# Patient Record
Sex: Female | Born: 1975 | Race: White | Hispanic: No | Marital: Married | Smoking: Former smoker
Health system: Southern US, Community
[De-identification: ages and names within clinical notes are randomized; demographics above are authoritative.]

## PROBLEM LIST (undated history)

## (undated) ENCOUNTER — Ambulatory Visit: Admission: EM | Disposition: A | Discharge status: Home / Self Care | Payer: 59

## (undated) ENCOUNTER — Inpatient Hospital Stay: Payer: Self-pay

## (undated) DIAGNOSIS — T4145XA Adverse effect of unspecified anesthetic, initial encounter: Secondary | ICD-10-CM

## (undated) DIAGNOSIS — T7840XA Allergy, unspecified, initial encounter: Secondary | ICD-10-CM

## (undated) DIAGNOSIS — F419 Anxiety disorder, unspecified: Secondary | ICD-10-CM

## (undated) DIAGNOSIS — Z9889 Other specified postprocedural states: Secondary | ICD-10-CM

## (undated) DIAGNOSIS — F329 Major depressive disorder, single episode, unspecified: Secondary | ICD-10-CM

## (undated) DIAGNOSIS — T8859XA Other complications of anesthesia, initial encounter: Secondary | ICD-10-CM

## (undated) DIAGNOSIS — D329 Benign neoplasm of meninges, unspecified: Secondary | ICD-10-CM

## (undated) DIAGNOSIS — F32A Depression, unspecified: Secondary | ICD-10-CM

## (undated) DIAGNOSIS — K219 Gastro-esophageal reflux disease without esophagitis: Secondary | ICD-10-CM

## (undated) DIAGNOSIS — R112 Nausea with vomiting, unspecified: Secondary | ICD-10-CM

## (undated) DIAGNOSIS — I10 Essential (primary) hypertension: Secondary | ICD-10-CM

## (undated) HISTORY — DX: Allergy, unspecified, initial encounter: T78.40XA

## (undated) HISTORY — DX: Anxiety disorder, unspecified: F41.9

## (undated) HISTORY — PX: BRAIN SURGERY: SHX531

## (undated) HISTORY — DX: Major depressive disorder, single episode, unspecified: F32.9

## (undated) HISTORY — PX: OTHER SURGICAL HISTORY: SHX169

## (undated) HISTORY — PX: CORONARY ARTERY BYPASS GRAFT: SHX141

## (undated) HISTORY — DX: Depression, unspecified: F32.A

## (undated) HISTORY — PX: ABDOMINAL HYSTERECTOMY: SHX81

## (undated) SURGERY — Surgical Case
Anesthesia: *Unknown

---

## 1998-04-18 HISTORY — PX: TUBAL LIGATION: SHX77

## 2000-09-13 ENCOUNTER — Ambulatory Visit (HOSPITAL_COMMUNITY): Admission: RE | Admit: 2000-09-13 | Discharge: 2000-09-13 | Payer: Self-pay | Admitting: Family Medicine

## 2001-05-27 ENCOUNTER — Emergency Department (HOSPITAL_COMMUNITY): Admission: EM | Admit: 2001-05-27 | Discharge: 2001-05-27 | Payer: Self-pay

## 2004-11-10 ENCOUNTER — Emergency Department: Payer: Self-pay | Admitting: General Practice

## 2005-09-06 ENCOUNTER — Ambulatory Visit: Payer: Self-pay | Admitting: Obstetrics & Gynecology

## 2014-07-25 ENCOUNTER — Encounter: Payer: Self-pay | Admitting: *Deleted

## 2014-10-13 ENCOUNTER — Encounter: Payer: Self-pay | Admitting: Family Medicine

## 2014-10-13 ENCOUNTER — Ambulatory Visit (INDEPENDENT_AMBULATORY_CARE_PROVIDER_SITE_OTHER): Payer: 59 | Admitting: Family Medicine

## 2014-10-13 VITALS — BP 100/64 | HR 60 | Temp 98.1°F | Resp 16 | Ht 66.0 in | Wt 181.0 lb

## 2014-10-13 DIAGNOSIS — R300 Dysuria: Secondary | ICD-10-CM

## 2014-10-13 DIAGNOSIS — F32A Depression, unspecified: Secondary | ICD-10-CM | POA: Insufficient documentation

## 2014-10-13 DIAGNOSIS — M722 Plantar fascial fibromatosis: Secondary | ICD-10-CM

## 2014-10-13 DIAGNOSIS — F329 Major depressive disorder, single episode, unspecified: Secondary | ICD-10-CM | POA: Insufficient documentation

## 2014-10-13 LAB — POCT URINALYSIS DIPSTICK
Bilirubin, UA: NEGATIVE
Blood, UA: NEGATIVE
Glucose, UA: NEGATIVE
Ketones, UA: NEGATIVE
Leukocytes, UA: NEGATIVE
Nitrite, UA: NEGATIVE
Protein, UA: NEGATIVE
Spec Grav, UA: 1.015
Urobilinogen, UA: 1
pH, UA: 6

## 2014-10-13 NOTE — Progress Notes (Signed)
Subjective:     Patient ID: Carol Mooney, female   DOB: [birth year], 39 y.o.   MRN: [id number]  HPI  Chief Complaint  Patient presents with  . Foot Pain    patient presents in office today with complaints of right foot pain for the past month and half, patient states that pain has been gradually getting worse and is located at heel of foot and on side of foot, no injury or accident related to pain  . Dysuria    patient states that she has had burning with urination for over the past several weeks.   States she drinks a lot of caffeine and has gained weight since stopping smoking. 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 Reports foot pain bothers even if she is not weight bearing. Has tried high dose ibuprofen and shoe inserts with little improvement.   Review of Systems  Constitutional: Negative for fever and chills.       Objective:   Physical Exam  Constitutional: She appears well-developed and well-nourished. No distress.  Cardiovascular:  Right pedal pulses intact  Musculoskeletal: She exhibits no edema.  Tender over her right plantar heel         Assessment:    1. Plantar fasciitis of right foot - Ambulatory referral to Podiatry  2. Dysuria - POCT urinalysis dipstick    Plan:    Decrease caffeine intake. May add ES Tylenol up to 3000 mg./day for pain relief esp.  at night pending podiatry referral.

## 2014-10-13 NOTE — Patient Instructions (Signed)
Add extra strength Tylenol up to 3000 mg./ day for pain. Decrease caffeine intake.

## 2014-11-11 ENCOUNTER — Encounter: Payer: Self-pay | Admitting: Family Medicine

## 2014-11-11 ENCOUNTER — Ambulatory Visit (INDEPENDENT_AMBULATORY_CARE_PROVIDER_SITE_OTHER): Payer: 59 | Admitting: Family Medicine

## 2014-11-11 VITALS — BP 102/70 | HR 71 | Temp 97.9°F | Resp 16 | Ht 65.0 in | Wt 178.2 lb

## 2014-11-11 DIAGNOSIS — R635 Abnormal weight gain: Secondary | ICD-10-CM | POA: Diagnosis not present

## 2014-11-11 DIAGNOSIS — R5382 Chronic fatigue, unspecified: Secondary | ICD-10-CM | POA: Diagnosis not present

## 2014-11-11 NOTE — Progress Notes (Signed)
Subjective:     Patient ID: Carol Mooney, female   DOB: [birth year], 39 y.o.   MRN: [id number]  HPI  Chief Complaint  Patient presents with  . Fatigue    Patient comes in office today to address concerns of fatigue. Patient reports that she has been dealing with this issue on/off for some time now and was unsure if it was related to her depression. Patient states that she sleeps up to 16hrs.   . Weight Gain    Patient would like to address recent weight gain in the past 3 months, patient reports that she has gone up 2 pant sizes in 90days and is unsure why. Patient states that she stays active and eating habits have not changed.   "I quit Cymbalta 3 months ago." States it was making her too sleepy. Denies significant depression. Concerned with increased weight despite working out at the gym for an hour 3 x week. States she has been experiencing swelling of her legs as well. 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 Accompanied by her daughter today.   Review of Systems  Respiratory:       Reports snoring  Genitourinary:       Reports heavy menses; hx of tubal ligation.  Psychiatric/Behavioral:       States she was on Zoloft for several years from the age of 30. Reports it quit working for her despite high dose.       Objective:   Physical Exam  Constitutional: She appears well-developed and well-nourished. No distress.  Cardiovascular: Normal rate and regular rhythm.   Pulmonary/Chest: Breath sounds normal.  Musculoskeletal: She exhibits no edema (of lower extremties).  Psychiatric: She has a normal mood and affect. Her behavior is normal.       Assessment:    1. Chronic fatigue - CBC with Differential/Platelet - Comprehensive metabolic panel - T4, free - TSH - Ferritin - Vit D  25 hydroxy   2. Weight gain     Plan:    Provided with Epworth screen to fill out with her husband. Further f/u pending labs. Consider trial on Wellbutrin.

## 2014-11-11 NOTE — Patient Instructions (Signed)
Complete Epworth screen with your husband. We will call you with your lab results.

## 2014-11-12 ENCOUNTER — Telehealth: Payer: Self-pay

## 2014-11-12 ENCOUNTER — Other Ambulatory Visit: Payer: Self-pay | Admitting: Family Medicine

## 2014-11-12 DIAGNOSIS — E559 Vitamin D deficiency, unspecified: Secondary | ICD-10-CM

## 2014-11-12 LAB — COMPREHENSIVE METABOLIC PANEL
ALT: 11 IU/L (ref 0–32)
AST: 10 IU/L (ref 0–40)
Albumin/Globulin Ratio: 2 (ref 1.1–2.5)
Albumin: 4.3 g/dL (ref 3.5–5.5)
Alkaline Phosphatase: 51 IU/L (ref 39–117)
BUN/Creatinine Ratio: 17 (ref 8–20)
BUN: 11 mg/dL (ref 6–20)
Bilirubin Total: 0.2 mg/dL (ref 0.0–1.2)
CO2: 23 mmol/L (ref 18–29)
Calcium: 9.4 mg/dL (ref 8.7–10.2)
Chloride: 102 mmol/L (ref 97–108)
Creatinine, Ser: 0.63 mg/dL (ref 0.57–1.00)
GFR calc Af Amer: 131 mL/min/{1.73_m2} (ref >59)
GFR calc non Af Amer: 113 mL/min/{1.73_m2} (ref >59)
Globulin, Total: 2.2 g/dL (ref 1.5–4.5)
Glucose: 88 mg/dL (ref 65–99)
Potassium: 4.6 mmol/L (ref 3.5–5.2)
Sodium: 140 mmol/L (ref 134–144)
Total Protein: 6.5 g/dL (ref 6.0–8.5)

## 2014-11-12 LAB — CBC WITH DIFFERENTIAL/PLATELET
Basophils Absolute: 0 10*3/uL (ref 0.0–0.2)
Basos: 1 %
EOS (ABSOLUTE): 0.1 10*3/uL (ref 0.0–0.4)
Eos: 2 %
Hematocrit: 39.3 % (ref 34.0–46.6)
Hemoglobin: 13 g/dL (ref 11.1–15.9)
Immature Grans (Abs): 0 10*3/uL (ref 0.0–0.1)
Immature Granulocytes: 0 %
Lymphocytes Absolute: 1.9 10*3/uL (ref 0.7–3.1)
Lymphs: 30 %
MCH: 30.8 pg (ref 26.6–33.0)
MCHC: 33.1 g/dL (ref 31.5–35.7)
MCV: 93 fL (ref 79–97)
Monocytes Absolute: 0.5 10*3/uL (ref 0.1–0.9)
Monocytes: 7 %
Neutrophils Absolute: 3.7 10*3/uL (ref 1.4–7.0)
Neutrophils: 60 %
Platelets: 303 10*3/uL (ref 150–379)
RBC: 4.22 x10E6/uL (ref 3.77–5.28)
RDW: 14.6 % (ref 12.3–15.4)
WBC: 6.2 10*3/uL (ref 3.4–10.8)

## 2014-11-12 LAB — T4, FREE: Free T4: 1.1 ng/dL (ref 0.82–1.77)

## 2014-11-12 LAB — TSH: TSH: 0.954 u[IU]/mL (ref 0.450–4.500)

## 2014-11-12 LAB — FERRITIN: Ferritin: 14 ng/mL — ABNORMAL LOW (ref 15–150)

## 2014-11-12 LAB — VITAMIN D 25 HYDROXY (VIT D DEFICIENCY, FRACTURES): Vit D, 25-Hydroxy: 12.7 ng/mL — ABNORMAL LOW (ref 30.0–100.0)

## 2014-11-12 MED ORDER — VITAMIN D (ERGOCALCIFEROL) 1.25 MG (50000 UNIT) PO CAPS: 50000.0000 [IU] | ORAL_CAPSULE | ORAL | Status: DC

## 2014-11-12 NOTE — Telephone Encounter (Signed)
[id number] 

## 2014-11-12 NOTE — Telephone Encounter (Signed)
-----   Message from Carmon Ginsberg, [place] sent at 11/12/2014  7:57 AM EDT ----- No anemia though iron stores are mildly low. Also Vitamin D is very low. Thyroid tests and remainder of metabolic profile is normal. Would recommend an iron sulfate 300-325 mg.daily for 6 weeks and prescription dose Vitamin D weekly for 12 weeks (I will send in). Continue going to the gym as you are doing but walk for 30 minutes at least on days you are not going to the gym. Recheck in 3 months.

## 2014-11-12 NOTE — Telephone Encounter (Signed)
Spoke with patient on phone and advised as below. Patient stated that at office visit you mentioned that if her "levels" were within normal range that you would consider changing antidepressant medication? Patient wants to know is that something that can be done or does she need to come back in office to discuss changing medication? Also in message below you had stated that you recommend patient to start iron sulfate 300-325mg , is this something patient can purchase over the counter? I wasn't sure because I didn't see Rx for it in chart. Please advise, Amparo Bristol

## 2014-11-13 ENCOUNTER — Other Ambulatory Visit: Payer: Self-pay | Admitting: Family Medicine

## 2014-11-13 DIAGNOSIS — F329 Major depressive disorder, single episode, unspecified: Secondary | ICD-10-CM

## 2014-11-13 DIAGNOSIS — F32A Depression, unspecified: Secondary | ICD-10-CM

## 2014-11-13 MED ORDER — BUPROPION HCL ER (SR) 150 MG PO TB12: 150.0000 mg | ORAL_TABLET | Freq: Two times a day (BID) | ORAL | Status: DC

## 2014-11-13 NOTE — Telephone Encounter (Signed)
Iron is over the counter. If she feels depression is coming back would try Wellbutrin. Does she wish to proceed?

## 2014-11-13 NOTE — Telephone Encounter (Signed)
LMTCB  aa 

## 2014-11-13 NOTE — Telephone Encounter (Signed)
Pt returned Ana's call. Thanks TNP

## 2014-11-13 NOTE — Telephone Encounter (Signed)
Pt advised. Pt would like to proceed with trying Wellbutrin. She uses [facility] still thank you-aa

## 2014-12-07 ENCOUNTER — Encounter: Payer: Self-pay | Admitting: *Deleted

## 2014-12-07 ENCOUNTER — Other Ambulatory Visit: Payer: Self-pay

## 2014-12-07 ENCOUNTER — Emergency Department
Admission: EM | Admit: 2014-12-07 | Discharge: 2014-12-08 | Disposition: A | Discharge status: Home / Self Care | Payer: 59 | Attending: Emergency Medicine | Admitting: Emergency Medicine

## 2014-12-07 ENCOUNTER — Emergency Department: Payer: 59

## 2014-12-07 DIAGNOSIS — Z87891 Personal history of nicotine dependence: Secondary | ICD-10-CM | POA: Diagnosis not present

## 2014-12-07 DIAGNOSIS — M25422 Effusion, left elbow: Secondary | ICD-10-CM | POA: Diagnosis not present

## 2014-12-07 DIAGNOSIS — Z79899 Other long term (current) drug therapy: Secondary | ICD-10-CM | POA: Diagnosis not present

## 2014-12-07 DIAGNOSIS — M25522 Pain in left elbow: Secondary | ICD-10-CM | POA: Diagnosis present

## 2014-12-07 LAB — CBC
HCT: 35.6 % (ref 35.0–47.0)
Hemoglobin: 11.7 g/dL — ABNORMAL LOW (ref 12.0–16.0)
MCH: 30.8 pg (ref 26.0–34.0)
MCHC: 32.9 g/dL (ref 32.0–36.0)
MCV: 93.8 fL (ref 80.0–100.0)
Platelets: 296 10*3/uL (ref 150–440)
RBC: 3.79 MIL/uL — ABNORMAL LOW (ref 3.80–5.20)
RDW: 14.7 % — ABNORMAL HIGH (ref 11.5–14.5)
WBC: 11.9 10*3/uL — ABNORMAL HIGH (ref 3.6–11.0)

## 2014-12-07 LAB — BASIC METABOLIC PANEL
Anion gap: 6 (ref 5–15)
BUN: 14 mg/dL (ref 6–20)
CO2: 24 mmol/L (ref 22–32)
Calcium: 8.7 mg/dL — ABNORMAL LOW (ref 8.9–10.3)
Chloride: 110 mmol/L (ref 101–111)
Creatinine, Ser: 0.7 mg/dL (ref 0.44–1.00)
GFR calc Af Amer: >60 mL/min (ref >60)
GFR calc non Af Amer: >60 mL/min (ref >60)
Glucose, Bld: 135 mg/dL — ABNORMAL HIGH (ref 65–99)
Potassium: 3.7 mmol/L (ref 3.5–5.1)
Sodium: 140 mmol/L (ref 135–145)

## 2014-12-07 LAB — TROPONIN I: Troponin I: <0.03 ng/mL (ref <0.031)

## 2014-12-07 NOTE — ED Notes (Signed)
Pt states she has been having left elbow swelling, warmth, and redness. She was seen at urgent care yesterday, dx with tennis elbow, given prednisone pack. Pt states she had chest pain last night while at rest, that has come and gone since last night. Feels a heaviness in her arm.

## 2014-12-07 NOTE — ED Notes (Signed)
Report to ati, rn. Pt to ultrasound.

## 2014-12-07 NOTE — ED Provider Notes (Addendum)
[facility] Emergency Department Provider Note  ____________________________________________  Time seen: Approximately 9:26 PM  I have reviewed the triage vital signs and the nursing notes.   HISTORY  Chief Complaint Arm Pain    HPI Carol Mooney is a 39 y.o. 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  This started several days ago after having blood drawn from her right arm for some basic labs.  She saw a urgent care provider yesterday who diagnosed her with tennis elbow and gave her prednisone taper.  The swelling is continued to get worse and the pain has persisted.  She said that last night she started having sharp chest pains that were mild but very unusual for her in the center of her chest.  They are currently gone but she does not know whether they are there because she is anxious and worried about her arm or whether it is something else.  She states that the pain in her left arm is both sharp and a feeling of heaviness.  The onset was gradual but is slowly worsening and is now moderate in severity.  Movement makes it worse and nothing makes it better.  The patient has never had any blood clots in her legs or her lungs, she is not taking any exogenous discharge and.  And she is a former smoker who quit more than one year ago.   Past Medical History  Diagnosis Date  . Depression   . Allergy     Patient Active Problem List   Diagnosis Date Noted  . Clinical depression 10/13/2014  . Problems influencing health status 10/13/2014    Past Surgical History  Procedure Laterality Date  . Tubal ligation  2000  . C sections      Current Outpatient Rx  Name  Route  Sig  Dispense  Refill  . ferrous sulfate 325 (65 FE) MG tablet   Oral   Take 325 mg by mouth daily with breakfast.         . predniSONE (STERAPRED UNI-PAK 21 TAB) 10 MG  (21) TBPK tablet   Oral   Take 1 tablet by mouth taper from 4 doses each day to 1 dose and stop.         . Vitamin D, Ergocalciferol, (DRISDOL) 50000 UNITS CAPS capsule   Oral   Take 1 capsule (50,000 Units total) by mouth every 7 (seven) days.   12 capsule   0   . acetaminophen (TYLENOL) 500 MG tablet   Oral   Take by mouth.         Marland Kitchen buPROPion (WELLBUTRIN SR) 150 MG 12 hr tablet   Oral   Take 1 tablet (150 mg total) by mouth 2 (two) times daily. Start at one pill for the first 3-5 days. Office visit in 2-4 weeks before you run out.   60 tablet   0   . ibuprofen (ADVIL,MOTRIN) 800 MG tablet   Oral   Take by mouth.         . traZODone (DESYREL) 50 MG tablet   Oral   Take by mouth.           Allergies Review of patient's allergies indicates no known allergies.  Family History  Problem Relation Age of Onset  . Coronary artery disease Father   . Depression Father   . Diabetes Father  insulin dependent  . Depression Brother   . Cancer Maternal Grandmother     breast  . Depression Sister     Social History Social History  Substance Use Topics  . Smoking status: Former Smoker    Quit date: 05/19/2013  . Smokeless tobacco: Never Used  . Alcohol Use: 0.0 oz/week    0 Standard drinks or equivalent per week     Comment: very rarely    Review of Systems Constitutional: No fever/chills Eyes: No visual changes. ENT: No sore throat. Cardiovascular: Denies chest pain. Respiratory: Denies shortness of breath. Gastrointestinal: No abdominal pain.  No nausea, no vomiting.  No diarrhea.  No constipation. Genitourinary: Negative for dysuria. Musculoskeletal: pain and swelling around her left elbow as described above. Skin: Negative for rash. Neurological: Negative for headaches, focal weakness or numbness.  10-point ROS otherwise negative.  ____________________________________________   PHYSICAL EXAM:  VITAL SIGNS: ED Triage Vitals  Enc Vitals Group      BP 12/07/14 1752 133/72 mmHg     Pulse Rate 12/07/14 1752 114     Resp 12/07/14 1752 18     Temp 12/07/14 1752 98.5 F (36.9 C)     Temp Source 12/07/14 1752 Oral     SpO2 12/07/14 1752 100 %     Weight 12/07/14 1752 176 lb (79.833 kg)     Height 12/07/14 1752 5\' 6"  (1.676 m)     Head Cir --      Peak Flow --      Pain Score 12/07/14 1758 5     Pain Loc --      Pain Edu? --      Excl. in [place]? --     Constitutional: Alert and oriented. Well appearing and in no acute distress. Eyes: Conjunctivae are normal. PERRL. EOMI. Head: Atraumatic. Nose: No congestion/rhinnorhea. Mouth/Throat: Mucous membranes are moist.  Oropharynx non-erythematous. Neck: No stridor.   Cardiovascular: Normal rate, regular rhythm. Grossly normal heart sounds.  Good peripheral circulation. Respiratory: Normal respiratory effort.  No retractions. Lungs CTAB. Gastrointestinal: Soft and nontender. No distention. No abdominal bruits. No CVA tenderness. Musculoskeletal: swelling, warmth, and tenderness around the medial aspect of her left elbow.  No pain with range of motion to suggest a septic joint.  Normal range of motion. Neurologic:  Normal speech and language. No gross focal neurologic deficits are appreciated.  Skin:  Skin is warm, dry and intact. No rash noted. Psychiatric: Mood and affect are normal. Speech and behavior are normal.  ____________________________________________   LABS (all labs ordered are listed, but only abnormal results are displayed)  Labs Reviewed  BASIC METABOLIC PANEL - Abnormal; Notable for the following:    Glucose, Bld 135 (*)    Calcium 8.7 (*)    All other components within normal limits  CBC - Abnormal; Notable for the following:    WBC 11.9 (*)    RBC 3.79 (*)    Hemoglobin 11.7 (*)    RDW 14.7 (*)    All other components within normal limits  TROPONIN I   ____________________________________________  EKG  ED ECG REPORT I, Maira Christon, the attending  physician, personally viewed and interpreted this ECG.  Date: 12/07/2014 EKG Time: 18:05 Rate: 101 Rhythm:sinus tachycardia QRS Axis: normal Intervals: normal ST/T Wave abnormalities: normal Conduction Disutrbances: none Narrative Interpretation: unremarkable  ____________________________________________  RADIOLOGY I, Soffia Doshier, personally viewed and evaluated these images (plain radiographs) as part of my medical decision making.   Dg Chest 2 View  12/07/2014   CLINICAL DATA:  Chest pain  EXAM: CHEST - 2 VIEW  COMPARISON:  None.  FINDINGS: The heart size and mediastinal contours are within normal limits. Both lungs are clear. The visualized skeletal structures are unremarkable.  IMPRESSION: No active disease.   Electronically Signed   By: Inez Catalina M.D.   On: 12/07/2014 18:47   US Venous Img Upper Uni Left  12/08/2014   CLINICAL DATA:  Left arm pain and swelling for 3 days. Pain near the elbow.  EXAM: LEFT UPPER EXTREMITY VENOUS DOPPLER ULTRASOUND  TECHNIQUE: Gray-scale sonography with graded compression, as well as color Doppler and duplex ultrasound were performed to evaluate the upper extremity deep venous system from the level of the subclavian vein and including the jugular, axillary, basilic, radial, ulnar and upper cephalic vein. Spectral Doppler was utilized to evaluate flow at rest and with distal augmentation maneuvers.  COMPARISON:  None.  FINDINGS: Contralateral Subclavian Vein: Respiratory phasicity is normal and symmetric with the symptomatic side. No evidence of thrombus. Normal compressibility.  Internal Jugular Vein: No evidence of thrombus. Normal compressibility, respiratory phasicity and response to augmentation.  Subclavian Vein: No evidence of thrombus. Normal compressibility, respiratory phasicity and response to augmentation.  Axillary Vein: No evidence of thrombus. Normal compressibility, respiratory phasicity and response to augmentation.  Cephalic Vein: No  evidence of thrombus. Normal compressibility, respiratory phasicity and response to augmentation.  Basilic Vein: No evidence of thrombus. Normal compressibility, respiratory phasicity and response to augmentation.  Brachial Veins: No evidence of thrombus. Normal compressibility, respiratory phasicity and response to augmentation.  Radial Veins: No evidence of thrombus. Normal compressibility, respiratory phasicity and response to augmentation.  Ulnar Veins: No evidence of thrombus. Normal compressibility, respiratory phasicity and response to augmentation.  Venous Reflux:  None visualized.  Other Findings: None visualized. No focal sonographic abnormality in the region of swelling in the medial elbow.  IMPRESSION: No evidence of deep venous thrombosis.   Electronically Signed   By: Jeb Levering M.D.   On: 12/08/2014 00:09    ____________________________________________   PROCEDURES  Procedure(s) performed: None  Critical Care performed: No ____________________________________________   INITIAL IMPRESSION / ASSESSMENT AND PLAN / ED COURSE  Pertinent labs & imaging results that were available during my care of the patient were reviewed by me and considered in my medical decision making (see chart for details).  The patient does not look like she has infection of the elbow and she does not find does not have a septic joint. she has a slight leukocytosis but she also just started on prednisone.  She is afebrile with normal vital signs. We willevaluate with an ultrasound to make sure she does not have a DVT in that upper extremity and reassess  ----------------------------------------- 12:43 AM on 12/08/2014 -----------------------------------------  Ultrasound was unremarkable.  I reassessed the patient and again, though she has some tenderness and a little bit of swelling, there is no evidence of infection.  I gave the patient the reassuring results and explained that she should follow-up  with Dr. Roland Rack as an outpatient.  She understands and agrees with the plan.  ____________________________________________  FINAL CLINICAL IMPRESSION(S) / ED DIAGNOSES  Left elbow pain  NEW MEDICATIONS STARTED DURING THIS VISIT:  New Prescriptions   No medications on file      Hinda Kehr, MD 12/08/14 [phone]

## 2014-12-08 NOTE — Discharge Instructions (Signed)
As we discussed, we did not identify a specific reason for your elbow pain and swelling, but it does not appear to be infected, and you do not have a blood clot of the left upper extremity.  We recommend that he continue taking the medication your previously prescribed, take over-the-counter Tylenol and/or ibuprofen as needed for pain, keep the extremity elevated whenever possible, and call Dr. Nicholaus Bloom office tomorrow to schedule the next available follow-up appointment for additional evaluation.  Your lab work was otherwise reassuring today.  If you develop new or worsening symptoms that concern you, please return to the emergency department.   Musculoskeletal Pain Musculoskeletal pain is muscle and boney aches and pains. These pains can occur in any part of the body. Your caregiver may treat you without knowing the cause of the pain. They may treat you if blood or urine tests, X-rays, and other tests were normal.  CAUSES There is often not a definite cause or reason for these pains. These pains may be caused by a type of germ (virus). The discomfort may also come from overuse. Overuse includes working out too hard when your body is not fit. Boney aches also come from weather changes. Bone is sensitive to atmospheric pressure changes. HOME CARE INSTRUCTIONS   Ask when your test results will be ready. Make sure you get your test results.  Only take over-the-counter or prescription medicines for pain, discomfort, or fever as directed by your caregiver. If you were given medications for your condition, do not drive, operate machinery or power tools, or sign legal documents for 24 hours. Do not drink alcohol. Do not take sleeping pills or other medications that may interfere with treatment.  Continue all activities unless the activities cause more pain. When the pain lessens, slowly resume normal activities. Gradually increase the intensity and duration of the activities or exercise.  During periods of  severe pain, bed rest may be helpful. Lay or sit in any position that is comfortable.  Putting ice on the injured area.  Put ice in a bag.  Place a towel between your skin and the bag.  Leave the ice on for 15 to 20 minutes, 3 to 4 times a day.  Follow up with your caregiver for continued problems and no reason can be found for the pain. If the pain becomes worse or does not go away, it may be necessary to repeat tests or do additional testing. Your caregiver may need to look further for a possible cause. SEEK IMMEDIATE MEDICAL CARE IF:  You have pain that is getting worse and is not relieved by medications.  You develop chest pain that is associated with shortness or breath, sweating, feeling sick to your stomach (nauseous), or throw up (vomit).  Your pain becomes localized to the abdomen.  You develop any new symptoms that seem different or that concern you. MAKE SURE YOU:   Understand these instructions.  Will watch your condition.  Will get help right away if you are not doing well or get worse. Document Released: 04/04/2005 Document Revised: 06/27/2011 Document Reviewed: 12/07/2012 [facility] Patient Information 2015 [place], [place]. This information is not intended to replace advice given to you by your health care provider. Make sure you discuss any questions you have with your health care provider.  Heat Therapy Heat therapy can help ease sore, stiff, injured, and tight muscles and joints. Heat relaxes your muscles, which may help ease your pain.  RISKS AND COMPLICATIONS If you have any of the  following conditions, do not use heat therapy unless your health care provider has approved:  Poor circulation.  Healing wounds or scarred skin in the area being treated.  Diabetes, heart disease, or high blood pressure.  Not being able to feel (numbness) the area being treated.  Unusual swelling of the area being treated.  Active infections.  Blood  clots.  Cancer.  Inability to communicate pain. This may include young children and people who have problems with their brain function (dementia).  Pregnancy. Heat therapy should only be used on old, pre-existing, or long-lasting (chronic) injuries. Do not use heat therapy on new injuries unless directed by your health care provider. HOW TO USE HEAT THERAPY There are several different kinds of heat therapy, including:  Moist heat pack.  Warm water bath.  Hot water bottle.  Electric heating pad.  Heated gel pack.  Heated wrap.  Electric heating pad. Use the heat therapy method suggested by your health care provider. Follow your health care provider's instructions on when and how to use heat therapy. GENERAL HEAT THERAPY RECOMMENDATIONS  Do not sleep while using heat therapy. Only use heat therapy while you are awake.  Your skin may turn pink while using heat therapy. Do not use heat therapy if your skin turns red.  Do not use heat therapy if you have new pain.  High heat or long exposure to heat can cause burns. Be careful when using heat therapy to avoid burning your skin.  Do not use heat therapy on areas of your skin that are already irritated, such as with a rash or sunburn. SEEK MEDICAL CARE IF:  You have blisters, redness, swelling, or numbness.  You have new pain.  Your pain is worse. MAKE SURE YOU:  Understand these instructions.  Will watch your condition.  Will get help right away if you are not doing well or get worse. Document Released: 06/27/2011 Document Revised: 08/19/2013 Document Reviewed: 05/28/2013 [facility] Patient Information 2015 [place], [place]. This information is not intended to replace advice given to you by your health care provider. Make sure you discuss any questions you have with your health care provider.

## 2015-03-22 ENCOUNTER — Encounter: Payer: Self-pay | Admitting: Gynecology

## 2015-03-22 ENCOUNTER — Ambulatory Visit
Admission: EM | Admit: 2015-03-22 | Discharge: 2015-03-22 | Disposition: A | Discharge status: Home / Self Care | Payer: 59 | Attending: Emergency Medicine | Admitting: Emergency Medicine

## 2015-03-22 DIAGNOSIS — J0100 Acute maxillary sinusitis, unspecified: Secondary | ICD-10-CM | POA: Diagnosis not present

## 2015-03-22 MED ORDER — AMOXICILLIN-POT CLAVULANATE 875-125 MG PO TABS: 1.0000 | ORAL_TABLET | Freq: Two times a day (BID) | ORAL | Status: DC

## 2015-03-22 MED ORDER — GUAIFENESIN-CODEINE 100-10 MG/5ML PO SOLN: 5.0000 mL | Freq: Three times a day (TID) | ORAL | Status: DC | PRN

## 2015-03-22 NOTE — ED Provider Notes (Signed)
[facility]  ____________________________________________  Time seen: Approximately 8:32 AM  I have reviewed the triage vital signs and the nursing notes.   HISTORY  Chief Complaint URI   HPI Carol Mooney is a 39 y.o. female presents with a complaint of 7-8 days of runny nose, nasal congestion, sinus pressure and intermittent cough. Patient reports that cough is primarily at night when lying down he can feel drainage in the back of her throat. Patient states that she frequently blows her nose as well as coughing up greenish phlegm. States low-grade temperature of 100.2 at home. 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  Reports continues to eat and drink well however with decreased appetite. States that she can fill congestion both in sinuses and chest. Denies chest pain or shortness of breath or abdominal pain. States current sinus discomfort is 3 out of 10 aching and pressure. Denies aggravating factors. Reports symptoms are unrelieved with over-the-counter Tylenol Cold and flu and NyQuil.   Past Medical History  Diagnosis Date  . Depression   . Allergy     Patient Active Problem List   Diagnosis Date Noted  . Clinical depression 10/13/2014  . Problems influencing health status 10/13/2014    Past Surgical History  Procedure Laterality Date  . Tubal ligation  2000  . C sections     LMP: November. Denies chance of pregnancy.   Current Outpatient Rx  Name  Route  Sig  Dispense  Refill  . ferrous sulfate 325 (65 FE) MG tablet   Oral   Take 325 mg by mouth daily with breakfast.         . ibuprofen (ADVIL,MOTRIN) 800 MG tablet   Oral   Take by mouth.         . traZODone (DESYREL) 50 MG tablet   Oral   Take by mouth.         Marland Kitchen acetaminophen (TYLENOL) 500 MG tablet   Oral   Take by mouth.         Marland Kitchen buPROPion (WELLBUTRIN SR) 150 MG 12 hr tablet   Oral  Take 1 tablet (150 mg total) by mouth 2 (two) times daily. Start at one pill for the first 3-5 days. Office visit in 2-4 weeks before you run out.   60 tablet   0   .           Marland Kitchen             Allergies Review of patient's allergies indicates no known allergies.  Family History  Problem Relation Age of Onset  . Coronary artery disease Father   . Depression Father   . Diabetes Father     insulin dependent  . Depression Brother   . Cancer Maternal Grandmother     breast  . Depression Sister     Social History Social History  Substance Use Topics  . Smoking status: Former Smoker    Quit date: 05/19/2013  . Smokeless tobacco: Never Used  . Alcohol Use: 0.0 oz/week    0 Standard drinks or equivalent per week     Comment: very rarely    Review of Systems Constitutional: Positive for report of fever at home. Eyes: No visual changes. ENT: No sore throat. Positive runny nose, nasal congestion, sinus pressure and cough. Cardiovascular: Denies chest pain. Respiratory: Denies shortness of breath. Gastrointestinal: No abdominal pain.  No nausea, no vomiting.  No diarrhea.  No constipation. Genitourinary: Negative for dysuria. Musculoskeletal: Negative for back pain. Skin: Negative for rash. Neurological: Negative for headaches, focal weakness or numbness.  10-point ROS otherwise negative.  ____________________________________________   PHYSICAL EXAM:  VITAL SIGNS: ED Triage Vitals  Enc Vitals Group     BP 03/22/15 0830 119/64 mmHg     Pulse Rate 03/22/15 0830 80     Resp 03/22/15 0830 16     Temp 03/22/15 0830 97.1 F (36.2 C)     Temp Source 03/22/15 0830 Tympanic     SpO2 03/22/15 0830 100 %     Weight 03/22/15 0830 178 lb (80.74 kg)     Height 03/22/15 0830 5\' 4"  (1.626 m)     Head Cir --      Peak Flow --      Pain Score 03/22/15 0829 3     Pain Loc --      Pain Edu? --      Excl. in [place]? --     Constitutional: Alert and oriented. Well appearing and in no  acute distress. Eyes: Conjunctivae are normal. PERRL. EOMI. Head: Atraumatic. Moderate tenderness to palpation bilateral maxillary sinuses, mild tenderness to palpation bilateral frontal sinuses. No swelling. No erythema.  Ears: no erythema, normal TMs bilaterally.   Nose:Nasal congestion with bilateral nasal turbinate erythema.  Mouth/Throat: Mucous membranes are moist.  Oropharynx non-erythematous.No tonsillar swelling or exudate.  Neck: No stridor.  No cervical spine tenderness to palpation. Hematological/Lymphatic/Immunilogical: No cervical lymphadenopathy. Cardiovascular: Normal rate, regular rhythm. Grossly normal heart sounds.  Good peripheral circulation. Respiratory: Normal respiratory effort.  No retractions. Lungs CTAB.No wheezes, rales or rhonchi. Dry intermittent cough in room.  Gastrointestinal: Soft and nontender.  Musculoskeletal: No lower or upper extremity tenderness nor edema.   Neurologic:  Normal speech and language. No gross focal neurologic deficits are appreciated. No gait instability. Skin:  Skin is warm, dry and intact. No rash noted. Psychiatric: Mood and affect are normal. Speech and behavior are normal.  ____________________________________________   LABS (all labs ordered are listed, but only abnormal results are displayed)  Labs Reviewed - No data to display   INITIAL IMPRESSION / ASSESSMENT AND PLAN / ED COURSE  Pertinent labs & imaging results that were available during my care of the patient were reviewed by me and considered in my medical decision making (see chart for details).  Very well-appearing patient. No acute distress. Presents for complaints of over a week every nose, nasal congestion and purulent nasal drainage with intermittent cough. Lung clear throughout. Abdomen soft and nontender. Moist mucous membranes. Will treat maxillary sinusitis with oral Augmentin, and when necessary guaifenesin codeine. Encouraged supportive treatments including  rest, fluids, when necessary over-the-counter Tylenol or ibuprofen.   Discussed follow up with Primary care physician this week. Discussed follow up and return parameters including no resolution or any worsening concerns. Patient verbalized understanding and agreed to plan.   ____________________________________________   FINAL CLINICAL IMPRESSION(S) / ED DIAGNOSES  Final diagnoses:  Acute maxillary sinusitis, recurrence not specified       Marylene Land, NP 03/22/15 [id number]  Marylene Land, NP 03/22/15 [id number]

## 2015-03-22 NOTE — Discharge Instructions (Signed)
Take medication as prescribed. Rest. Drink plenty of fluids.   Follow up with your primary care physician this week as needed. Return to Urgent as needed for new or worsening concerns.   Sinusitis, Adult Sinusitis is redness, soreness, and inflammation of the paranasal sinuses. Paranasal sinuses are air pockets within the bones of your face. They are located beneath your eyes, in the middle of your forehead, and above your eyes. In healthy paranasal sinuses, mucus is able to drain out, and air is able to circulate through them by way of your nose. However, when your paranasal sinuses are inflamed, mucus and air can become trapped. This can allow bacteria and other germs to grow and cause infection. Sinusitis can develop quickly and last only a short time (acute) or continue over a long period (chronic). Sinusitis that lasts for more than 12 weeks is considered chronic. CAUSES Causes of sinusitis include:  Allergies.  Structural abnormalities, such as displacement of the cartilage that separates your nostrils (deviated septum), which can decrease the air flow through your nose and sinuses and affect sinus drainage.  Functional abnormalities, such as when the small hairs (cilia) that line your sinuses and help remove mucus do not work properly or are not present. SIGNS AND SYMPTOMS Symptoms of acute and chronic sinusitis are the same. The primary symptoms are pain and pressure around the affected sinuses. Other symptoms include:  Upper toothache.  Earache.  Headache.  Bad breath.  Decreased sense of smell and taste.  A cough, which worsens when you are lying flat.  Fatigue.  Fever.  Thick drainage from your nose, which often is green and may contain pus (purulent).  Swelling and warmth over the affected sinuses. DIAGNOSIS Your health care provider will perform a physical exam. During your exam, your health care provider may perform any of the following to help determine if you  have acute sinusitis or chronic sinusitis:  Look in your nose for signs of abnormal growths in your nostrils (nasal polyps).  Tap over the affected sinus to check for signs of infection.  View the inside of your sinuses using an imaging device that has a light attached (endoscope). If your health care provider suspects that you have chronic sinusitis, one or more of the following tests may be recommended:  Allergy tests.  Nasal culture. A sample of mucus is taken from your nose, sent to a lab, and screened for bacteria.  Nasal cytology. A sample of mucus is taken from your nose and examined by your health care provider to determine if your sinusitis is related to an allergy. TREATMENT Most cases of acute sinusitis are related to a viral infection and will resolve on their own within 10 days. Sometimes, medicines are prescribed to help relieve symptoms of both acute and chronic sinusitis. These may include pain medicines, decongestants, nasal steroid sprays, or saline sprays. However, for sinusitis related to a bacterial infection, your health care provider will prescribe antibiotic medicines. These are medicines that will help kill the bacteria causing the infection. Rarely, sinusitis is caused by a fungal infection. In these cases, your health care provider will prescribe antifungal medicine. For some cases of chronic sinusitis, surgery is needed. Generally, these are cases in which sinusitis recurs more than 3 times per year, despite other treatments. HOME CARE INSTRUCTIONS  Drink plenty of water. Water helps thin the mucus so your sinuses can drain more easily.  Use a humidifier.  Inhale steam 3-4 times a day (for example, sit in  the bathroom with the shower running).  Apply a warm, moist washcloth to your face 3-4 times a day, or as directed by your health care provider.  Use saline nasal sprays to help moisten and clean your sinuses.  Take medicines only as directed by your health  care provider.  If you were prescribed either an antibiotic or antifungal medicine, finish it all even if you start to feel better. SEEK IMMEDIATE MEDICAL CARE IF:  You have increasing pain or severe headaches.  You have nausea, vomiting, or drowsiness.  You have swelling around your face.  You have vision problems.  You have a stiff neck.  You have difficulty breathing.   This information is not intended to replace advice given to you by your health care provider. Make sure you discuss any questions you have with your health care provider.   Document Released: 04/04/2005 Document Revised: 04/25/2014 Document Reviewed: 04/19/2011 Elsevier Interactive Patient Education [employer].

## 2015-03-22 NOTE — ED Notes (Signed)
Patient c/o coughing. Greenish-yellow mucous and low grade fever x yesterday.

## 2015-04-13 ENCOUNTER — Ambulatory Visit
Admission: EM | Admit: 2015-04-13 | Discharge: 2015-04-13 | Disposition: A | Discharge status: Home / Self Care | Payer: 59 | Attending: Family Medicine | Admitting: Family Medicine

## 2015-04-13 DIAGNOSIS — J0110 Acute frontal sinusitis, unspecified: Secondary | ICD-10-CM | POA: Diagnosis not present

## 2015-04-13 DIAGNOSIS — J0100 Acute maxillary sinusitis, unspecified: Secondary | ICD-10-CM | POA: Diagnosis not present

## 2015-04-13 MED ORDER — GUAIFENESIN-CODEINE 100-10 MG/5ML PO SOLN: 10.0000 mL | Freq: Every evening | ORAL | Status: DC | PRN

## 2015-04-13 MED ORDER — LEVOFLOXACIN 750 MG PO TABS: 750.0000 mg | ORAL_TABLET | Freq: Every day | ORAL | Status: AC

## 2015-04-13 MED ORDER — BENZONATATE 100 MG PO CAPS: 100.0000 mg | ORAL_CAPSULE | Freq: Three times a day (TID) | ORAL | Status: DC | PRN

## 2015-04-13 MED ORDER — PREDNISONE 20 MG PO TABS: 40.0000 mg | ORAL_TABLET | Freq: Every day | ORAL | Status: AC

## 2015-04-13 NOTE — ED Notes (Signed)
States has had cough/sinus symptoms x 4 weeks. Seen here 3 weeks ago and given Rx for Augmentin. Started to improve with that, but 1 week ago, symptoms worse. +slight productive cough. Green mucous from nose. Denies fever

## 2015-04-13 NOTE — Discharge Instructions (Signed)
Take medication as prescribed. Rest. Drink plenty of fluids.  Follow up with your primary care physician closely. Return to Urgent care for new or worsening concerns.   Sinusitis, Adult Sinusitis is redness, soreness, and inflammation of the paranasal sinuses. Paranasal sinuses are air pockets within the bones of your face. They are located beneath your eyes, in the middle of your forehead, and above your eyes. In healthy paranasal sinuses, mucus is able to drain out, and air is able to circulate through them by way of your nose. However, when your paranasal sinuses are inflamed, mucus and air can become trapped. This can allow bacteria and other germs to grow and cause infection. Sinusitis can develop quickly and last only a short time (acute) or continue over a long period (chronic). Sinusitis that lasts for more than 12 weeks is considered chronic. CAUSES Causes of sinusitis include:  Allergies.  Structural abnormalities, such as displacement of the cartilage that separates your nostrils (deviated septum), which can decrease the air flow through your nose and sinuses and affect sinus drainage.  Functional abnormalities, such as when the small hairs (cilia) that line your sinuses and help remove mucus do not work properly or are not present. SIGNS AND SYMPTOMS Symptoms of acute and chronic sinusitis are the same. The primary symptoms are pain and pressure around the affected sinuses. Other symptoms include:  Upper toothache.  Earache.  Headache.  Bad breath.  Decreased sense of smell and taste.  A cough, which worsens when you are lying flat.  Fatigue.  Fever.  Thick drainage from your nose, which often is green and may contain pus (purulent).  Swelling and warmth over the affected sinuses. DIAGNOSIS Your health care provider will perform a physical exam. During your exam, your health care provider may perform any of the following to help determine if you have acute sinusitis  or chronic sinusitis:  Look in your nose for signs of abnormal growths in your nostrils (nasal polyps).  Tap over the affected sinus to check for signs of infection.  View the inside of your sinuses using an imaging device that has a light attached (endoscope). If your health care provider suspects that you have chronic sinusitis, one or more of the following tests may be recommended:  Allergy tests.  Nasal culture. A sample of mucus is taken from your nose, sent to a lab, and screened for bacteria.  Nasal cytology. A sample of mucus is taken from your nose and examined by your health care provider to determine if your sinusitis is related to an allergy. TREATMENT Most cases of acute sinusitis are related to a viral infection and will resolve on their own within 10 days. Sometimes, medicines are prescribed to help relieve symptoms of both acute and chronic sinusitis. These may include pain medicines, decongestants, nasal steroid sprays, or saline sprays. However, for sinusitis related to a bacterial infection, your health care provider will prescribe antibiotic medicines. These are medicines that will help kill the bacteria causing the infection. Rarely, sinusitis is caused by a fungal infection. In these cases, your health care provider will prescribe antifungal medicine. For some cases of chronic sinusitis, surgery is needed. Generally, these are cases in which sinusitis recurs more than 3 times per year, despite other treatments. HOME CARE INSTRUCTIONS  Drink plenty of water. Water helps thin the mucus so your sinuses can drain more easily.  Use a humidifier.  Inhale steam 3-4 times a day (for example, sit in the bathroom with the shower  running).  Apply a warm, moist washcloth to your face 3-4 times a day, or as directed by your health care provider.  Use saline nasal sprays to help moisten and clean your sinuses.  Take medicines only as directed by your health care provider.  If  you were prescribed either an antibiotic or antifungal medicine, finish it all even if you start to feel better. SEEK IMMEDIATE MEDICAL CARE IF:  You have increasing pain or severe headaches.  You have nausea, vomiting, or drowsiness.  You have swelling around your face.  You have vision problems.  You have a stiff neck.  You have difficulty breathing.   This information is not intended to replace advice given to you by your health care provider. Make sure you discuss any questions you have with your health care provider.   Document Released: 04/04/2005 Document Revised: 04/25/2014 Document Reviewed: 04/19/2011 Elsevier Interactive Patient Education [employer].

## 2015-04-13 NOTE — ED Provider Notes (Signed)
[facility]  ____________________________________________  Time seen: Approximately 7:01 PM  I have reviewed the triage vital signs and the nursing notes.   HISTORY  Chief Complaint URI   HPI DENZIL MAGANA is a 39 y.o. female presents for complaints of runny nose, nasal congestion, sinus pressure and sinus drainage for the last 3-4 weeks. Patient reports that she was seen in urgent care 3 weeks ago and prescribed Augmentin, and reports that she felt better for approximately 3-4 days but no resolution. Patient reports that she continues with nasal drainage that is green and thick. Also reports bilateral ear discomfort and intermittent popping sensations. Denies sore throat. Reports intermittent cough. States cough is primarily at night when she's lying down and can feel the drainage in the back or throat.  Denies chest pain, shortness of breath, abdominal pain, dizziness, weakness, nausea, vomiting or diarrhea. Reports continues to eat and drink well. Denies fevers.  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     Past Medical History  Diagnosis Date  . Depression   . Allergy     Patient Active Problem List   Diagnosis Date Noted  . Clinical depression 10/13/2014  . Problems influencing health status 10/13/2014    Past Surgical History  Procedure Laterality Date  . Tubal ligation  2000  . C sections     Reports last menstrual. 3 weeks ago. Denies chance of pregnancy.  Current Outpatient Rx  Name  Route  Sig  Dispense  Refill  .           .           . traZODone (DESYREL) 50 MG tablet   Oral   Take by mouth.         .           . buPROPion (WELLBUTRIN SR) 150 MG 12 hr tablet   Oral   Take 1 tablet (150 mg total) by mouth 2 (two) times daily. Start at one pill for the first 3-5 days. Office visit in 2-4 weeks before you run out.   60 tablet   0   . ferrous sulfate 325 (65 FE) MG tablet   Oral   Take 325 mg  by mouth daily with breakfast.         .           .           . Vitamin D, Ergocalciferol, (DRISDOL) 50000 UNITS CAPS capsule   Oral   Take 1 capsule (50,000 Units total) by mouth every 7 (seven) days.   12 capsule   0     Allergies Review of patient's allergies indicates no known allergies.  Family History  Problem Relation Age of Onset  . Coronary artery disease Father   . Depression Father   . Diabetes Father     insulin dependent  . Depression Brother   . Cancer Maternal Grandmother     breast  . Depression Sister     Social History Social History  Substance Use Topics  . Smoking status: Former Smoker    Quit date: 05/19/2013  . Smokeless tobacco: Never Used  . Alcohol Use: 0.0 oz/week    0 Standard drinks or equivalent per week     Comment: very rarely    Review of Systems Constitutional: No fever/chills Eyes: No visual changes. ENT: No sore throat. Positive runny nose, nasal condition, sinus pressure and sinus  drainage. Positive intermittent cough. Cardiovascular: Denies chest pain. Respiratory: Denies shortness of breath. Gastrointestinal: No abdominal pain.  No nausea, no vomiting.  No diarrhea.  No constipation. Genitourinary: Negative for dysuria. Musculoskeletal: Negative for back pain. Skin: Negative for rash. Neurological: Negative for headaches, focal weakness or numbness.  10-point ROS otherwise negative.  ____________________________________________   PHYSICAL EXAM:  VITAL SIGNS: ED Triage Vitals  Enc Vitals Group     BP 04/13/15 1812 121/64 mmHg     Pulse Rate 04/13/15 1812 96     Resp 04/13/15 1812 17     Temp 04/13/15 1812 98.6 F (37 C)     Temp Source 04/13/15 1812 Tympanic     SpO2 04/13/15 1812 100 %     Weight 04/13/15 1812 178 lb (80.74 kg)     Height 04/13/15 1812 5\' 5"  (1.651 m)     Head Cir --      Peak Flow --      Pain Score 04/13/15 1816 7     Pain Loc --      Pain Edu? --      Excl. in [place]? --      Constitutional: Alert and oriented. Well appearing and in no acute distress. Eyes: Conjunctivae are normal. PERRL. EOMI. Head: Atraumatic. Moderate tenderness to palpation bilateral maxillary and frontal sinuses. No swelling. No erythema.  Ears: no erythema, normal TMs bilaterally.   Nose: Nasal congestion with bilateral nasal turbinate erythema and edema. Greenish nasal drainage.   Mouth/Throat: Mucous membranes are moist.  Oropharynx non-erythematous. No tonsillar swelling or exudate. Neck: No stridor.  No cervical spine tenderness to palpation. Hematological/Lymphatic/Immunilogical: No cervical lymphadenopathy. Cardiovascular: Normal rate, regular rhythm. Grossly normal heart sounds.  Good peripheral circulation. Respiratory: Normal respiratory effort.  No retractions. Lungs CTAB. No wheezes, rales or rhonchi. Good air movement. Dry intermittent cough in room. Gastrointestinal: Soft and nontender.  Musculoskeletal: No lower or upper extremity tenderness nor edema. No cervical, thoracic or lumbar tenderness to palpation.  Neurologic:  Normal speech and language. No gross focal neurologic deficits are appreciated. No gait instability. Skin:  Skin is warm, dry and intact. No rash noted. Psychiatric: Mood and affect are normal. Speech and behavior are normal.  ____________________________________________   LABS (all labs ordered are listed, but only abnormal results are displayed)  Labs Reviewed - No data to display ____________________________________________   INITIAL IMPRESSION / ASSESSMENT AND PLAN / ED COURSE  Pertinent labs & imaging results that were available during my care of the patient were reviewed by me and considered in my medical decision making (see chart for details).  Very well-appearing patient. No acute distress. Presents for the complaints of 3-4 weeks of runny nose, nasal congestion, sinus pressure and sinus drainage. Reports patient was treated 2-3 weeks ago  with oral Augmentin with slight improvement but then states it came right back and has not resolved. States not resolved with over-the-counter Tylenol and cough and cold congestion medicines. Denies fevers. Reports continues to eat and drink well. Lungs clear throughout. Abdomen soft and nontender. Moist mucous membranes. Moderate tenderness to palpation bilateral frontal and maxillary sinuses. Will treat frontal and maxillary sinusitis with oral Levaquin, prednisone 5 days, when necessary Tessalon Perles during the day as well as guaifenesin with codeine daily at bedtime as needed for cough. Encouraged rest, fluids, PCP follow up. Work note given for today and tomorrow.  Discussed follow up with Primary care physician this week. Discussed follow up and return parameters including no resolution or  any worsening concerns. Patient verbalized understanding and agreed to plan.   ____________________________________________   FINAL CLINICAL IMPRESSION(S) / ED DIAGNOSES  Final diagnoses:  Acute maxillary sinusitis, recurrence not specified  Acute frontal sinusitis, recurrence not specified       Marylene Land, NP 04/13/15 [id number]

## 2015-08-13 ENCOUNTER — Emergency Department: Payer: 59

## 2015-08-13 ENCOUNTER — Encounter: Payer: Self-pay | Admitting: Emergency Medicine

## 2015-08-13 ENCOUNTER — Emergency Department
Admission: EM | Admit: 2015-08-13 | Discharge: 2015-08-13 | Disposition: A | Discharge status: Home / Self Care | Payer: 59 | Attending: Emergency Medicine | Admitting: Emergency Medicine

## 2015-08-13 DIAGNOSIS — Z87891 Personal history of nicotine dependence: Secondary | ICD-10-CM | POA: Diagnosis not present

## 2015-08-13 DIAGNOSIS — Z79899 Other long term (current) drug therapy: Secondary | ICD-10-CM | POA: Insufficient documentation

## 2015-08-13 DIAGNOSIS — R109 Unspecified abdominal pain: Secondary | ICD-10-CM | POA: Diagnosis not present

## 2015-08-13 DIAGNOSIS — R1011 Right upper quadrant pain: Secondary | ICD-10-CM | POA: Insufficient documentation

## 2015-08-13 DIAGNOSIS — N3090 Cystitis, unspecified without hematuria: Secondary | ICD-10-CM | POA: Insufficient documentation

## 2015-08-13 LAB — CBC WITH DIFFERENTIAL/PLATELET
Basophils Absolute: 0.1 10*3/uL (ref 0–0.1)
Basophils Relative: 1 %
Eosinophils Absolute: 0.2 10*3/uL (ref 0–0.7)
Eosinophils Relative: 3 %
HCT: 37.5 % (ref 35.0–47.0)
Hemoglobin: 12.6 g/dL (ref 12.0–16.0)
Lymphocytes Relative: 35 %
Lymphs Abs: 2.2 10*3/uL (ref 1.0–3.6)
MCH: 31.1 pg (ref 26.0–34.0)
MCHC: 33.6 g/dL (ref 32.0–36.0)
MCV: 92.6 fL (ref 80.0–100.0)
Monocytes Absolute: 0.6 10*3/uL (ref 0.2–0.9)
Monocytes Relative: 10 %
Neutro Abs: 3.2 10*3/uL (ref 1.4–6.5)
Neutrophils Relative %: 51 %
Platelets: 323 10*3/uL (ref 150–440)
RBC: 4.06 MIL/uL (ref 3.80–5.20)
RDW: 13.6 % (ref 11.5–14.5)
WBC: 6.1 10*3/uL (ref 3.6–11.0)

## 2015-08-13 LAB — COMPREHENSIVE METABOLIC PANEL
ALT: 16 U/L (ref 14–54)
AST: 22 U/L (ref 15–41)
Albumin: 4.5 g/dL (ref 3.5–5.0)
Alkaline Phosphatase: 57 U/L (ref 38–126)
Anion gap: 8 (ref 5–15)
BUN: 11 mg/dL (ref 6–20)
CO2: 24 mmol/L (ref 22–32)
Calcium: 9.3 mg/dL (ref 8.9–10.3)
Chloride: 107 mmol/L (ref 101–111)
Creatinine, Ser: 0.72 mg/dL (ref 0.44–1.00)
GFR calc Af Amer: >60 mL/min (ref >60)
GFR calc non Af Amer: >60 mL/min (ref >60)
Glucose, Bld: 106 mg/dL — ABNORMAL HIGH (ref 65–99)
Potassium: 3.7 mmol/L (ref 3.5–5.1)
Sodium: 139 mmol/L (ref 135–145)
Total Bilirubin: 0.1 mg/dL — ABNORMAL LOW (ref 0.3–1.2)
Total Protein: 7.1 g/dL (ref 6.5–8.1)

## 2015-08-13 LAB — URINALYSIS COMPLETE WITH MICROSCOPIC (ARMC ONLY)
Bilirubin Urine: NEGATIVE
Glucose, UA: NEGATIVE mg/dL
Hgb urine dipstick: NEGATIVE
Ketones, ur: NEGATIVE mg/dL
Nitrite: NEGATIVE
Protein, ur: NEGATIVE mg/dL
Specific Gravity, Urine: 1.023 (ref 1.005–1.030)
pH: 5 (ref 5.0–8.0)

## 2015-08-13 LAB — LIPASE, BLOOD: Lipase: 22 U/L (ref 11–51)

## 2015-08-13 LAB — POCT PREGNANCY, URINE: Preg Test, Ur: NEGATIVE

## 2015-08-13 LAB — PREGNANCY, URINE: Preg Test, Ur: NEGATIVE

## 2015-08-13 MED ORDER — KETOROLAC TROMETHAMINE 30 MG/ML IJ SOLN
30.0000 mg | Freq: Once | INTRAMUSCULAR | Status: AC
  Administered 2015-08-13: 30 mg via INTRAVENOUS
  Filled 2015-08-13: qty 1

## 2015-08-13 MED ORDER — NITROFURANTOIN MACROCRYSTAL 100 MG PO CAPS: 100.0000 mg | ORAL_CAPSULE | Freq: Two times a day (BID) | ORAL | Status: DC

## 2015-08-13 MED ORDER — ONDANSETRON 4 MG PO TBDP: 4.0000 mg | ORAL_TABLET | Freq: Three times a day (TID) | ORAL | Status: DC | PRN

## 2015-08-13 NOTE — ED Provider Notes (Signed)
[facility] - [facility] Provider Note  ____________________________________________  Time seen: 7:30 AM  I have reviewed the triage vital signs and the nursing notes.   HISTORY  Chief Complaint Abdominal Pain    HPI TEAGHAN KOSTRZEWSKI is a 40 y.o. female who complains of right upper quadrant pain for 2-3 days, intermittent and aching. Initially was lasting for about 50 minutes of the time but over the last 24 hours seems to be lasting longer and longer. Not exacerbated by eating. No aggravating factors other than lying on the right side. No alleviating factors. Has some nausea but no vomiting, no change in bowel movements. No fever. Moderate intensity.     Past Medical History  Diagnosis Date  . Depression   . Allergy      Patient Active Problem List   Diagnosis Date Noted  . Clinical depression 10/13/2014  . Problems influencing health status 10/13/2014     Past Surgical History  Procedure Laterality Date  . Tubal ligation  2000  . C sections       Current Outpatient Rx  Name  Route  Sig  Dispense  Refill  . acetaminophen (TYLENOL) 500 MG tablet   Oral   Take by mouth.         . benzonatate (TESSALON PERLES) 100 MG capsule   Oral   Take 1 capsule (100 mg total) by mouth 3 (three) times daily as needed for cough.   15 capsule   0   . buPROPion (WELLBUTRIN SR) 150 MG 12 hr tablet   Oral   Take 1 tablet (150 mg total) by mouth 2 (two) times daily. Start at one pill for the first 3-5 days. Office visit in 2-4 weeks before you run out.   60 tablet   0   . ferrous sulfate 325 (65 FE) MG tablet   Oral   Take 325 mg by mouth daily with breakfast.         . guaiFENesin-codeine 100-10 MG/5ML syrup   Oral   Take 10 mLs by mouth at bedtime as needed for cough.   60 mL   0   . ibuprofen (ADVIL,MOTRIN) 800 MG tablet   Oral   Take by mouth.         . nitrofurantoin (MACRODANTIN) 100 MG capsule   Oral   Take 1 capsule  (100 mg total) by mouth 2 (two) times daily.   6 capsule   0   . ondansetron (ZOFRAN ODT) 4 MG disintegrating tablet   Oral   Take 1 tablet (4 mg total) by mouth every 8 (eight) hours as needed for nausea or vomiting.   20 tablet   0   . traZODone (DESYREL) 50 MG tablet   Oral   Take by mouth.         . Vitamin D, Ergocalciferol, (DRISDOL) 50000 UNITS CAPS capsule   Oral   Take 1 capsule (50,000 Units total) by mouth every 7 (seven) days.   12 capsule   0      Allergies Review of patient's allergies indicates no known allergies.   Family History  Problem Relation Age of Onset  . Coronary artery disease Father   . Depression Father   . Diabetes Father     insulin dependent  . Depression Brother   . Cancer Maternal Grandmother     breast  . Depression Sister     Social History Social History  Substance Use Topics  . Smoking  status: Former Smoker    Quit date: 05/19/2013  . Smokeless tobacco: Never Used  . Alcohol Use: 0.0 oz/week    0 Standard drinks or equivalent per week     Comment: very rarely    Review of Systems  Constitutional:   No fever or chills.  Eyes:   No vision changes.  ENT:   No sore throat. No rhinorrhea. Cardiovascular:   No chest pain. Respiratory:   No dyspnea or cough. Gastrointestinal:   Positive as above for abdominal pain without vomiting and diarrhea.  Genitourinary:   Negative for dysuria or difficulty urinating. Musculoskeletal:   Negative for focal pain or swelling Neurological:   Negative for headaches 10-point ROS otherwise negative.  ____________________________________________   PHYSICAL EXAM:  VITAL SIGNS: ED Triage Vitals  Enc Vitals Group     BP 08/13/15 0722 138/85 mmHg     Pulse Rate 08/13/15 0722 99     Resp 08/13/15 0722 20     Temp --      Temp src --      SpO2 08/13/15 0722 100 %     Weight 08/13/15 0722 178 lb (80.74 kg)     Height 08/13/15 0722 5\' 4"  (1.626 m)     Head Cir --      Peak Flow --       Pain Score 08/13/15 0723 7     Pain Loc --      Pain Edu? --      Excl. in [place]? --     Vital signs reviewed, nursing assessments reviewed.   Constitutional:   Alert and oriented. Well appearing and in no distress. Eyes:   No scleral icterus. No conjunctival pallor. PERRL. EOMI.  No nystagmus. ENT   Head:   Normocephalic and atraumatic.   Nose:   No congestion/rhinnorhea. No septal hematoma   Mouth/Throat:   MMM, no pharyngeal erythema. No peritonsillar mass.    Neck:   No stridor. No SubQ emphysema. No meningismus. Hematological/Lymphatic/Immunilogical:   No cervical lymphadenopathy. Cardiovascular:   RRR. Symmetric bilateral radial and DP pulses.  No murmurs.  Respiratory:   Normal respiratory effort without tachypnea nor retractions. Breath sounds are clear and equal bilaterally. No wheezes/rales/rhonchi. Gastrointestinal:   Soft with right upper quadrant tenderness. There is also suprapubic tenderness.. Non distended. There is no CVA tenderness.  No rebound, rigidity, or guarding. Genitourinary:   deferred Musculoskeletal:   Nontender with normal range of motion in all extremities. No joint effusions.  No lower extremity tenderness.  No edema. Neurologic:   Normal speech and language.  CN 2-10 normal. Motor grossly intact. No gross focal neurologic deficits are appreciated.  Skin:    Skin is warm, dry and intact. No rash noted.  No petechiae, purpura, or bullae.  ____________________________________________    LABS (pertinent positives/negatives) (all labs ordered are listed, but only abnormal results are displayed) Labs Reviewed  COMPREHENSIVE METABOLIC PANEL - Abnormal; Notable for the following:    Glucose, Bld 106 (*)    Total Bilirubin 0.1 (*)    All other components within normal limits  URINALYSIS COMPLETEWITH MICROSCOPIC ([facility] ONLY) - Abnormal; Notable for the following:    Color, Urine YELLOW (*)    APPearance HAZY (*)    Leukocytes, UA 1+ (*)     Bacteria, UA RARE (*)    Squamous Epithelial / LPF 0-5 (*)    All other components within normal limits  URINE CULTURE  LIPASE, BLOOD  CBC WITH DIFFERENTIAL/PLATELET  PREGNANCY, URINE  POCT PREGNANCY, URINE   ____________________________________________   EKG    ____________________________________________    RADIOLOGY  Ultrasound right upper quadrant unremarkable  ____________________________________________   PROCEDURES   ____________________________________________   INITIAL IMPRESSION / ASSESSMENT AND PLAN / ED COURSE  Pertinent labs & imaging results that were available during my care of the patient were reviewed by me and considered in my medical decision making (see chart for details).  Patient well appearing no acute distress. Vital signs unremarkable. We'll check labs urinalysis and ultrasound.  ----------------------------------------- 8:50 AM on 08/13/2015 -----------------------------------------  Workup essentially unremarkable, only significant for a small amount of white blood cells and leukocyte esterase on urinalysis. I'll send a urine culture and give the patient a prescription for a Macrobid course. Zofran for nausea. Follow up with primary care.Considering the patient's symptoms, medical history, and physical examination today, I have low suspicion for cholecystitis or biliary pathology, pancreatitis, perforation or bowel obstruction, hernia, intra-abdominal abscess, AAA or dissection, volvulus or intussusception, mesenteric ischemia, or appendicitis.       ____________________________________________   FINAL CLINICAL IMPRESSION(S) / ED DIAGNOSES  Final diagnoses:  RUQ abdominal pain  Cystitis       Portions of this note were generated with dragon dictation software. Dictation errors may occur despite best attempts at proofreading.   Carrie Mew, MD 08/13/15 [phone]

## 2015-08-13 NOTE — Discharge Instructions (Signed)
Abdominal Pain, Adult °Many things can cause abdominal pain. Usually, abdominal pain is not caused by a disease and will improve without treatment. It can often be observed and treated at home. Your health care provider will do a physical exam and possibly order blood tests and X-rays to help determine the seriousness of your pain. However, in many cases, more time must pass before a clear cause of the pain can be found. Before that point, your health care provider may not know if you need more testing or further treatment. °HOME CARE INSTRUCTIONS °Monitor your abdominal pain for any changes. The following actions may help to alleviate any discomfort you are experiencing: °· Only take over-the-counter or prescription medicines as directed by your health care provider. °· Do not take laxatives unless directed to do so by your health care provider. °· Try a clear liquid diet (broth, tea, or water) as directed by your health care provider. Slowly move to a bland diet as tolerated. °SEEK MEDICAL CARE IF: °· You have unexplained abdominal pain. °· You have abdominal pain associated with nausea or diarrhea. °· You have pain when you urinate or have a bowel movement. °· You experience abdominal pain that wakes you in the night. °· You have abdominal pain that is worsened or improved by eating food. °· You have abdominal pain that is worsened with eating fatty foods. °· You have a fever. °SEEK IMMEDIATE MEDICAL CARE IF: °· Your pain does not go away within 2 hours. °· You keep throwing up (vomiting). °· Your pain is felt only in portions of the abdomen, such as the right side or the left lower portion of the abdomen. °· You pass bloody or black tarry stools. °MAKE SURE YOU: °· Understand these instructions. °· Will watch your condition. °· Will get help right away if you are not doing well or get worse. °  °This information is not intended to replace advice given to you by your health care provider. Make sure you discuss  any questions you have with your health care provider. °  °Document Released: 01/12/2005 Document Revised: 12/24/2014 Document Reviewed: 12/12/2012 °Elsevier Interactive Patient Education ©2016 [employer] °Urinary Tract Infection °Urinary tract infections (UTIs) can develop anywhere along your urinary tract. Your urinary tract is your body's drainage system for removing wastes and extra water. Your urinary tract includes two kidneys, two ureters, a bladder, and a urethra. Your kidneys are a pair of bean-shaped organs. Each kidney is about the size of your fist. They are located below your ribs, one on each side of your spine. °CAUSES °Infections are caused by microbes, which are microscopic organisms, including fungi, viruses, and bacteria. These organisms are so small that they can only be seen through a microscope. Bacteria are the microbes that most commonly cause UTIs. °SYMPTOMS  °Symptoms of UTIs may vary by age and gender of the patient and by the location of the infection. Symptoms in young women typically include a frequent and intense urge to urinate and a painful, burning feeling in the bladder or urethra during urination. Older women and men are more likely to be tired, shaky, and weak and have muscle aches and abdominal pain. A fever may mean the infection is in your kidneys. Other symptoms of a kidney infection include pain in your back or sides below the ribs, nausea, and vomiting. °DIAGNOSIS °To diagnose a UTI, your caregiver will ask you about your symptoms. Your caregiver will also ask you to provide a urine sample. The   urine sample will be tested for bacteria and white blood cells. White blood cells are made by your body to help fight infection. °TREATMENT  °Typically, UTIs can be treated with medication. Because most UTIs are caused by a bacterial infection, they usually can be treated with the use of antibiotics. The choice of antibiotic and length of treatment depend on your symptoms and the  type of bacteria causing your infection. °HOME CARE INSTRUCTIONS °· If you were prescribed antibiotics, take them exactly as your caregiver instructs you. Finish the medication even if you feel better after you have only taken some of the medication. °· Drink enough water and fluids to keep your urine clear or pale yellow. °· Avoid caffeine, tea, and carbonated beverages. They tend to irritate your bladder. °· Empty your bladder often. Avoid holding urine for long periods of time. °· Empty your bladder before and after sexual intercourse. °· After a bowel movement, women should cleanse from front to back. Use each tissue only once. °SEEK MEDICAL CARE IF:  °· You have back pain. °· You develop a fever. °· Your symptoms do not begin to resolve within 3 days. °SEEK IMMEDIATE MEDICAL CARE IF:  °· You have severe back pain or lower abdominal pain. °· You develop chills. °· You have nausea or vomiting. °· You have continued burning or discomfort with urination. °MAKE SURE YOU:  °· Understand these instructions. °· Will watch your condition. °· Will get help right away if you are not doing well or get worse. °  °This information is not intended to replace advice given to you by your health care provider. Make sure you discuss any questions you have with your health care provider. °  °Document Released: 01/12/2005 Document Revised: 12/24/2014 Document Reviewed: 05/13/2011 °Elsevier Interactive Patient Education ©2016 Elsevier Inc. ° °

## 2015-08-13 NOTE — ED Notes (Signed)
Pt presents with RUQ pain for two days with nausea.

## 2015-08-15 LAB — URINE CULTURE

## 2015-09-01 ENCOUNTER — Other Ambulatory Visit: Payer: Self-pay | Admitting: Obstetrics and Gynecology

## 2015-09-01 DIAGNOSIS — N921 Excessive and frequent menstruation with irregular cycle: Secondary | ICD-10-CM | POA: Insufficient documentation

## 2015-09-01 DIAGNOSIS — N941 Unspecified dyspareunia: Secondary | ICD-10-CM | POA: Diagnosis not present

## 2015-09-01 DIAGNOSIS — Z124 Encounter for screening for malignant neoplasm of cervix: Secondary | ICD-10-CM | POA: Diagnosis not present

## 2015-09-01 DIAGNOSIS — Z01419 Encounter for gynecological examination (general) (routine) without abnormal findings: Secondary | ICD-10-CM | POA: Diagnosis not present

## 2015-09-01 DIAGNOSIS — R87612 Low grade squamous intraepithelial lesion on cytologic smear of cervix (LGSIL): Secondary | ICD-10-CM | POA: Diagnosis not present

## 2015-09-01 DIAGNOSIS — G8929 Other chronic pain: Secondary | ICD-10-CM | POA: Diagnosis not present

## 2015-09-01 DIAGNOSIS — Z1231 Encounter for screening mammogram for malignant neoplasm of breast: Secondary | ICD-10-CM

## 2015-09-01 DIAGNOSIS — Z1211 Encounter for screening for malignant neoplasm of colon: Secondary | ICD-10-CM | POA: Diagnosis not present

## 2015-09-01 DIAGNOSIS — R102 Pelvic and perineal pain: Secondary | ICD-10-CM | POA: Diagnosis not present

## 2015-09-01 DIAGNOSIS — N809 Endometriosis, unspecified: Secondary | ICD-10-CM | POA: Insufficient documentation

## 2015-09-07 ENCOUNTER — Other Ambulatory Visit: Payer: Self-pay | Admitting: Family Medicine

## 2015-09-07 ENCOUNTER — Telehealth: Payer: Self-pay

## 2015-09-07 DIAGNOSIS — F329 Major depressive disorder, single episode, unspecified: Secondary | ICD-10-CM

## 2015-09-07 DIAGNOSIS — F32A Depression, unspecified: Secondary | ICD-10-CM

## 2015-09-07 MED ORDER — BUPROPION HCL ER (SR) 150 MG PO TB12: 150.0000 mg | ORAL_TABLET | Freq: Two times a day (BID) | ORAL | Status: DC

## 2015-09-07 NOTE — Telephone Encounter (Signed)
Patient wanted to see if you will refill her Wellbutrin, patient states that last time she was in office you stated that you would refill drug and never did. Patient states that she feels that she is hitting rock bottom and needs medication filled. Please advise if office visit is needed? KW

## 2015-09-07 NOTE — Telephone Encounter (Signed)
Medication refilled. Appear it was printed out previously and patient may not have been received rx.

## 2015-09-07 NOTE — Telephone Encounter (Signed)
Called script to [facility] per patients request. Carol Mooney

## 2015-09-17 ENCOUNTER — Ambulatory Visit
Admission: RE | Admit: 2015-09-17 | Discharge: 2015-09-17 | Disposition: A | Discharge status: Home / Self Care | Payer: 59 | Source: Ambulatory Visit | Attending: Obstetrics and Gynecology | Admitting: Obstetrics and Gynecology

## 2015-09-17 ENCOUNTER — Other Ambulatory Visit: Payer: Self-pay | Admitting: Obstetrics and Gynecology

## 2015-09-17 DIAGNOSIS — Z1231 Encounter for screening mammogram for malignant neoplasm of breast: Secondary | ICD-10-CM

## 2015-09-22 DIAGNOSIS — N941 Unspecified dyspareunia: Secondary | ICD-10-CM | POA: Diagnosis not present

## 2015-09-22 DIAGNOSIS — R87612 Low grade squamous intraepithelial lesion on cytologic smear of cervix (LGSIL): Secondary | ICD-10-CM | POA: Diagnosis not present

## 2015-09-22 DIAGNOSIS — R102 Pelvic and perineal pain: Secondary | ICD-10-CM | POA: Diagnosis not present

## 2015-09-22 DIAGNOSIS — N921 Excessive and frequent menstruation with irregular cycle: Secondary | ICD-10-CM | POA: Diagnosis not present

## 2015-10-07 ENCOUNTER — Other Ambulatory Visit: Payer: 59

## 2015-10-13 ENCOUNTER — Other Ambulatory Visit: Payer: Self-pay | Admitting: Family Medicine

## 2015-10-13 DIAGNOSIS — F32A Depression, unspecified: Secondary | ICD-10-CM

## 2015-10-13 DIAGNOSIS — B029 Zoster without complications: Secondary | ICD-10-CM | POA: Diagnosis not present

## 2015-10-13 DIAGNOSIS — B0230 Zoster ocular disease, unspecified: Secondary | ICD-10-CM | POA: Diagnosis not present

## 2015-10-13 DIAGNOSIS — F329 Major depressive disorder, single episode, unspecified: Secondary | ICD-10-CM

## 2015-10-13 MED ORDER — BUPROPION HCL ER (SR) 150 MG PO TB12: 150.0000 mg | ORAL_TABLET | Freq: Two times a day (BID) | ORAL | Status: DC

## 2015-10-13 MED ORDER — TRAZODONE HCL 50 MG PO TABS: 50.0000 mg | ORAL_TABLET | Freq: Every evening | ORAL | Status: DC | PRN

## 2015-10-13 NOTE — Telephone Encounter (Signed)
Pt has appt on 10/14/15 and was dx with shingles today and would like to have medication refilled for one month and r/s her appt.  She would like the medication called into [facility] employee pharmacy.

## 2015-10-13 NOTE — Telephone Encounter (Signed)
I called patient and she states she went to [facility] urgent care today after getting off work and was diagnosed with Shingles. Patient plans to reschedule her appointment and wants to know if we can refill her medication for a month until she is able to come in.

## 2015-10-14 ENCOUNTER — Ambulatory Visit: Payer: 59 | Admitting: Family Medicine

## 2015-10-27 DIAGNOSIS — B0230 Zoster ocular disease, unspecified: Secondary | ICD-10-CM | POA: Diagnosis not present

## 2015-11-03 ENCOUNTER — Encounter
Admission: RE | Admit: 2015-11-03 | Discharge: 2015-11-03 | Disposition: A | Discharge status: Home / Self Care | Payer: 59 | Source: Ambulatory Visit | Attending: Obstetrics and Gynecology | Admitting: Obstetrics and Gynecology

## 2015-11-03 DIAGNOSIS — Z01812 Encounter for preprocedural laboratory examination: Secondary | ICD-10-CM | POA: Diagnosis not present

## 2015-11-03 HISTORY — DX: Other complications of anesthesia, initial encounter: T88.59XA

## 2015-11-03 HISTORY — DX: Adverse effect of unspecified anesthetic, initial encounter: T41.45XA

## 2015-11-03 LAB — BASIC METABOLIC PANEL
Anion gap: 5 (ref 5–15)
BUN: 7 mg/dL (ref 6–20)
CO2: 27 mmol/L (ref 22–32)
Calcium: 9.2 mg/dL (ref 8.9–10.3)
Chloride: 106 mmol/L (ref 101–111)
Creatinine, Ser: 0.79 mg/dL (ref 0.44–1.00)
GFR calc Af Amer: >60 mL/min (ref >60)
GFR calc non Af Amer: >60 mL/min (ref >60)
Glucose, Bld: 96 mg/dL (ref 65–99)
Potassium: 4 mmol/L (ref 3.5–5.1)
Sodium: 138 mmol/L (ref 135–145)

## 2015-11-03 LAB — TYPE AND SCREEN
ABO/RH(D): O NEG
Antibody Screen: NEGATIVE

## 2015-11-03 LAB — CBC
HCT: 39.1 % (ref 35.0–47.0)
Hemoglobin: 13.4 g/dL (ref 12.0–16.0)
MCH: 31.9 pg (ref 26.0–34.0)
MCHC: 34.3 g/dL (ref 32.0–36.0)
MCV: 92.9 fL (ref 80.0–100.0)
Platelets: 296 10*3/uL (ref 150–440)
RBC: 4.2 MIL/uL (ref 3.80–5.20)
RDW: 15.6 % — ABNORMAL HIGH (ref 11.5–14.5)
WBC: 6 10*3/uL (ref 3.6–11.0)

## 2015-11-03 NOTE — H&P (Signed)
[name] is a 40 y.o. female here for TAH / BSO and ureteral stenting . Pt her for a follow up CPP and known endometriosis . Pt pain has worsened and she has dyspareunia . Right pelvic pain > left . Menometorhagia with ferritin of 5 and embx = nl . Pap with LGSIL   Past Medical History:  has a past medical history of Anemia, unspecified; Chickenpox; Depression, unspecified; and Seasonal allergies. , endometriosis Past Surgical History:  has a past surgical history that includes Cesarean section and Tubal ligation., Laparoscopy  Family History: family history includes Arthritis in her father; Breast cancer in her maternal aunt, maternal grandmother, paternal aunt, and paternal grandmother; Diabetes type II in her father; Heart disease in her father; No Known Problems in her mother; Ovarian cancer in her paternal grandmother. Social History:  reports that she has quit smoking. She has never used smokeless tobacco. She reports that she drinks alcohol. She reports that she does not use illicit drugs. OB/GYN History:  OB History    Gravida Para Term Preterm AB TAB SAB Ectopic Multiple Living   6 2 2  4  4   2       Allergies: has No Known Allergies. Medications:  Current Outpatient Prescriptions:  .  acetaminophen (TYLENOL) 500 MG tablet, Take 1,000 mg by mouth every 6 (six) hours as needed for Pain., Disp: , Rfl:  .  ferrous sulfate 325 (65 FE) MG EC tablet, Take 1 tablet (325 mg total) by mouth 2 (two) times daily with meals., Disp: 60 tablet, Rfl: 6 .  polyethylene glycol (MIRALAX) powder, Take 17 g by mouth once daily. Mix in 4-8ounces of fluid prior to taking., Disp: 255 g, Rfl: 11 .  traZODone (DESYREL) 50 MG tablet, Take 50 mg by mouth nightly as needed.  , Disp: , Rfl:   Review of Systems: General:                                          No fatigue or weight loss Eyes:                                                         No vision changes Ears:                                                           No hearing difficulty Respiratory:                No cough or shortness of breath Pulmonary:                                      No asthma or shortness of breath Cardiovascular:                     No chest pain, palpitations, dyspnea on exertion Gastrointestinal:  No abdominal bloating, chronic diarrhea, constipations, masses, pain or hematochezia Genitourinary:                                 No hematuria, dysuria, abnormal vaginal discharge,++ pelvic pain, + endometriosis, +Menometrorrhagia Lymphatic:                                       No swollen lymph nodes Musculoskeletal:                   No muscle weakness Neurologic:                                      No extremity weakness, syncope, seizure disorder Psychiatric:                                      No history of depression, delusions or suicidal/homicidal ideation    Exam:      Vitals:   09/22/15 1057  BP: 110/82  Pulse: 80    Body mass index is 31.53 kg/(m^2).  WDWN white/  female in NAD   Lungs: CTA  CV : RRR without murmur   Abdomen: soft , no mass, normal active bowel sounds,  non-tender, no rebound tenderness Pelvic: tanner stage 5 ,  External genitalia: vulva /labia no lesions Urethra: no prolapse Vagina: normal physiologic d/c Cervix: no lesions, no cervical motion tenderness   Uterus: normal size shape and contour, non-tender Adnexa: no mass,  non-tender   Rectovaginal: no mass  SIS test today : small 2 cm anterior fibroid . Simple cyst 1.3 cm on left . No evidence of endometrial pathology with saline injection   Impression:   The primary encounter diagnosis was Chronic pelvic pain in female. Diagnoses of Dyspareunia in female, Menorrhagia with irregular cycle, and LGSIL on Pap smear of cervix were also pertinent to this visit.    Plan:   I have spoken with the patient regarding treatment options including expectant management, hormonal  options, or surgical intervention. After a full discussion the pt elects to proceed with TAH/ BSO and ureteral stenting given the history of endometriosis Pt is aware of the potential risk of organ injury given her h/o endometriosis. Surgical menopause discussed with the pt

## 2015-11-03 NOTE — Patient Instructions (Signed)
  Your procedure is scheduled on: 11/09/15 Mon Report to Same Day Surgery 2nd floor medical mall To find out your arrival time please call [phone] between 1PM - 3PM on 11/06/15 Fri  Remember: Instructions that are not followed completely may result in serious medical risk, up to and including death, or upon the discretion of your surgeon and anesthesiologist your surgery may need to be rescheduled.    _x___ 1. Do not eat food or drink liquids after midnight. No gum chewing or hard candies.     __x__ 2. No Alcohol for 24 hours before or after surgery.   ____3. No Smoking for 24 prior to surgery.   ____  4. Bring all medications with you on the day of surgery if instructed.    __x__ 5. Notify your doctor if there is any change in your medical condition     (cold, fever, infections).     Do not wear jewelry, make-up, hairpins, clips or nail polish.  Do not wear lotions, powders, or perfumes. You may wear deodorant.  Do not shave 48 hours prior to surgery. Men may shave face and neck.  Do not bring valuables to the hospital.    [facility] is not responsible for any belongings or valuables.               Contacts, dentures or bridgework may not be worn into surgery.  Leave your suitcase in the car. After surgery it may be brought to your room.  For patients admitted to the hospital, discharge time is determined by your treatment team.   Patients discharged the day of surgery will not be allowed to drive home.    Please read over the following fact sheets that you were given:   [facility] Preparing for Surgery and or MRSA Information   _x___ Take these medicines the morning of surgery with A SIP OF WATER:    1. buPROPion (WELLBUTRIN SR) 150 MG 12 hr tablet  2.  3.  4.  5.  6.  ____ Fleet Enema (as directed)   _x___ Use CHG Soap or sage wipes as directed on instruction sheet   ____ Use inhalers on the day of surgery and bring to hospital day of surgery  ____ Stop  metformin 2 days prior to surgery    ____ Take 1/2 of usual insulin dose the night before surgery and none on the morning of           surgery.   ____ Stop aspirin or coumadin, or plavix  _x__ Stop Anti-inflammatories such as Advil, Aleve, Ibuprofen, Motrin, Naproxen,          Naprosyn, Goodies powders or aspirin products. Ok to take Tylenol.   ____ Stop supplements until after surgery.    ____ Bring C-Pap to the hospital.

## 2015-11-05 ENCOUNTER — Encounter: Payer: Self-pay | Admitting: Family Medicine

## 2015-11-05 ENCOUNTER — Ambulatory Visit (INDEPENDENT_AMBULATORY_CARE_PROVIDER_SITE_OTHER): Payer: 59 | Admitting: Family Medicine

## 2015-11-05 VITALS — BP 92/60 | HR 100 | Temp 99.0°F | Resp 16 | Wt 172.0 lb

## 2015-11-05 DIAGNOSIS — B009 Herpesviral infection, unspecified: Secondary | ICD-10-CM | POA: Diagnosis not present

## 2015-11-05 DIAGNOSIS — F329 Major depressive disorder, single episode, unspecified: Secondary | ICD-10-CM | POA: Diagnosis not present

## 2015-11-05 DIAGNOSIS — F32A Depression, unspecified: Secondary | ICD-10-CM

## 2015-11-05 MED ORDER — BUPROPION HCL ER (SR) 150 MG PO TB12: 150.0000 mg | ORAL_TABLET | Freq: Two times a day (BID) | ORAL | Status: DC

## 2015-11-05 MED ORDER — VALACYCLOVIR HCL 1 G PO TABS: ORAL_TABLET | ORAL | Status: DC

## 2015-11-05 MED ORDER — TRAZODONE HCL 50 MG PO TABS: 50.0000 mg | ORAL_TABLET | Freq: Every evening | ORAL | Status: DC | PRN

## 2015-11-05 NOTE — Patient Instructions (Signed)
Good luck with your surgery!

## 2015-11-05 NOTE — Progress Notes (Signed)
Subjective:     Patient ID: Carol Mooney, female   DOB: 11-Dec-1975, 40 y.o.   MRN: [id number]  HPI  Chief Complaint  Patient presents with  . Depression    Pt comes in for medication refills. Currently taking Wellbutrin and Trazodone.  . Mouth Lesions    Pt also c/o cold sores, and is requesting medication for this problem.  States she is pending total hysterectomy for vaginal bleeding and endometriosis. Reports depression under control with medication. Wishes to refill trazodone to help with sleep as having to change shifts frequently. Reports increased frequency of cold sores and wishes further treatment. Current cold sore is a week old.   Review of Systems     Objective:   Physical Exam  Constitutional: She appears well-developed and well-nourished. No distress.  Skin:  Resolving cold sore on lower lip.  Psychiatric: She has a normal mood and affect. Her behavior is normal.       Assessment:    1. Clinical depression: with insomnia - traZODone (DESYREL) 50 MG tablet; Take 1 tablet (50 mg total) by mouth at bedtime as needed for sleep (as needed for sleep).  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 - valACYclovir (VALTREX) 1000 MG tablet; Two pills every 12 hours for one day at onset of cold sore  Dispense: 4 tablet; Refill: 5  3. Depression - buPROPion (WELLBUTRIN SR) 150 MG 12 hr tablet; Take 1 tablet (150 mg total) by mouth 2 (two) times daily.  Dispense: 60 tablet; Refill: 5    Plan:    f/u in 6 months or as needed

## 2015-11-09 MED ORDER — SOD CITRATE-CITRIC ACID 500-334 MG/5ML PO SOLN
30.0000 mL | ORAL | Status: AC
  Filled 2015-11-09: qty 30

## 2015-11-09 MED ORDER — FAMOTIDINE 20 MG PO TABS
ORAL_TABLET | ORAL | Status: AC
  Filled 2015-11-09: qty 1

## 2015-11-09 MED ORDER — FAMOTIDINE 20 MG PO TABS
20.0000 mg | ORAL_TABLET | Freq: Once | ORAL | Status: AC
Start: 2015-11-09 — End: 2015-12-07
  Administered 2015-12-07: 20 mg via ORAL

## 2015-11-09 MED ORDER — LACTATED RINGERS IV SOLN: INTRAVENOUS | Status: DC

## 2015-11-09 MED ORDER — LACTATED RINGERS IV SOLN
INTRAVENOUS | Status: DC
  Administered 2015-12-07: 10:00:00 via INTRAVENOUS
  Administered 2015-12-07: 1000 mL via INTRAVENOUS

## 2015-11-09 MED ORDER — CEFOXITIN SODIUM-DEXTROSE 2-2.2 GM-% IV SOLR (PREMIX)
INTRAVENOUS | Status: AC
  Filled 2015-11-09: qty 50

## 2015-11-09 MED ORDER — CEFOXITIN SODIUM-DEXTROSE 2-2.2 GM-% IV SOLR (PREMIX): 2.0000 g | INTRAVENOUS | Status: AC

## 2015-12-07 ENCOUNTER — Inpatient Hospital Stay
Admission: RE | Admit: 2015-12-07 | Discharge: 2015-12-10 | DRG: 743 | Disposition: A | Discharge status: Home / Self Care | Payer: 59 | Source: Ambulatory Visit | Attending: Obstetrics and Gynecology | Admitting: Obstetrics and Gynecology

## 2015-12-07 ENCOUNTER — Encounter
Admission: RE | Disposition: A | Discharge status: Home / Self Care | Payer: Self-pay | Source: Ambulatory Visit | Attending: Obstetrics and Gynecology

## 2015-12-07 ENCOUNTER — Inpatient Hospital Stay: Payer: 59 | Admitting: Anesthesiology

## 2015-12-07 ENCOUNTER — Encounter: Payer: Self-pay | Admitting: *Deleted

## 2015-12-07 DIAGNOSIS — R102 Pelvic and perineal pain: Secondary | ICD-10-CM | POA: Diagnosis present

## 2015-12-07 DIAGNOSIS — Z8249 Family history of ischemic heart disease and other diseases of the circulatory system: Secondary | ICD-10-CM | POA: Diagnosis not present

## 2015-12-07 DIAGNOSIS — N801 Endometriosis of ovary: Principal | ICD-10-CM | POA: Diagnosis present

## 2015-12-07 DIAGNOSIS — Z803 Family history of malignant neoplasm of breast: Secondary | ICD-10-CM

## 2015-12-07 DIAGNOSIS — D509 Iron deficiency anemia, unspecified: Secondary | ICD-10-CM | POA: Diagnosis present

## 2015-12-07 DIAGNOSIS — N879 Dysplasia of cervix uteri, unspecified: Secondary | ICD-10-CM | POA: Diagnosis present

## 2015-12-07 DIAGNOSIS — N831 Corpus luteum cyst of ovary, unspecified side: Secondary | ICD-10-CM | POA: Diagnosis not present

## 2015-12-07 DIAGNOSIS — G8929 Other chronic pain: Secondary | ICD-10-CM | POA: Diagnosis not present

## 2015-12-07 DIAGNOSIS — N941 Unspecified dyspareunia: Secondary | ICD-10-CM | POA: Diagnosis present

## 2015-12-07 DIAGNOSIS — R87612 Low grade squamous intraepithelial lesion on cytologic smear of cervix (LGSIL): Secondary | ICD-10-CM | POA: Diagnosis present

## 2015-12-07 DIAGNOSIS — J302 Other seasonal allergic rhinitis: Secondary | ICD-10-CM | POA: Diagnosis present

## 2015-12-07 DIAGNOSIS — N921 Excessive and frequent menstruation with irregular cycle: Secondary | ICD-10-CM | POA: Diagnosis not present

## 2015-12-07 DIAGNOSIS — Z87891 Personal history of nicotine dependence: Secondary | ICD-10-CM | POA: Diagnosis not present

## 2015-12-07 DIAGNOSIS — N800 Endometriosis of uterus: Secondary | ICD-10-CM | POA: Diagnosis not present

## 2015-12-07 DIAGNOSIS — Z9889 Other specified postprocedural states: Secondary | ICD-10-CM

## 2015-12-07 DIAGNOSIS — Q504 Embryonic cyst of fallopian tube: Secondary | ICD-10-CM | POA: Diagnosis not present

## 2015-12-07 DIAGNOSIS — Z79899 Other long term (current) drug therapy: Secondary | ICD-10-CM | POA: Diagnosis not present

## 2015-12-07 DIAGNOSIS — Z833 Family history of diabetes mellitus: Secondary | ICD-10-CM

## 2015-12-07 DIAGNOSIS — N920 Excessive and frequent menstruation with regular cycle: Secondary | ICD-10-CM | POA: Diagnosis present

## 2015-12-07 DIAGNOSIS — N72 Inflammatory disease of cervix uteri: Secondary | ICD-10-CM | POA: Diagnosis not present

## 2015-12-07 DIAGNOSIS — N805 Endometriosis of intestine: Secondary | ICD-10-CM | POA: Diagnosis not present

## 2015-12-07 DIAGNOSIS — Z8261 Family history of arthritis: Secondary | ICD-10-CM

## 2015-12-07 DIAGNOSIS — N809 Endometriosis, unspecified: Secondary | ICD-10-CM | POA: Diagnosis not present

## 2015-12-07 DIAGNOSIS — N8301 Follicular cyst of right ovary: Secondary | ICD-10-CM | POA: Diagnosis not present

## 2015-12-07 DIAGNOSIS — N8302 Follicular cyst of left ovary: Secondary | ICD-10-CM | POA: Diagnosis not present

## 2015-12-07 HISTORY — PX: OOPHORECTOMY: SHX6387

## 2015-12-07 HISTORY — DX: Other specified postprocedural states: Z98.890

## 2015-12-07 HISTORY — PX: HYSTERECTOMY ABDOMINAL WITH SALPINGECTOMY: SHX6725

## 2015-12-07 HISTORY — DX: Nausea with vomiting, unspecified: R11.2

## 2015-12-07 HISTORY — PX: UTERINE STENT PLACEMENT: SHX6655

## 2015-12-07 LAB — TYPE AND SCREEN
ABO/RH(D): O NEG
Antibody Screen: NEGATIVE

## 2015-12-07 LAB — POCT PREGNANCY, URINE: Preg Test, Ur: NEGATIVE

## 2015-12-07 SURGERY — HYSTERECTOMY, TOTAL, ABDOMINAL, WITH SALPINGECTOMY
Anesthesia: General | Laterality: Bilateral | Wound class: Clean Contaminated

## 2015-12-07 MED ORDER — PROPOFOL 10 MG/ML IV BOLUS
INTRAVENOUS | Status: DC | PRN
  Administered 2015-12-07: 150 mg via INTRAVENOUS

## 2015-12-07 MED ORDER — FENTANYL CITRATE (PF) 100 MCG/2ML IJ SOLN
INTRAMUSCULAR | Status: AC
  Administered 2015-12-07: 50 ug via INTRAVENOUS
  Filled 2015-12-07: qty 2

## 2015-12-07 MED ORDER — FENTANYL CITRATE (PF) 100 MCG/2ML IJ SOLN
25.0000 ug | INTRAMUSCULAR | Status: DC | PRN
  Administered 2015-12-07 (×2): 50 ug via INTRAVENOUS

## 2015-12-07 MED ORDER — HYDROMORPHONE HCL 1 MG/ML IJ SOLN
INTRAMUSCULAR | Status: AC
  Administered 2015-12-07: 0.5 mg via INTRAVENOUS
  Filled 2015-12-07: qty 1

## 2015-12-07 MED ORDER — DEXAMETHASONE SODIUM PHOSPHATE 10 MG/ML IJ SOLN
INTRAMUSCULAR | Status: DC | PRN
  Administered 2015-12-07: 10 mg via INTRAVENOUS

## 2015-12-07 MED ORDER — DIPHENHYDRAMINE HCL 50 MG/ML IJ SOLN
INTRAMUSCULAR | Status: DC | PRN
  Administered 2015-12-07: 10 mg via INTRAVENOUS

## 2015-12-07 MED ORDER — ACETAMINOPHEN 10 MG/ML IV SOLN
INTRAVENOUS | Status: AC
  Filled 2015-12-07: qty 100

## 2015-12-07 MED ORDER — MORPHINE SULFATE 2 MG/ML IV SOLN
INTRAVENOUS | Status: DC
  Administered 2015-12-07: 16.1 mg via INTRAVENOUS
  Administered 2015-12-08: 01:00:00 via INTRAVENOUS
  Administered 2015-12-08: 12.08 mg via INTRAVENOUS
  Administered 2015-12-08: 7.76 mg via INTRAVENOUS
  Administered 2015-12-08: 18.7 mg via INTRAVENOUS
  Filled 2015-12-07: qty 25

## 2015-12-07 MED ORDER — ACETAMINOPHEN 10 MG/ML IV SOLN
INTRAVENOUS | Status: DC | PRN
  Administered 2015-12-07: 1000 mg via INTRAVENOUS

## 2015-12-07 MED ORDER — ONDANSETRON HCL 4 MG/2ML IJ SOLN
INTRAMUSCULAR | Status: DC | PRN
  Administered 2015-12-07: 4 mg via INTRAVENOUS

## 2015-12-07 MED ORDER — SODIUM CHLORIDE 0.9 % IJ SOLN
INTRAMUSCULAR | Status: AC
  Filled 2015-12-07: qty 10

## 2015-12-07 MED ORDER — PROMETHAZINE HCL 25 MG/ML IJ SOLN
25.0000 mg | Freq: Four times a day (QID) | INTRAMUSCULAR | Status: DC | PRN
  Administered 2015-12-07 (×2): 25 mg via INTRAVENOUS
  Filled 2015-12-07: qty 1

## 2015-12-07 MED ORDER — HYDROMORPHONE HCL 1 MG/ML IJ SOLN: 0.2500 mg | INTRAMUSCULAR | Status: DC | PRN

## 2015-12-07 MED ORDER — ONDANSETRON HCL 4 MG/2ML IJ SOLN: 4.0000 mg | Freq: Four times a day (QID) | INTRAMUSCULAR | Status: DC | PRN

## 2015-12-07 MED ORDER — FENTANYL CITRATE (PF) 100 MCG/2ML IJ SOLN
INTRAMUSCULAR | Status: DC | PRN
  Administered 2015-12-07 (×6): 50 ug via INTRAVENOUS

## 2015-12-07 MED ORDER — CEFOXITIN SODIUM-DEXTROSE 2-2.2 GM-% IV SOLR (PREMIX)
INTRAVENOUS | Status: AC
  Administered 2015-12-07: 2000 mg
  Filled 2015-12-07: qty 50

## 2015-12-07 MED ORDER — ONDANSETRON HCL 4 MG PO TABS: 4.0000 mg | ORAL_TABLET | Freq: Four times a day (QID) | ORAL | Status: DC | PRN

## 2015-12-07 MED ORDER — OXYCODONE HCL 5 MG PO TABS: 5.0000 mg | ORAL_TABLET | Freq: Once | ORAL | Status: DC | PRN

## 2015-12-07 MED ORDER — KETOROLAC TROMETHAMINE 30 MG/ML IJ SOLN
30.0000 mg | Freq: Three times a day (TID) | INTRAMUSCULAR | Status: AC
  Administered 2015-12-07 – 2015-12-08 (×4): 30 mg via INTRAVENOUS
  Filled 2015-12-07 (×4): qty 1

## 2015-12-07 MED ORDER — NALOXONE HCL 0.4 MG/ML IJ SOLN
0.4000 mg | INTRAMUSCULAR | Status: DC | PRN
  Filled 2015-12-07: qty 1

## 2015-12-07 MED ORDER — HYDROMORPHONE HCL 1 MG/ML IJ SOLN
0.2500 mg | INTRAMUSCULAR | Status: DC | PRN
  Administered 2015-12-07 (×6): 0.5 mg via INTRAVENOUS

## 2015-12-07 MED ORDER — CEFOXITIN SODIUM 2 G IV SOLR: INTRAVENOUS | Status: DC | PRN

## 2015-12-07 MED ORDER — DIPHENHYDRAMINE HCL 50 MG/ML IJ SOLN
12.5000 mg | Freq: Four times a day (QID) | INTRAMUSCULAR | Status: DC | PRN
  Administered 2015-12-07 – 2015-12-08 (×3): 12.5 mg via INTRAVENOUS
  Filled 2015-12-07 (×3): qty 1

## 2015-12-07 MED ORDER — DIPHENHYDRAMINE HCL 12.5 MG/5ML PO ELIX
12.5000 mg | ORAL_SOLUTION | Freq: Four times a day (QID) | ORAL | Status: DC | PRN
  Filled 2015-12-07: qty 5

## 2015-12-07 MED ORDER — MIDAZOLAM HCL 2 MG/2ML IJ SOLN
INTRAMUSCULAR | Status: DC | PRN
  Administered 2015-12-07: 2 mg via INTRAVENOUS

## 2015-12-07 MED ORDER — SUGAMMADEX SODIUM 200 MG/2ML IV SOLN
INTRAVENOUS | Status: DC | PRN
  Administered 2015-12-07: 148.8 mg via INTRAVENOUS

## 2015-12-07 MED ORDER — PROMETHAZINE HCL 25 MG/ML IJ SOLN
INTRAMUSCULAR | Status: AC
  Administered 2015-12-07: 25 mg via INTRAVENOUS
  Filled 2015-12-07: qty 1

## 2015-12-07 MED ORDER — MORPHINE SULFATE 2 MG/ML IV SOLN
INTRAVENOUS | Status: DC
  Administered 2015-12-07: 11:00:00 via INTRAVENOUS
  Filled 2015-12-07: qty 25

## 2015-12-07 MED ORDER — ROCURONIUM BROMIDE 100 MG/10ML IV SOLN
INTRAVENOUS | Status: DC | PRN
  Administered 2015-12-07: 5 mg via INTRAVENOUS
  Administered 2015-12-07: 40 mg via INTRAVENOUS
  Administered 2015-12-07: 10 mg via INTRAVENOUS
  Administered 2015-12-07: 5 mg via INTRAVENOUS

## 2015-12-07 MED ORDER — LACTATED RINGERS IV SOLN
INTRAVENOUS | Status: DC
  Administered 2015-12-07 – 2015-12-08 (×3): via INTRAVENOUS

## 2015-12-07 MED ORDER — LIDOCAINE HCL (CARDIAC) 20 MG/ML IV SOLN
INTRAVENOUS | Status: DC | PRN
  Administered 2015-12-07: 80 mg via INTRAVENOUS

## 2015-12-07 MED ORDER — FAMOTIDINE 20 MG PO TABS
ORAL_TABLET | ORAL | Status: AC
  Administered 2015-12-07: 20 mg via ORAL
  Filled 2015-12-07: qty 1

## 2015-12-07 MED ORDER — OXYCODONE HCL 5 MG/5ML PO SOLN: 5.0000 mg | Freq: Once | ORAL | Status: DC | PRN

## 2015-12-07 MED ORDER — SODIUM CHLORIDE 0.9% FLUSH: 9.0000 mL | INTRAVENOUS | Status: DC | PRN

## 2015-12-07 MED ORDER — KETOROLAC TROMETHAMINE 30 MG/ML IJ SOLN
INTRAMUSCULAR | Status: DC | PRN
  Administered 2015-12-07: 30 mg via INTRAVENOUS

## 2015-12-07 SURGICAL SUPPLY — 43 items
CANISTER SUCT 1200ML W/VALVE (MISCELLANEOUS) ×2 IMPLANT
CATH TRAY 16F METER LATEX (MISCELLANEOUS) ×2 IMPLANT
CATH URETL WHISTLE TIP 3FR (CATHETERS) ×4 IMPLANT
CHLORAPREP W/TINT 26ML (MISCELLANEOUS) ×2 IMPLANT
DRAPE LAP W/FLUID (DRAPES) ×2 IMPLANT
DRAPE UNDER BUTTOCK W/FLU (DRAPES) ×2 IMPLANT
DRSG TELFA 3X8 NADH (GAUZE/BANDAGES/DRESSINGS) ×2 IMPLANT
ELECT BLADE 6.5 EXT (BLADE) ×2 IMPLANT
ELECT CAUTERY BLADE 6.4 (BLADE) ×2 IMPLANT
ELECT REM PT RETURN 9FT ADLT (ELECTROSURGICAL) ×2
ELECTRODE REM PT RTRN 9FT ADLT (ELECTROSURGICAL) ×1 IMPLANT
GAUZE SPONGE 4X4 12PLY STRL (GAUZE/BANDAGES/DRESSINGS) ×2 IMPLANT
GLOVE BIO SURGEON STRL SZ7 (GLOVE) ×6 IMPLANT
GLOVE BIO SURGEON STRL SZ8 (GLOVE) ×12 IMPLANT
GLOVE BIOGEL PI IND STRL 6.5 (GLOVE) ×3 IMPLANT
GLOVE BIOGEL PI INDICATOR 6.5 (GLOVE) ×3
GOWN STRL REUS W/ TWL LRG LVL3 (GOWN DISPOSABLE) ×2 IMPLANT
GOWN STRL REUS W/ TWL XL LVL3 (GOWN DISPOSABLE) ×1 IMPLANT
GOWN STRL REUS W/TWL LRG LVL3 (GOWN DISPOSABLE) ×2
GOWN STRL REUS W/TWL XL LVL3 (GOWN DISPOSABLE) ×1
KIT RM TURNOVER CYSTO AR (KITS) ×2 IMPLANT
LABEL OR SOLS (LABEL) IMPLANT
PACK BASIN MAJOR ARMC (MISCELLANEOUS) ×2 IMPLANT
RETAINER VISCERA MED (MISCELLANEOUS) ×2 IMPLANT
SENSORWIRE 0.038 NOT ANGLED (WIRE) ×2
SOL PREP PVP 2OZ (MISCELLANEOUS) ×2
SOLUTION PREP PVP 2OZ (MISCELLANEOUS) ×1 IMPLANT
SPONGE XRAY 4X4 16PLY STRL (MISCELLANEOUS) IMPLANT
STAPLER INSORB 30 2030 C-SECTI (MISCELLANEOUS) ×2 IMPLANT
STAPLER SKIN PROX 35W (STAPLE) ×2 IMPLANT
SURGILUBE 2OZ TUBE FLIPTOP (MISCELLANEOUS) ×2 IMPLANT
SUT CHROMIC 2 0 CT 1 (SUTURE) ×2 IMPLANT
SUT PDS AB 1 TP1 96 (SUTURE) ×4 IMPLANT
SUT VIC AB 0 CT1 27 (SUTURE) ×3
SUT VIC AB 0 CT1 27XCR 8 STRN (SUTURE) ×3 IMPLANT
SUT VIC AB 0 CT1 36 (SUTURE) ×4 IMPLANT
SUT VIC AB 2-0 SH 27 (SUTURE) ×4
SUT VIC AB 2-0 SH 27XBRD (SUTURE) ×4 IMPLANT
SUT VICRYL PLUS ABS 0 54 (SUTURE) ×2 IMPLANT
SYR BULB IRRIG 60ML STRL (SYRINGE) ×2 IMPLANT
TRAY PREP VAG/GEN (MISCELLANEOUS) IMPLANT
WATER STERILE IRR 1000ML POUR (IV SOLUTION) ×2 IMPLANT
WIRE SENSOR 0.038 NOT ANGLED (WIRE) ×1 IMPLANT

## 2015-12-07 NOTE — Transfer of Care (Signed)
Immediate Anesthesia Transfer of Care Note  Patient: Carol Mooney  Procedure(s) Performed: Procedure(s): HYSTERECTOMY ABDOMINAL WITH SALPINGECTOMY/EXCISION OF ENDOMETRIOS (Bilateral) OOPHORECTOMY (Bilateral) UTERINE STENT PLACEMENT/REMOVAL (Bilateral)  Patient Location: PACU  Anesthesia Type:General  Level of Consciousness: awake, alert  and oriented  Airway & Oxygen Therapy: Patient Spontanous Breathing and Patient connected to face mask oxygen  Post-op Assessment: Report given to RN and Post -op Vital signs reviewed and stable  Post vital signs: Reviewed and stable  Last Vitals:  Vitals:   12/07/15 0618  BP: (!) 111/57  Pulse: 80  Resp: 17  Temp: 36.8 C    Last Pain:  Vitals:   12/07/15 0618  TempSrc: Oral         Complications: No apparent anesthesia complications

## 2015-12-07 NOTE — Progress Notes (Signed)
Patient ID: Carol Mooney, female   DOB: 1976-01-06, 40 y.o.   MRN: [id number] DOS s/p TAH BSO for CPP . + endometriosis. Pain now controlled with PCA . VSS good urine output .  Cont care

## 2015-12-07 NOTE — Brief Op Note (Signed)
12/07/2015  9:45 AM  PATIENT:  Carol Mooney  40 y.o. female  PRE-OPERATIVE DIAGNOSIS:  Chronic Pelvic Pain  Menometorrhagia  Endometriosis, LGSIL  POST-OPERATIVE DIAGNOSIS:  Chronic Pelvic Pain  Menometorrhagia  Endometriosis, LGSIL  PROCEDURE:  TAH/BSO Ureteral stent  placement and removal  Excision of endometriosis SURGEON:  Surgeon(s) and Role:    * Boykin Nearing, MD - Primary    * Benjaman Kindler, MD - Assisting  PHYSICIAN ASSISTANT:   ASSISTANTS: none   ANESTHESIA:   general  EBL:  Total I/O In: 1000 [I.V.:1000] Out: 33 [Blood:50]  BLOOD ADMINISTERED:none  DRAINS: Urinary Catheter (Foley)   LOCAL MEDICATIONS USED:  NONE  SPECIMEN:  Source of Specimen:  cervix , uterus , fallopian tubes  and endometriotic implant  DISPOSITION OF SPECIMEN:  PATHOLOGY  COUNTS:  YES  TOURNIQUET:  * No tourniquets in log *  DICTATION: .Other Dictation: Dictation Number verbal  PLAN OF CARE: Admit to inpatient   PATIENT DISPOSITION:  PACU - hemodynamically stable.   Delay start of Pharmacological VTE agent (>24hrs) due to surgical blood loss or risk of bleeding: not applicable

## 2015-12-07 NOTE — Op Note (Signed)
NAME:  Carol Mooney, Carol Mooney NO.:  [id number]  MEDICAL RECORD NO.:  [id number]  LOCATION:  [id number]                         FACILITY:  [facility]  PHYSICIAN:  Laverta Baltimore, MDDATE OF BIRTH:  May 20, 1975  DATE OF PROCEDURE: DATE OF DISCHARGE:                              OPERATIVE REPORT   PREOPERATIVE DIAGNOSIS: 1. Chronic pelvic pain. 2. History of endometriosis. 3. Dyspareunia. 4. Menorrhagia. 5. Cervical dysplasia.  POSTOPERATIVE DIAGNOSIS: 1. Chronic pelvic pain. 2. History of endometriosis. 3. Dyspareunia. 4. Menorrhagia. 5. Cervical dysplasia.  PROCEDURE PERFORMED: 1. Total abdominal hysterectomy. 2. Bilateral salpingo-oophorectomy. 3. Ureteral stenting placement and removal. 4. Excision of endometriotic implant.  SURGEON:  Laverta Baltimore, MD  ANESTHESIA:  General endotracheal anesthesia.  SURGEON:  Laverta Baltimore, MD  FIRST ASSISTANT:  Leafy Ro.  INDICATIONS:  A 40 year old, gravida 6, para 2 patient with long history of chronic pelvic pain, right-sided greater than left.  The patient has had a previous laparoscopy documenting endometriosis.  The patient also with heavy menstrual flow and iron deficient anemia and also has LGSIL on recent Pap smear.  DESCRIPTION OF PROCEDURE:  After adequate general endotracheal anesthesia, the patient was prepped and draped in normal sterile fashion.  Time-out was performed.  The patient did receive 2 g IV cefoxitin prior to commencement of the case.  Dr. Leafy Ro, assisting surgeon started the procedure with cystoscopy and ureteral stenting bilaterally without difficulty.  Please reference her note.  The patient was then positioned correctly and a Pfannenstiel incision was made 2 fingerbreadths above the symphysis pubis.  Sharp dissection was used to identify the fascia.  Fascia was opened in the midline and opened in a transverse fashion.  Recti muscles were dissected free.  Inferior aspect of the  fascia was grasped with Kocher clamps and pyramidalis muscles dissected free.  Entry into the peritoneal cavity was accomplished sharply.  There were several areas consistent with endometriosis noted on the left ovary.  The O'Connor-O'Sullivan retractor was placed in the abdomen.  The bowel was packed cephalad.  Two large Kelly clamps were placed on the uterine cornua and used for retraction.  The round ligaments on both sides were clamped, transected, suture ligated with 0 Vicryl suture.  Anterior leaf of the broad ligament was incised along the bladder reflection to the midline from both sides and the bladder was then gently dissected off the lower uterine segment sharply and with a sponge stick.  The infundibulopelvic ligaments on both sides were doubly clamped, transected, suture ligated with 0 Vicryl suture.  Good hemostasis was noted.  Uterine arteries were skeletonized bilaterally, clamped, and transected and suture ligated with 0 Vicryl suture. Uterosacral ligaments were bilaterally clamped on both sides, transected, and suture ligated.  Cervix and uterus were then removed intact.  Vaginal cuff angles were closed with interrupted figure-of- eight 0 Vicryl sutures.  Good hemostasis was noted.  The course of the ureters were previously defined by the ureteral stents and were well away from the excisional site.  There was noted to be a 3 x 3 mm powder burn area on the anterior sigmoid serosa, this was excised sharply, sent to Pathology for identification.  A figure-of-eight of 2-0 Vicryl  suture was used to close the serosa.  Abdomen was then copiously irrigated. Good hemostasis was noted and all laparotomy sponges were removed.  The fascia was then closed with a 0 Vicryl suture in a running nonlocking fashion, good approximation of edges.  Subcutaneous tissues were irrigated and bovied for hemostasis and given the depth of the subcutaneous tissue, approximately 3.5 cm, a running 2-0  chromic suture was used to close dead space and the skin was reapproximated with Insorb absorbable staples and sterile dressing was applied.  The ureteral stents were then removed to the end of the case and the Foley catheter was left in.  There were no complications.  ESTIMATED BLOOD LOSS:  50 mL.  INTRAOPERATIVE FLUIDS:  1000 mL.  The patient tolerated the procedure well, and was taken to recovery room in good condition.          ______________________________ Laverta Baltimore, MD     TS/MEDQ  D:  12/07/2015  T:  12/07/2015  Job:  [id number]

## 2015-12-07 NOTE — H&P (Signed)
CarolKingis a 40 y.o.femalehere for TAH / BSO and ureteral stenting . Pt her for a follow up CPP and known endometriosis . Pt pain has worsened and she has dyspareunia . Right pelvic pain >left . Menometorhagia with ferritin of 5 and embx = nl . Pap with LGSIL   Past Medical History:has a past medical history of Anemia, unspecified; Chickenpox; Depression, unspecified; and Seasonal allergies., endometriosis Past Surgical History:has a past surgical history that includes Cesarean section and Tubal ligation., Laparoscopy  Family History:family history includes Arthritis in her father; Breast cancer in her maternal aunt, maternal grandmother, paternal aunt, and paternal grandmother; Diabetes type II in her father; Heart disease in her father; No Known Problems in her mother; Ovarian cancer in her paternal grandmother. Social History:reports that she has quit smoking. She has never used smokeless tobacco. She reports that she drinks alcohol. She reports that she does not use illicit drugs. OB/GYN History:             OB History   Gravida Para Term Preterm AB TAB SAB Ectopic Multiple Living   6 2 2  4  4   2       Allergies:has No Known Allergies. Medications:  Current Outpatient Prescriptions:  .acetaminophen (TYLENOL) 500 MG tablet, Take 1,000 mg by mouth every 6 (six) hours as needed for Pain., Disp: , Rfl:  .ferrous sulfate 325 (65 FE) MG EC tablet, Take 1 tablet (325 mg total) by mouth 2 (two) times daily with meals., Disp: 60 tablet, Rfl: 6 .polyethylene glycol (MIRALAX) powder, Take 17 g by mouth once daily. Mix in 4-8ounces of fluid prior to taking., Disp: 255 g, Rfl: 11 .traZODone (DESYREL) 50 MG tablet, Take 50 mg by mouth nightly as needed. , Disp: , Rfl:   Review of Systems: General: No fatigue or weightloss Eyes:No vision  changes Ears:No hearing difficulty Respiratory:No cough or shortness of breath Pulmonary: No asthma or shortness of breath Cardiovascular:No chest pain, palpitations, dyspnea on exertion Gastrointestinal:No abdominal bloating, chronic diarrhea, constipations, masses, pain or hematochezia Genitourinary:No hematuria, dysuria, abnormal vaginal discharge,++pelvic pain, + endometriosis, +Menometrorrhagia Lymphatic:No swollen lymph nodes Musculoskeletal:No muscle weakness Neurologic:No extremity weakness, syncope, seizure disorder Psychiatric:No history of depression, delusions or suicidal/homicidal ideation   Exam:      Vitals:   12/04/15  BP: 110/82  Pulse: 80    Body mass index is 31.53 kg/(m^2).  WDWN white/ female in NAD  Lungs: CTA  CV: RRR without murmur  Abdomen: soft , no mass, normal active bowel sounds, non-tender, no rebound tenderness Pelvic: tanner stage 5 ,  External genitalia: vulva /labia no lesions Urethra: no prolapse Vagina: normal physiologic d/c Cervix: no lesions, no cervical motion tenderness  Uterus: normal size shape and contour, non-tender Adnexa:no mass, non-tender  Rectovaginal: no mass  SIS test today : small 2 cm anterior fibroid . Simple cyst 1.3 cm on left . No evidence of endometrial pathology with saline injection   Impression:   The primary encounter diagnosis was Chronic pelvic pain in female. Diagnoses of Dyspareunia in female, Menorrhagia with irregular cycle, and LGSIL on Pap smear of cervix were also pertinent to this visit.    Plan:   I have spoken with the patient regarding treatment options  including expectant management, hormonal options, or surgical intervention. After a full discussion the pt elects to proceed with TAH/ BSO and ureteral stenting given the history of endometriosis Pt is aware of the potential risk of organ injury given her h/o endometriosis. Surgical menopause discussed  with the pt        Admission (Current) on 12/07/2015

## 2015-12-07 NOTE — Anesthesia Postprocedure Evaluation (Signed)
Anesthesia Post Note  Patient: Carol Mooney  Procedure(s) Performed: Procedure(s) (LRB): HYSTERECTOMY ABDOMINAL WITH SALPINGECTOMY/EXCISION OF ENDOMETRIOS (Bilateral) OOPHORECTOMY (Bilateral) UTERINE STENT PLACEMENT/REMOVAL (Bilateral)  Patient location during evaluation: PACU Anesthesia Type: General Level of consciousness: awake and alert Pain management: pain level controlled Vital Signs Assessment: post-procedure vital signs reviewed and stable Respiratory status: spontaneous breathing, nonlabored ventilation, respiratory function stable and patient connected to nasal cannula oxygen Cardiovascular status: blood pressure returned to baseline and stable Postop Assessment: no signs of nausea or vomiting Anesthetic complications: no    Last Vitals:  Vitals:   12/07/15 1120 12/07/15 1124  BP:  129/72  Pulse:  67  Resp: 18   Temp:  36.9 C    Last Pain:  Vitals:   12/07/15 1124  TempSrc: Oral  PainSc:                  Precious Haws Bode Pieper

## 2015-12-07 NOTE — Anesthesia Procedure Notes (Signed)
Procedure Name: Intubation Performed by: Darnise Montag Pre-anesthesia Checklist: Patient identified, Patient being monitored, Timeout performed, Emergency Drugs available and Suction available Patient Re-evaluated:Patient Re-evaluated prior to inductionOxygen Delivery Method: Circle system utilized Preoxygenation: Pre-oxygenation with 100% oxygen Intubation Type: IV induction Ventilation: Mask ventilation without difficulty Laryngoscope Size: Mac and 3 Grade View: Grade I Tube type: Oral Tube size: 7.0 mm Number of attempts: 1 Airway Equipment and Method: Stylet Placement Confirmation: ETT inserted through vocal cords under direct vision,  positive ETCO2 and breath sounds checked- equal and bilateral Secured at: 21 cm Tube secured with: Tape Dental Injury: Teeth and Oropharynx as per pre-operative assessment        

## 2015-12-07 NOTE — Progress Notes (Signed)
Pt ready for TAH/ BSO  And ureteral stenting . NPO . Labs nl . Neg HCG  Ready to proceed

## 2015-12-07 NOTE — Anesthesia Preprocedure Evaluation (Signed)
Anesthesia Evaluation  Patient identified by MRN, date of birth, ID band Patient awake    Reviewed: Allergy & Precautions, H&P , NPO status , Patient's Chart, lab work & pertinent test results  History of Anesthesia Complications (+) PONV and history of anesthetic complications  Airway Mallampati: II  TM Distance: >3 FB Neck ROM: full    Dental no notable dental hx. (+) Poor Dentition, Chipped   Pulmonary neg shortness of breath, former smoker,    Pulmonary exam normal breath sounds clear to auscultation       Cardiovascular Exercise Tolerance: Good (-) angina(-) Past MI and (-) DOE negative cardio ROS Normal cardiovascular exam Rhythm:regular Rate:Normal     Neuro/Psych PSYCHIATRIC DISORDERS Depression negative neurological ROS     GI/Hepatic negative GI ROS, Neg liver ROS,   Endo/Other  negative endocrine ROS  Renal/GU negative Renal ROS  negative genitourinary   Musculoskeletal   Abdominal   Peds  Hematology negative hematology ROS (+)   Anesthesia Other Findings Past Medical History: No date: Allergy No date: Complication of anesthesia     Comment: nausea and vomiting No date: Depression No date: PONV (postoperative nausea and vomiting)  Past Surgical History: No date: c sections 2000: TUBAL LIGATION  BMI    Body Mass Index:  28.15 kg/m      Reproductive/Obstetrics negative OB ROS                             Anesthesia Physical Anesthesia Plan  ASA: II  Anesthesia Plan: General ETT   Post-op Pain Management:    Induction:   Airway Management Planned:   Additional Equipment:   Intra-op Plan:   Post-operative Plan:   Informed Consent: I have reviewed the patients History and Physical, chart, labs and discussed the procedure including the risks, benefits and alternatives for the proposed anesthesia with the patient or authorized representative who has indicated  his/her understanding and acceptance.   Dental Advisory Given  Plan Discussed with: Anesthesiologist, CRNA and Surgeon  Anesthesia Plan Comments:         Anesthesia Quick Evaluation

## 2015-12-08 ENCOUNTER — Encounter: Payer: Self-pay | Admitting: Obstetrics and Gynecology

## 2015-12-08 LAB — CBC
HCT: 35.4 % (ref 35.0–47.0)
Hemoglobin: 12.5 g/dL (ref 12.0–16.0)
MCH: 33.2 pg (ref 26.0–34.0)
MCHC: 35.2 g/dL (ref 32.0–36.0)
MCV: 94.2 fL (ref 80.0–100.0)
Platelets: 258 10*3/uL (ref 150–440)
RBC: 3.76 MIL/uL — ABNORMAL LOW (ref 3.80–5.20)
RDW: 14.4 % (ref 11.5–14.5)
WBC: 9.6 10*3/uL (ref 3.6–11.0)

## 2015-12-08 LAB — BASIC METABOLIC PANEL
Anion gap: 6 (ref 5–15)
BUN: 6 mg/dL (ref 6–20)
CO2: 28 mmol/L (ref 22–32)
Calcium: 8.7 mg/dL — ABNORMAL LOW (ref 8.9–10.3)
Chloride: 106 mmol/L (ref 101–111)
Creatinine, Ser: 0.54 mg/dL (ref 0.44–1.00)
GFR calc Af Amer: >60 mL/min (ref >60)
GFR calc non Af Amer: >60 mL/min (ref >60)
Glucose, Bld: 84 mg/dL (ref 65–99)
Potassium: 3.2 mmol/L — ABNORMAL LOW (ref 3.5–5.1)
Sodium: 140 mmol/L (ref 135–145)

## 2015-12-08 MED ORDER — DIPHENHYDRAMINE HCL 50 MG/ML IJ SOLN
12.5000 mg | Freq: Four times a day (QID) | INTRAMUSCULAR | Status: DC | PRN
  Administered 2015-12-08: 12.5 mg via INTRAVENOUS
  Filled 2015-12-08: qty 1

## 2015-12-08 MED ORDER — OXYCODONE-ACETAMINOPHEN 5-325 MG PO TABS
1.0000 | ORAL_TABLET | ORAL | Status: DC | PRN
  Administered 2015-12-08 – 2015-12-10 (×11): 2 via ORAL
  Filled 2015-12-08 (×11): qty 2

## 2015-12-08 MED ORDER — OXYCODONE-ACETAMINOPHEN 5-325 MG PO TABS
1.0000 | ORAL_TABLET | ORAL | Status: DC | PRN
  Administered 2015-12-08: 2 via ORAL
  Filled 2015-12-08: qty 2

## 2015-12-08 MED ORDER — BUPROPION HCL ER (SR) 150 MG PO TB12
150.0000 mg | ORAL_TABLET | Freq: Two times a day (BID) | ORAL | Status: DC
  Administered 2015-12-08 – 2015-12-10 (×5): 150 mg via ORAL
  Filled 2015-12-08 (×6): qty 1

## 2015-12-08 MED ORDER — TRAZODONE HCL 50 MG PO TABS
50.0000 mg | ORAL_TABLET | Freq: Every day | ORAL | Status: DC
  Administered 2015-12-08 – 2015-12-09 (×2): 50 mg via ORAL
  Filled 2015-12-08 (×3): qty 1

## 2015-12-08 MED ORDER — IBUPROFEN 800 MG PO TABS
800.0000 mg | ORAL_TABLET | Freq: Three times a day (TID) | ORAL | Status: DC | PRN
  Administered 2015-12-09 – 2015-12-10 (×4): 800 mg via ORAL
  Filled 2015-12-08 (×4): qty 1

## 2015-12-08 MED ORDER — NALBUPHINE HCL 10 MG/ML IJ SOLN
5.0000 mg | INTRAMUSCULAR | Status: DC | PRN
  Administered 2015-12-08 – 2015-12-09 (×6): 5 mg via INTRAVENOUS
  Filled 2015-12-08 (×6): qty 1

## 2015-12-08 MED ORDER — SENNA 8.6 MG PO TABS
1.0000 | ORAL_TABLET | Freq: Every day | ORAL | Status: DC
  Administered 2015-12-08 – 2015-12-10 (×3): 8.6 mg via ORAL
  Filled 2015-12-08 (×3): qty 1

## 2015-12-08 MED ORDER — NALBUPHINE HCL 10 MG/ML IJ SOLN
5.0000 mg | Freq: Once | INTRAMUSCULAR | Status: AC
  Administered 2015-12-08: 5 mg via INTRAVENOUS
  Filled 2015-12-08: qty 1

## 2015-12-08 MED ORDER — DIPHENHYDRAMINE HCL 25 MG PO CAPS
50.0000 mg | ORAL_CAPSULE | Freq: Four times a day (QID) | ORAL | Status: DC | PRN
  Administered 2015-12-09 – 2015-12-10 (×3): 50 mg via ORAL
  Filled 2015-12-08 (×3): qty 2

## 2015-12-08 MED ORDER — FERROUS SULFATE 325 (65 FE) MG PO TABS
325.0000 mg | ORAL_TABLET | Freq: Every day | ORAL | Status: DC
  Administered 2015-12-08 – 2015-12-10 (×3): 325 mg via ORAL
  Filled 2015-12-08 (×3): qty 1

## 2015-12-08 MED ORDER — DOCUSATE SODIUM 100 MG PO CAPS
100.0000 mg | ORAL_CAPSULE | Freq: Two times a day (BID) | ORAL | Status: DC
  Administered 2015-12-08 – 2015-12-10 (×4): 100 mg via ORAL
  Filled 2015-12-08 (×4): qty 1

## 2015-12-08 MED ORDER — POLYETHYLENE GLYCOL 3350 17 G PO PACK
17.0000 g | PACK | Freq: Every day | ORAL | Status: DC
  Administered 2015-12-08 – 2015-12-10 (×3): 17 g via ORAL
  Filled 2015-12-08 (×3): qty 1

## 2015-12-08 MED ORDER — DIPHENHYDRAMINE HCL 12.5 MG/5ML PO ELIX
12.5000 mg | ORAL_SOLUTION | Freq: Four times a day (QID) | ORAL | Status: DC | PRN
  Filled 2015-12-08: qty 5

## 2015-12-08 MED ORDER — DIPHENHYDRAMINE HCL 50 MG/ML IJ SOLN
25.0000 mg | Freq: Once | INTRAMUSCULAR | Status: AC
Start: 2015-12-08 — End: 2015-12-08
  Administered 2015-12-08: 25 mg via INTRAVENOUS
  Filled 2015-12-08: qty 1

## 2015-12-08 NOTE — Progress Notes (Signed)
1 Day Post-Op Procedure(s) (LRB): HYSTERECTOMY ABDOMINAL WITH SALPINGECTOMY/EXCISION OF ENDOMETRIOS (Bilateral) OOPHORECTOMY (Bilateral) UTERINE STENT PLACEMENT/REMOVAL (Bilateral)  Subjective: Patient reports itching , pain 6+/10 Objective: I have reviewed patient's vital signs, intake and output and medications.  Resp: clear to auscultation bilaterally and normal percussion bilaterally Cardio: regular rate and rhythm, S1, S2 normal, no murmur, click, rub or gallop GI: soft, non-tender; bowel sounds normal; no masses,  no organomegaly Incision covered and dry Assessment: s/p Procedure(s): HYSTERECTOMY ABDOMINAL WITH SALPINGECTOMY/EXCISION OF ENDOMETRIOS (Bilateral) OOPHORECTOMY (Bilateral) UTERINE STENT PLACEMENT/REMOVAL (Bilateral): stable and itching probably related to PCA   Plan: Advance diet Encourage ambulation Advance to PO medication Discontinue IV fluids  LOS: 1 day    SCHERMERHORN,THOMAS 12/08/2015, 9:32 AM

## 2015-12-09 LAB — SURGICAL PATHOLOGY

## 2015-12-09 NOTE — Progress Notes (Signed)
2 Days Post-Op Procedure(s) (LRB): HYSTERECTOMY ABDOMINAL WITH SALPINGECTOMY/EXCISION OF ENDOMETRIOS (Bilateral) OOPHORECTOMY (Bilateral) UTERINE STENT PLACEMENT/REMOVAL (Bilateral)  Subjective: Patient reports itching which has improved with Nubain . Now on percoet and motrin. + Flatus   Objective: I have reviewed patient's vital signs.  Resp: clear to auscultation bilaterally and normal percussion bilaterally Cardio: regular rate and rhythm, S1, S2 normal, no murmur, click, rub or gallop GI: soft, non-tender; bowel sounds normal; no masses,  no organomegaly Incision C/D/I Assessment: s/p Procedure(s): HYSTERECTOMY ABDOMINAL WITH SALPINGECTOMY/EXCISION OF ENDOMETRIOS (Bilateral) OOPHORECTOMY (Bilateral) UTERINE STENT PLACEMENT/REMOVAL (Bilateral): stable  Plan: Advance diet  LOS: 2 days  Anticipate d/c in am   Carol Mooney 12/09/2015, 10:11 AM

## 2015-12-10 MED ORDER — ONDANSETRON HCL 8 MG PO TABS: 8.0000 mg | ORAL_TABLET | Freq: Three times a day (TID) | ORAL | 0 refills | Status: DC | PRN

## 2015-12-10 MED ORDER — DOCUSATE SODIUM 100 MG PO CAPS: 100.0000 mg | ORAL_CAPSULE | Freq: Two times a day (BID) | ORAL | 0 refills | Status: DC

## 2015-12-10 MED ORDER — IBUPROFEN 800 MG PO TABS: 800.0000 mg | ORAL_TABLET | Freq: Three times a day (TID) | ORAL | 0 refills | Status: DC | PRN

## 2015-12-10 MED ORDER — OXYCODONE-ACETAMINOPHEN 5-325 MG PO TABS: 1.0000 | ORAL_TABLET | ORAL | 0 refills | Status: DC | PRN

## 2015-12-10 NOTE — Discharge Summary (Signed)
Physician Discharge Summary  Patient ID: Carol Mooney MRN: [id number] DOB/AGE: [birth year] 41 y.o.  Admit date: 12/07/2015 Discharge date: 12/10/2015  Admission Diagnoses:  Discharge Diagnoses:  Active Problems:   Post-operative state   Discharged Condition: good  Hospital Course: pt underwent an uncomplicated TAH / BSO for pelvic pain and endometriosis. Ureteral stenting just for the procedure   Consults: None  Significant Diagnostic Studies: labs: post po HCT 35% , Bun / cr 6/0.5  Treatments: surgery: as above   Discharge Exam: Blood pressure 110/63, pulse 86, temperature 98 F (36.7 C), temperature source Oral, resp. rate 20, height 5\' 4"  (1.626 m), weight 164 lb (74.4 kg), SpO2 100 %. General appearance: alert and cooperative Resp: clear to auscultation bilaterally Cardio: regular rate and rhythm, S1, S2 normal, no murmur, click, rub or gallop GI: soft, non-tender; bowel sounds normal; no masses,  no organomegaly  Incision C/D/I  Disposition: 01-Home or Self Care  Discharge Instructions    Call MD for:  difficulty breathing, headache or visual disturbances    Complete by:  As directed   Call MD for:  extreme fatigue    Complete by:  As directed   Call MD for:  hives    Complete by:  As directed   Call MD for:  persistant dizziness or light-headedness    Complete by:  As directed   Call MD for:  persistant nausea and vomiting    Complete by:  As directed   Call MD for:  redness, tenderness, or signs of infection (pain, swelling, redness, odor or green/yellow discharge around incision site)    Complete by:  As directed   Call MD for:  severe uncontrolled pain    Complete by:  As directed   Call MD for:  temperature >100.4    Complete by:  As directed   Diet - low sodium heart healthy    Complete by:  As directed   Increase activity slowly    Complete by:  As directed       Medication List    STOP taking these medications   ferrous sulfate 325 (65 FE) MG tablet    PYRIDIUM PO     TAKE these medications   acetaminophen 325 MG tablet Commonly known as:  TYLENOL Take 650 mg by mouth every 6 (six) hours as needed.   ascorbic acid 500 MG tablet Commonly known as:  VITAMIN C Take 500 mg by mouth daily.   buPROPion 150 MG 12 hr tablet Commonly known as:  WELLBUTRIN SR Take 1 tablet (150 mg total) by mouth 2 (two) times daily.   Calcium + D3 600-200 MG-UNIT Tabs Take 1 tablet by mouth daily.   docusate sodium 100 MG capsule Commonly known as:  COLACE Take 1 capsule (100 mg total) by mouth 2 (two) times daily.   ibuprofen 800 MG tablet Commonly known as:  ADVIL,MOTRIN Take 1 tablet (800 mg total) by mouth 3 (three) times daily as needed for moderate pain.   ondansetron 8 MG tablet Commonly known as:  ZOFRAN Take 1 tablet (8 mg total) by mouth every 8 (eight) hours as needed for nausea or vomiting.   oxyCODONE-acetaminophen 5-325 MG tablet Commonly known as:  PERCOCET/ROXICET Take 1-2 tablets by mouth every 4 (four) hours as needed for moderate pain.   polyethylene glycol packet Commonly known as:  MIRALAX / GLYCOLAX Take 17 g by mouth daily.   senna 8.6 MG Tabs tablet Commonly known as:  SENOKOT Take 1 tablet  by mouth daily as needed for mild constipation.   traZODone 50 MG tablet Commonly known as:  DESYREL Take 1 tablet (50 mg total) by mouth at bedtime as needed for sleep (as needed for sleep).   valACYclovir 1000 MG tablet Commonly known as:  VALTREX Two pills every 12 hours for one day at onset of cold sore      Follow-up Information    Hamish Banks, MD Follow up in 10 day(s).   Specialty:  Obstetrics and Gynecology Why:  post op check , discuss treatment for endometriosis Contact information: [address] [place] [address] [phone]           Signed: Laverta Baltimore 12/10/2015, 7:27 AM

## 2015-12-10 NOTE — Progress Notes (Signed)
Patient discharged home with spouse. Discharge instructions, prescriptions and follow up appointment given to and reviewed with patient and spouse. Patient verbalized understanding. Escorted out via wheelchair by [employer].

## 2015-12-17 DIAGNOSIS — N803 Endometriosis of pelvic peritoneum: Secondary | ICD-10-CM | POA: Diagnosis not present

## 2015-12-17 DIAGNOSIS — N898 Other specified noninflammatory disorders of vagina: Secondary | ICD-10-CM | POA: Diagnosis not present

## 2015-12-17 DIAGNOSIS — A5901 Trichomonal vulvovaginitis: Secondary | ICD-10-CM | POA: Diagnosis not present

## 2016-01-02 ENCOUNTER — Emergency Department: Payer: 59

## 2016-01-02 ENCOUNTER — Emergency Department
Admission: EM | Admit: 2016-01-02 | Discharge: 2016-01-03 | Disposition: A | Discharge status: Home / Self Care | Payer: 59 | Attending: Emergency Medicine | Admitting: Emergency Medicine

## 2016-01-02 ENCOUNTER — Encounter: Payer: Self-pay | Admitting: Emergency Medicine

## 2016-01-02 DIAGNOSIS — K5901 Slow transit constipation: Secondary | ICD-10-CM | POA: Diagnosis not present

## 2016-01-02 DIAGNOSIS — Z87891 Personal history of nicotine dependence: Secondary | ICD-10-CM | POA: Insufficient documentation

## 2016-01-02 DIAGNOSIS — R1032 Left lower quadrant pain: Secondary | ICD-10-CM | POA: Diagnosis not present

## 2016-01-02 LAB — URINALYSIS COMPLETE WITH MICROSCOPIC (ARMC ONLY)
Bilirubin Urine: NEGATIVE
Glucose, UA: NEGATIVE mg/dL
Hgb urine dipstick: NEGATIVE
Ketones, ur: NEGATIVE mg/dL
Leukocytes, UA: NEGATIVE
Nitrite: NEGATIVE
Protein, ur: NEGATIVE mg/dL
Specific Gravity, Urine: 1.02 (ref 1.005–1.030)
pH: 5 (ref 5.0–8.0)

## 2016-01-02 LAB — COMPREHENSIVE METABOLIC PANEL
ALT: 17 U/L (ref 14–54)
AST: 16 U/L (ref 15–41)
Albumin: 4.2 g/dL (ref 3.5–5.0)
Alkaline Phosphatase: 39 U/L (ref 38–126)
Anion gap: 6 (ref 5–15)
BUN: 10 mg/dL (ref 6–20)
CO2: 26 mmol/L (ref 22–32)
Calcium: 8.8 mg/dL — ABNORMAL LOW (ref 8.9–10.3)
Chloride: 107 mmol/L (ref 101–111)
Creatinine, Ser: 0.81 mg/dL (ref 0.44–1.00)
GFR calc Af Amer: >60 mL/min (ref >60)
GFR calc non Af Amer: >60 mL/min (ref >60)
Glucose, Bld: 98 mg/dL (ref 65–99)
Potassium: 3.9 mmol/L (ref 3.5–5.1)
Sodium: 139 mmol/L (ref 135–145)
Total Bilirubin: 0.6 mg/dL (ref 0.3–1.2)
Total Protein: 6.9 g/dL (ref 6.5–8.1)

## 2016-01-02 LAB — CBC
HCT: 37.2 % (ref 35.0–47.0)
Hemoglobin: 12.8 g/dL (ref 12.0–16.0)
MCH: 32.4 pg (ref 26.0–34.0)
MCHC: 34.3 g/dL (ref 32.0–36.0)
MCV: 94.4 fL (ref 80.0–100.0)
Platelets: 349 10*3/uL (ref 150–440)
RBC: 3.94 MIL/uL (ref 3.80–5.20)
RDW: 13.3 % (ref 11.5–14.5)
WBC: 6.7 10*3/uL (ref 3.6–11.0)

## 2016-01-02 LAB — LIPASE, BLOOD: Lipase: 26 U/L (ref 11–51)

## 2016-01-02 MED ORDER — KETOROLAC TROMETHAMINE 30 MG/ML IJ SOLN
30.0000 mg | Freq: Once | INTRAMUSCULAR | Status: AC
  Administered 2016-01-03: 30 mg via INTRAVENOUS
  Filled 2016-01-02: qty 1

## 2016-01-02 MED ORDER — FENTANYL CITRATE (PF) 100 MCG/2ML IJ SOLN
50.0000 ug | INTRAMUSCULAR | Status: DC | PRN
  Administered 2016-01-02: 50 ug via NASAL
  Filled 2016-01-02: qty 2

## 2016-01-02 NOTE — ED Triage Notes (Signed)
Pt ambulatory to triage with no difficulty. Pt reports she had an adb hysterectomy on 12/07/15 . Pt reports pain to her left lower abd behind her incision line since the surgery. Pt reports over the last few days her abd pain has been getting worse. Pt reports also having constipation. Reports last BM was 4 days ago. States she has been having to use all types of laxatives and stool softeners to have a BM. Pt denies fever or signs of infection at the incision site.

## 2016-01-03 ENCOUNTER — Encounter: Payer: Self-pay | Admitting: Radiology

## 2016-01-03 ENCOUNTER — Emergency Department: Payer: 59

## 2016-01-03 DIAGNOSIS — R1032 Left lower quadrant pain: Secondary | ICD-10-CM | POA: Diagnosis not present

## 2016-01-03 DIAGNOSIS — Z87891 Personal history of nicotine dependence: Secondary | ICD-10-CM | POA: Diagnosis not present

## 2016-01-03 DIAGNOSIS — K5901 Slow transit constipation: Secondary | ICD-10-CM | POA: Diagnosis not present

## 2016-01-03 MED ORDER — DICYCLOMINE HCL 20 MG PO TABS: 20.0000 mg | ORAL_TABLET | Freq: Three times a day (TID) | ORAL | 0 refills | Status: DC | PRN

## 2016-01-03 MED ORDER — IOPAMIDOL (ISOVUE-300) INJECTION 61%
100.0000 mL | Freq: Once | INTRAVENOUS | Status: AC | PRN
  Administered 2016-01-03: 100 mL via INTRAVENOUS

## 2016-01-03 MED ORDER — IOPAMIDOL (ISOVUE-300) INJECTION 61%
15.0000 mL | INTRAVENOUS | Status: AC
Start: 2016-01-03 — End: 2016-01-03
  Administered 2016-01-03: 15 mL via ORAL

## 2016-01-03 MED ORDER — IOPAMIDOL (ISOVUE-300) INJECTION 61%: 30.0000 mL | Freq: Once | INTRAVENOUS | Status: DC | PRN

## 2016-01-03 NOTE — ED Notes (Signed)
Pt had large BM.  

## 2016-01-03 NOTE — ED Provider Notes (Signed)
Time Seen: Approximately1110  I have reviewed the triage notes  Chief Complaint: Abdominal Pain; Constipation; and Post-op Problem   History of Present Illness: Carol Mooney is a 40 y.o. female who states that she's having some left lower quadrant abdominal pain and is status post abdominal hysterectomy on 12/07/15. She denies any fever. She states she's had it over the last several days and has had some difficulty with her bowels. She states she has a long history of issues with her bowel movements and her last reported bowel movement was 4 days ago. Over-the-counter stool softeners and laxatives without success. She denies any nausea or vomiting.   Past Medical History:  Diagnosis Date  . Allergy   . Complication of anesthesia    nausea and vomiting  . Depression   . PONV (postoperative nausea and vomiting)     Patient Active Problem List   Diagnosis Date Noted  . Post-operative state 12/07/2015  . Endometriosis 09/01/2015  . Clinical depression 10/13/2014    Past Surgical History:  Procedure Laterality Date  . c sections    . HYSTERECTOMY ABDOMINAL WITH SALPINGECTOMY Bilateral 12/07/2015   Procedure: HYSTERECTOMY ABDOMINAL WITH SALPINGECTOMY/EXCISION OF ENDOMETRIOS;  Surgeon: Boykin Nearing, MD;  Location: [facility];  Service: Gynecology;  Laterality: Bilateral;  . OOPHORECTOMY Bilateral 12/07/2015   Procedure: OOPHORECTOMY;  Surgeon: Boykin Nearing, MD;  Location: [facility];  Service: Gynecology;  Laterality: Bilateral;  . TUBAL LIGATION  2000  . UTERINE STENT PLACEMENT Bilateral 12/07/2015   Procedure: UTERINE STENT PLACEMENT/REMOVAL;  Surgeon: Boykin Nearing, MD;  Location: [facility];  Service: Gynecology;  Laterality: Bilateral;    Past Surgical History:  Procedure Laterality Date  . c sections    . HYSTERECTOMY ABDOMINAL WITH SALPINGECTOMY Bilateral 12/07/2015   Procedure: HYSTERECTOMY ABDOMINAL WITH SALPINGECTOMY/EXCISION OF ENDOMETRIOS;  Surgeon:  Boykin Nearing, MD;  Location: [facility];  Service: Gynecology;  Laterality: Bilateral;  . OOPHORECTOMY Bilateral 12/07/2015   Procedure: OOPHORECTOMY;  Surgeon: Boykin Nearing, MD;  Location: [facility];  Service: Gynecology;  Laterality: Bilateral;  . TUBAL LIGATION  2000  . UTERINE STENT PLACEMENT Bilateral 12/07/2015   Procedure: UTERINE STENT PLACEMENT/REMOVAL;  Surgeon: Boykin Nearing, MD;  Location: [facility];  Service: Gynecology;  Laterality: Bilateral;    Current Outpatient Rx  . Order #: [id number] Class: Historical Med  . Order #: [id number] Class: Historical Med  . Order #: [id number] Class: Normal  . Order #: [id number] Class: Historical Med  . Order #: [id number] Class: Print  . Order #: [id number] Class: Normal  . Order #: [id number] Class: Normal  . Order #: [id number] Class: Normal  . Order #: [id number] Class: Print  . Order #: [id number] Class: Historical Med  . Order #: [id number] Class: Historical Med  . Order #: [id number] Class: Normal  . Order #: [id number] Class: Normal    Allergies:  Morphine and related  Family History: Family History  Problem Relation Age of Onset  . Coronary artery disease Father   . Depression Father   . Diabetes Father     insulin dependent  . Depression Brother   . Cancer Maternal Grandmother     breast  . Breast cancer Maternal Grandmother 45  . Depression Sister   . Breast cancer Paternal Grandmother 2    Social History: Social History  Substance Use Topics  . Smoking status: Former Smoker    Quit date: 05/19/2013  . Smokeless tobacco: Never Used  . Alcohol use 1.2 oz/week    2 Shots of liquor per week  Comment: very rarely     Review of Systems:   10 point review of systems was performed and was otherwise negative:  Constitutional: No fever Eyes: No visual disturbances ENT: No sore throat, ear pain Cardiac: No chest pain Respiratory: No shortness of breath, wheezing, or stridor Abdomen: Mainly over the left  lower quadrant of the abdomen without flank pain. No right-sided abdominal pain Endocrine: No weight loss, No night sweats Extremities: No peripheral edema, cyanosis Skin: No rashes, easy bruising Neurologic: No focal weakness, trouble with speech or swollowing Urologic: No dysuria, Hematuria, or urinary frequency   Physical Exam:  ED Triage Vitals [01/02/16 1959]  Enc Vitals Group     BP 113/72     Pulse Rate 85     Resp 16     Temp 98.7 F (37.1 C)     Temp Source Oral     SpO2 99 %     Weight 160 lb (72.6 kg)     Height 5\' 6"  (1.676 m)     Head Circumference      Peak Flow      Pain Score 10     Pain Loc      Pain Edu?      Excl. in [place]?     General: Awake , Alert , and Oriented times 3; GCS 15 Head: Normal cephalic , atraumatic Eyes: Pupils equal , round, reactive to light Nose/Throat: No nasal drainage, patent upper airway without erythema or exudate.  Neck: Supple, Full range of motion, No anterior adenopathy or palpable thyroid masses Lungs: Clear to ascultation without wheezes , rhonchi, or rales Heart: Regular rate, regular rhythm without murmurs , gallops , or rubs Abdomen: Patient has mild tenderness in the left lower quadrant without rebound guarding or rigidity. Well healing well aligned surgical wound with bowel sounds positive and symmetric in all 4 quadrants.    Extremities: 2 plus symmetric pulses. No edema, clubbing or cyanosis Neurologic: normal ambulation, Motor symmetric without deficits, sensory intact Skin: warm, dry, no rashes   Labs:   All laboratory work was reviewed including any pertinent negatives or positives listed below:  Labs Reviewed  COMPREHENSIVE METABOLIC PANEL - Abnormal; Notable for the following:       Result Value   Calcium 8.8 (*)    All other components within normal limits  URINALYSIS COMPLETEWITH MICROSCOPIC ([facility] ONLY) - Abnormal; Notable for the following:    Color, Urine YELLOW (*)    APPearance CLEAR (*)     Bacteria, UA RARE (*)    Squamous Epithelial / LPF 0-5 (*)    All other components within normal limits  LIPASE, BLOOD  CBC    Radiology:  "Ct Abdomen Pelvis W Contrast  Result Date: 01/03/2016 CLINICAL DATA:  Left lower quadrant abdominal pain. Hysterectomy 1 month prior 12/07/2015. Pain about the incision line since surgery, progressive over the last few days. EXAM: CT ABDOMEN AND PELVIS WITH CONTRAST TECHNIQUE: Multidetector CT imaging of the abdomen and pelvis was performed using the standard protocol following bolus administration of intravenous contrast. CONTRAST:  157mL ISOVUE-300 IOPAMIDOL (ISOVUE-300) INJECTION 61% COMPARISON:  Radiographs yesterday. FINDINGS: Lower chest: Lung bases are clear. Hepatobiliary: Tiny scattered subcentimeter hepatic hypodensities, too small to characterize. Gallbladder minimally distended, no calcified stone. No biliary dilatation. Pancreas: No ductal dilatation or inflammation. Spleen: Normal in size without focal abnormality. Adrenals/Urinary Tract: Normal adrenal glands. Punctate nonobstructing stone in the mid left kidney. No hydronephrosis or perinephric edema. Homogeneous enhancement. Urinary bladder is  physiologically distended and unremarkable. Stomach/Bowel: Stomach is within normal limits. Appendix appears normal. No evidence of bowel wall thickening, distention, or inflammatory changes. Moderate stool burden. Vascular/Lymphatic: No significant vascular findings are present. No enlarged abdominal or pelvic lymph nodes. Reproductive: Status post hysterectomy. No adnexal masses. No pelvic fluid collection. No abnormality of the vaginal cuff. Other: Expected postsurgical change in the anterior abdominal wall. No subcutaneous fluid collection. No soft tissue air. No free air, free fluid, or intra-abdominal fluid collection. Musculoskeletal: There are no acute or suspicious osseous abnormalities. IMPRESSION: Post hysterectomy without complication. Expected  postsurgical change in the anterior abdominal wall. No acute intra-abdominal/pelvic abnormality. Electronically Signed   By: Jeb Levering M.D.   On: 01/03/2016 02:45   Dg Abd Acute W/chest  Result Date: 01/02/2016 CLINICAL DATA:  Abdominal pain. Left lower quadrant pain. Abdominal hysterectomy 1 month prior 12/07/2015. EXAM: DG ABDOMEN ACUTE W/ 1V CHEST COMPARISON:  None. FINDINGS: The cardiomediastinal contours are normal. The lungs are clear. There is no free intra-abdominal air. No dilated bowel loops to suggest obstruction. Moderate volume of stool throughout the colon. No radiopaque calculi. No acute osseous abnormalities are seen. IMPRESSION: Normal bowel gas pattern with moderate stool burden.  No free air. Clear lungs. Electronically Signed   By: Jeb Levering M.D.   On: 01/02/2016 23:43  "  I personally reviewed the radiologic studies    ED Course:  Patient does not appear to have any significant postoperative findings from her previous surgery. There's no bowel obstruction, abscess, ileus or etc. Felt the patient is most likely you're having postoperative discomfort or was simply constipated. Patient received an enema here in emergency department some symptomatic improvement. She was advised that she probably can come off the Ultram for postoperative pain which may be causing some of the bowel issues that she is having and to try over-the-counter pain medication such as ibuprofen and Tylenol. Clinical Course     Assessment: * Postoperative pain Constipation   Final Clinical Impression:  Final diagnoses:  Slow transit constipation     Plan: Outpatient " Discharge Medication List as of 01/03/2016  3:47 AM    START taking these medications   Details  dicyclomine (BENTYL) 20 MG tablet Take 1 tablet (20 mg total) by mouth 3 (three) times daily as needed for spasms., Starting Sun 01/03/2016, Print      " Patient was advised to return immediately if condition worsens.  Patient was advised to follow up with their primary care physician or other specialized physicians involved in their outpatient care. The patient and/or family member/power of attorney had laboratory results reviewed at the bedside. All questions and concerns were addressed and appropriate discharge instructions were distributed by the nursing staff.             Daymon Larsen, MD 01/03/16 [phone]

## 2016-01-03 NOTE — Discharge Instructions (Signed)
Please return immediately if condition worsens. Please contact her primary physician or the physician you were given for referral. If you have any specialist physicians involved in her treatment and plan please also contact them. Thank you for using [facility].  Return especially for fever, urinary complaints, persistent vomiting, or any other new concerns. Continue with over-the-counter pain medication

## 2016-01-05 DIAGNOSIS — R208 Other disturbances of skin sensation: Secondary | ICD-10-CM | POA: Diagnosis not present

## 2016-01-05 DIAGNOSIS — K5901 Slow transit constipation: Secondary | ICD-10-CM | POA: Diagnosis not present

## 2016-01-13 DIAGNOSIS — N803 Endometriosis of pelvic peritoneum: Secondary | ICD-10-CM | POA: Diagnosis not present

## 2016-01-13 DIAGNOSIS — R208 Other disturbances of skin sensation: Secondary | ICD-10-CM | POA: Diagnosis not present

## 2016-01-13 DIAGNOSIS — R61 Generalized hyperhidrosis: Secondary | ICD-10-CM | POA: Diagnosis not present

## 2016-02-01 DIAGNOSIS — L0201 Cutaneous abscess of face: Secondary | ICD-10-CM | POA: Diagnosis not present

## 2016-02-02 ENCOUNTER — Emergency Department
Admission: EM | Admit: 2016-02-02 | Discharge: 2016-02-02 | Disposition: A | Discharge status: Home / Self Care | Payer: 59 | Source: Home / Self Care | Attending: Emergency Medicine | Admitting: Emergency Medicine

## 2016-02-02 ENCOUNTER — Emergency Department: Payer: 59

## 2016-02-02 ENCOUNTER — Encounter: Payer: Self-pay | Admitting: Emergency Medicine

## 2016-02-02 ENCOUNTER — Inpatient Hospital Stay
Admission: EM | Admit: 2016-02-02 | Discharge: 2016-02-04 | DRG: 602 | Disposition: A | Discharge status: Home / Self Care | Payer: 59 | Attending: Internal Medicine | Admitting: Internal Medicine

## 2016-02-02 DIAGNOSIS — L0201 Cutaneous abscess of face: Secondary | ICD-10-CM

## 2016-02-02 DIAGNOSIS — G4700 Insomnia, unspecified: Secondary | ICD-10-CM | POA: Diagnosis not present

## 2016-02-02 DIAGNOSIS — F329 Major depressive disorder, single episode, unspecified: Secondary | ICD-10-CM | POA: Diagnosis present

## 2016-02-02 DIAGNOSIS — Z885 Allergy status to narcotic agent status: Secondary | ICD-10-CM | POA: Diagnosis not present

## 2016-02-02 DIAGNOSIS — Z87891 Personal history of nicotine dependence: Secondary | ICD-10-CM | POA: Insufficient documentation

## 2016-02-02 DIAGNOSIS — Z79899 Other long term (current) drug therapy: Secondary | ICD-10-CM

## 2016-02-02 DIAGNOSIS — L03211 Cellulitis of face: Secondary | ICD-10-CM | POA: Diagnosis not present

## 2016-02-02 DIAGNOSIS — E876 Hypokalemia: Secondary | ICD-10-CM | POA: Diagnosis not present

## 2016-02-02 DIAGNOSIS — L03213 Periorbital cellulitis: Secondary | ICD-10-CM | POA: Diagnosis not present

## 2016-02-02 DIAGNOSIS — R220 Localized swelling, mass and lump, head: Secondary | ICD-10-CM | POA: Diagnosis not present

## 2016-02-02 DIAGNOSIS — F419 Anxiety disorder, unspecified: Secondary | ICD-10-CM | POA: Diagnosis present

## 2016-02-02 DIAGNOSIS — E43 Unspecified severe protein-calorie malnutrition: Secondary | ICD-10-CM | POA: Diagnosis not present

## 2016-02-02 DIAGNOSIS — Z818 Family history of other mental and behavioral disorders: Secondary | ICD-10-CM | POA: Diagnosis not present

## 2016-02-02 DIAGNOSIS — L0291 Cutaneous abscess, unspecified: Secondary | ICD-10-CM

## 2016-02-02 LAB — CBC WITH DIFFERENTIAL/PLATELET
Basophils Absolute: 0.1 10*3/uL (ref 0–0.1)
Basophils Absolute: 0 10*3/uL (ref 0–0.1)
Basophils Relative: 1 %
Basophils Relative: 1 %
Eosinophils Absolute: 0.1 10*3/uL (ref 0–0.7)
Eosinophils Absolute: 0.1 10*3/uL (ref 0–0.7)
Eosinophils Relative: 1 %
Eosinophils Relative: 1 %
HCT: 35.6 % (ref 35.0–47.0)
HCT: 37.5 % (ref 35.0–47.0)
Hemoglobin: 12.4 g/dL (ref 12.0–16.0)
Hemoglobin: 12.6 g/dL (ref 12.0–16.0)
Lymphocytes Relative: 14 %
Lymphocytes Relative: 14 %
Lymphs Abs: 1.5 10*3/uL (ref 1.0–3.6)
Lymphs Abs: 1.5 10*3/uL (ref 1.0–3.6)
MCH: 33.4 pg (ref 26.0–34.0)
MCH: 32 pg (ref 26.0–34.0)
MCHC: 34.9 g/dL (ref 32.0–36.0)
MCHC: 33.6 g/dL (ref 32.0–36.0)
MCV: 95.6 fL (ref 80.0–100.0)
MCV: 95.4 fL (ref 80.0–100.0)
Monocytes Absolute: 0.8 10*3/uL (ref 0.2–0.9)
Monocytes Absolute: 0.6 10*3/uL (ref 0.2–0.9)
Monocytes Relative: 7 %
Monocytes Relative: 6 %
Neutro Abs: 8.3 10*3/uL — ABNORMAL HIGH (ref 1.4–6.5)
Neutro Abs: 8 10*3/uL — ABNORMAL HIGH (ref 1.4–6.5)
Neutrophils Relative %: 77 %
Neutrophils Relative %: 78 %
Platelets: 292 10*3/uL (ref 150–440)
Platelets: 319 10*3/uL (ref 150–440)
RBC: 3.72 MIL/uL — ABNORMAL LOW (ref 3.80–5.20)
RBC: 3.93 MIL/uL (ref 3.80–5.20)
RDW: 13 % (ref 11.5–14.5)
RDW: 13.1 % (ref 11.5–14.5)
WBC: 10.7 10*3/uL (ref 3.6–11.0)
WBC: 10.2 10*3/uL (ref 3.6–11.0)

## 2016-02-02 LAB — BASIC METABOLIC PANEL
Anion gap: 6 (ref 5–15)
BUN: 6 mg/dL (ref 6–20)
CO2: 29 mmol/L (ref 22–32)
Calcium: 10 mg/dL (ref 8.9–10.3)
Chloride: 104 mmol/L (ref 101–111)
Creatinine, Ser: 0.71 mg/dL (ref 0.44–1.00)
GFR calc Af Amer: >60 mL/min (ref >60)
GFR calc non Af Amer: >60 mL/min (ref >60)
Glucose, Bld: 93 mg/dL (ref 65–99)
Potassium: 3.4 mmol/L — ABNORMAL LOW (ref 3.5–5.1)
Sodium: 139 mmol/L (ref 135–145)

## 2016-02-02 MED ORDER — IBUPROFEN 600 MG PO TABS
600.0000 mg | ORAL_TABLET | Freq: Once | ORAL | Status: AC
  Administered 2016-02-02: 600 mg via ORAL
  Filled 2016-02-02: qty 1

## 2016-02-02 MED ORDER — IOPAMIDOL (ISOVUE-300) INJECTION 61%
75.0000 mL | Freq: Once | INTRAVENOUS | Status: AC | PRN
  Administered 2016-02-02: 75 mL via INTRAVENOUS

## 2016-02-02 MED ORDER — CLINDAMYCIN PHOSPHATE 600 MG/50ML IV SOLN
600.0000 mg | Freq: Once | INTRAVENOUS | Status: AC
  Administered 2016-02-02: 600 mg via INTRAVENOUS
  Filled 2016-02-02: qty 50

## 2016-02-02 MED ORDER — TRAMADOL HCL 50 MG PO TABS
50.0000 mg | ORAL_TABLET | Freq: Once | ORAL | Status: AC
  Administered 2016-02-02: 50 mg via ORAL
  Filled 2016-02-02: qty 1

## 2016-02-02 MED ORDER — FENTANYL CITRATE (PF) 100 MCG/2ML IJ SOLN
25.0000 ug | Freq: Once | INTRAMUSCULAR | Status: AC
  Administered 2016-02-02: 25 ug via INTRAVENOUS
  Filled 2016-02-02: qty 2

## 2016-02-02 MED ORDER — ONDANSETRON HCL 4 MG/2ML IJ SOLN
4.0000 mg | Freq: Once | INTRAMUSCULAR | Status: AC
  Administered 2016-02-02: 4 mg via INTRAVENOUS
  Filled 2016-02-02: qty 2

## 2016-02-02 MED ORDER — IBUPROFEN 50 MG PO CHEW: 50.0000 mg | CHEWABLE_TABLET | Freq: Three times a day (TID) | ORAL | 2 refills | Status: AC | PRN

## 2016-02-02 MED ORDER — VANCOMYCIN HCL IN DEXTROSE 1-5 GM/200ML-% IV SOLN
1000.0000 mg | Freq: Once | INTRAVENOUS | Status: AC
  Administered 2016-02-02: 1000 mg via INTRAVENOUS
  Filled 2016-02-02: qty 200

## 2016-02-02 MED ORDER — LIDOCAINE HCL (PF) 1 % IJ SOLN
INTRAMUSCULAR | Status: AC
  Filled 2016-02-02: qty 5

## 2016-02-02 MED ORDER — CLINDAMYCIN HCL 150 MG PO CAPS: 150.0000 mg | ORAL_CAPSULE | Freq: Four times a day (QID) | ORAL | 0 refills | Status: DC

## 2016-02-02 NOTE — ED Provider Notes (Signed)
[facility] Emergency Department Provider Note    First MD Initiated Contact with Patient 02/02/16 1130     (approximate)  I have reviewed the triage vital signs and the nursing notes.   HISTORY  Chief Complaint Facial Swelling and Abscess    HPI Carol Mooney is a 40 y.o. female who presents with history of possible abscess to the left side of the face. Patient states that 4 days ago she noted a "pimple in that area that rapidly progressed". Patient stated swelling discomfort and eventually drainage and seafood. Patient was seen at urgent care facility and prescribed doxycycline. Patient states that symptoms continue to worsen despite taking doxycycline as prescribed which resulted in her presenting to the emergency department earlier today at which point she was given IV clindamycin and prescribed clindamycin at home. Patient now returns to the emergency department with continued worsening of symptoms mainly swelling extending to the inferior portion of the left eye discomfort and drainage. Patient denies any fever. Afebrile on presentation temperature 98.4.   Past Medical History:  Diagnosis Date  . Allergy   . Complication of anesthesia    nausea and vomiting  . Depression   . PONV (postoperative nausea and vomiting)     Patient Active Problem List   Diagnosis Date Noted  . Post-operative state 12/07/2015  . Endometriosis 09/01/2015  . Clinical depression 10/13/2014    Past Surgical History:  Procedure Laterality Date  . c sections    . HYSTERECTOMY ABDOMINAL WITH SALPINGECTOMY Bilateral 12/07/2015   Procedure: HYSTERECTOMY ABDOMINAL WITH SALPINGECTOMY/EXCISION OF ENDOMETRIOS;  Surgeon: Boykin Nearing, MD;  Location: [facility];  Service: Gynecology;  Laterality: Bilateral;  . OOPHORECTOMY Bilateral 12/07/2015   Procedure: OOPHORECTOMY;  Surgeon: Boykin Nearing, MD;  Location: [facility];  Service: Gynecology;  Laterality: Bilateral;    . TUBAL LIGATION  2000  . UTERINE STENT PLACEMENT Bilateral 12/07/2015   Procedure: UTERINE STENT PLACEMENT/REMOVAL;  Surgeon: Boykin Nearing, MD;  Location: [facility];  Service: Gynecology;  Laterality: Bilateral;    Prior to Admission medications   Medication Sig Start Date End Date Taking? Authorizing Provider  acetaminophen (TYLENOL) 325 MG tablet Take 650 mg by mouth every 6 (six) hours as needed.    Historical Provider, MD  ascorbic acid (VITAMIN C) 500 MG tablet Take 500 mg by mouth daily.    Historical Provider, MD  buPROPion (WELLBUTRIN SR) 150 MG 12 hr tablet Take 1 tablet (150 mg total) by mouth 2 (two) times daily. 11/05/15   Carmon Ginsberg, PA  Calcium Carb-Cholecalciferol (CALCIUM + D3) 600-200 MG-UNIT TABS Take 1 tablet by mouth daily.    Historical Provider, MD  clindamycin (CLEOCIN) 150 MG capsule Take 1 capsule (150 mg total) by mouth 4 (four) times daily. 02/02/16   Sable Feil, PA-C  dicyclomine (BENTYL) 20 MG tablet Take 1 tablet (20 mg total) by mouth 3 (three) times daily as needed for spasms. 01/03/16   Daymon Larsen, MD  docusate sodium (COLACE) 100 MG capsule Take 1 capsule (100 mg total) by mouth 2 (two) times daily. 12/10/15   Gwen Her Schermerhorn, MD  ibuprofen (ADVIL,MOTRIN) 50 MG chewable tablet Chew 1 tablet (50 mg total) by mouth every 8 (eight) hours as needed for fever. 02/02/16 02/01/17  Sable Feil, PA-C  ibuprofen (ADVIL,MOTRIN) 800 MG tablet Take 1 tablet (800 mg total) by mouth 3 (three) times daily as needed for moderate pain. 12/10/15   Boykin Nearing, MD  ondansetron (ZOFRAN) 8 MG tablet Take 1 tablet (8 mg total) by mouth every 8 (eight) hours as needed for nausea or vomiting. 12/10/15   Boykin Nearing, MD  oxyCODONE-acetaminophen (PERCOCET/ROXICET) 5-325 MG tablet Take 1-2 tablets by mouth every 4 (four) hours as needed for moderate pain. 12/10/15   Gwen Her Schermerhorn, MD  polyethylene glycol (MIRALAX / GLYCOLAX) packet Take 17  g by mouth daily.    Historical Provider, MD  senna (SENOKOT) 8.6 MG TABS tablet Take 1 tablet by mouth daily as needed for mild constipation.    Historical Provider, MD  traZODone (DESYREL) 50 MG tablet Take 1 tablet (50 mg total) by mouth at bedtime as needed for sleep (as needed for sleep). 11/05/15   Carmon Ginsberg, PA  valACYclovir (VALTREX) 1000 MG tablet Two pills every 12 hours for one day at onset of cold sore Patient not taking: Reported on 12/07/2015 11/05/15   Carmon Ginsberg, PA    Allergies Morphine and related  Family History  Problem Relation Age of Onset  . Coronary artery disease Father   . Depression Father   . Diabetes Father     insulin dependent  . Depression Brother   . Cancer Maternal Grandmother     breast  . Breast cancer Maternal Grandmother 52  . Depression Sister   . Breast cancer Paternal Grandmother 63    Social History Social History  Substance Use Topics  . Smoking status: Former Smoker    Quit date: 05/19/2013  . Smokeless tobacco: Never Used  . Alcohol use 1.2 oz/week    2 Shots of liquor per week     Comment: very rarely    Review of Systems Constitutional: No fever/chills Eyes: No visual changes. ENT: No sore throat. Cardiovascular: Denies chest pain. Respiratory: Denies shortness of breath. Gastrointestinal: No abdominal pain.  No nausea, no vomiting.  No diarrhea.  No constipation. Genitourinary: Negative for dysuria. Musculoskeletal: Negative for back pain. Skin: Negative for rash.Positive for left facial swelling pustule with purulent drainage and erythema Neurological: Negative for headaches, focal weakness or numbness.  10-point ROS otherwise negative.  ____________________________________________   PHYSICAL EXAM:  VITAL SIGNS: ED Triage Vitals [02/02/16 2108]  Enc Vitals Group     BP 101/67     Pulse Rate 94     Resp 18     Temp 98.4 F (36.9 C)     Temp Source Oral     SpO2 100 %     Weight 160 lb (72.6 kg)      Height 5\' 6"  (1.676 m)     Head Circumference      Peak Flow      Pain Score 10     Pain Loc      Pain Edu?      Excl. in [place]?     Constitutional: Alert and oriented. Well appearing and in no acute distress. Eyes: Conjunctivae are normal. PERRL. EOMI. Head: Atraumatic. Ears:  Healthy appearing ear canals and TMs bilaterally Nose: No congestion/rhinnorhea. Mouth/Throat: Mucous membranes are moist.  Oropharynx non-erythematous. Neck: No stridor.  No meningeal signs.   Cardiovascular: Normal rate, regular rhythm. Good peripheral circulation. Grossly normal heart sounds. Respiratory: Normal respiratory effort.  No retractions. Lungs CTAB. Gastrointestinal: Soft and nontender. No distention.  Musculoskeletal: No lower extremity tenderness nor edema. No gross deformities of extremities. Neurologic:  Normal speech and language. No gross focal neurologic deficits are appreciated.  Skin:  Pustule noted left [facility] portion of the face with  surrounding swelling extending to the inferior portion of the left eye with overlying erythema Psychiatric: Mood and affect are normal. Speech and behavior are normal. ____________________________________________   LABS (all labs ordered are listed, but only abnormal results are displayed)  Labs Reviewed  BASIC METABOLIC PANEL - Abnormal; Notable for the following:       Result Value   Potassium 3.4 (*)    All other components within normal limits  CBC WITH DIFFERENTIAL/PLATELET - Abnormal; Notable for the following:    Neutro Abs 8.0 (*)    All other components within normal limits    RADIOLOGY I, Burley N Kahleel Fadeley, personally viewed and evaluated these images (plain radiographs) as part of my medical decision making, as well as reviewing the written report by the radiologist.  Ct Maxillofacial W Contrast  Result Date: 02/02/2016 CLINICAL DATA:  Worsening swelling about the left eye spreading to the cheek. EXAM: CT MAXILLOFACIAL WITH CONTRAST  TECHNIQUE: Multidetector CT imaging of the maxillofacial structures was performed with intravenous contrast. Multiplanar CT image reconstructions were also generated. A small metallic BB was placed on the right temple in order to reliably differentiate right from left. CONTRAST:  52mL ISOVUE-300 IOPAMIDOL (ISOVUE-300) INJECTION 61% COMPARISON:  None. FINDINGS: Osseous: No fracture or mandibular dislocation. No destructive process. Orbits: The globes appear symmetric. There is left periorbital and facial soft tissue inflammation and edema consistent with cellulitis. This tracks from the left orbit T left submandibular soft tissues. No postseptal abnormality identified. Sinuses: Negative Soft tissues: As above Limited intracranial: Negative IMPRESSION: Left periorbital and facial soft tissue inflammation consistent with preseptal cellulitis. No drainable abscess. Electronically Signed   By: Ashley Royalty M.D.   On: 02/02/2016 23:32     Procedures     INITIAL IMPRESSION / ASSESSMENT AND PLAN / ED COURSE  Pertinent labs & imaging results that were available during my care of the patient were reviewed by me and considered in my medical decision making (see chart for details).  History physical exam consistent with preseptal cellulitis most likely etiology MRSA. Patient will be given IV vancomycin here in the emergency department with plan for admission for continued IV antibiotics.   Clinical Course    ____________________________________________  FINAL CLINICAL IMPRESSION(S) / ED DIAGNOSES  Final diagnoses:  Abscess of face  Preseptal cellulitis of left eye  Facial cellulitis     MEDICATIONS GIVEN DURING THIS VISIT:  Medications  iopamidol (ISOVUE-300) 61 % injection 75 mL (75 mLs Intravenous Contrast Given 02/02/16 2233)     NEW OUTPATIENT MEDICATIONS STARTED DURING THIS VISIT:  New Prescriptions   No medications on file    Modified Medications   No medications on file     Discontinued Medications   No medications on file     Note:  This document was prepared using Dragon voice recognition software and may include unintentional dictation errors.    Gregor Hams, MD 02/03/16 [phone]

## 2016-02-02 NOTE — ED Provider Notes (Signed)
[facility] Emergency Department Provider Note   ____________________________________________   First MD Initiated Contact with Patient 02/02/16 1028     (approximate)  I have reviewed the triage vital signs and the nursing notes.   HISTORY  Chief Complaint Abscess    HPI Carol Mooney is a 40 y.o. female patient presents with abscess to the left side of her face. Patient states she was [facility] and started on antibiotics after given Rocephin IM. Patient states she was given pain medication. Patient state the clinical staff decided not to open and draining abscess. Patient states the lesion has worsening with more edema and erythema. Patient also noticed discharge from the abscess. Patient states she was placed on doxycycline and given Percocets. Patient rates the pain today as a 9/10. Patient describes the pain as "sharp". No other palliative measures for this complaint.   Past Medical History:  Diagnosis Date  . Allergy   . Complication of anesthesia    nausea and vomiting  . Depression   . PONV (postoperative nausea and vomiting)     Patient Active Problem List   Diagnosis Date Noted  . Post-operative state 12/07/2015  . Endometriosis 09/01/2015  . Clinical depression 10/13/2014    Past Surgical History:  Procedure Laterality Date  . c sections    . HYSTERECTOMY ABDOMINAL WITH SALPINGECTOMY Bilateral 12/07/2015   Procedure: HYSTERECTOMY ABDOMINAL WITH SALPINGECTOMY/EXCISION OF ENDOMETRIOS;  Surgeon: Boykin Nearing, MD;  Location: [facility] ORS;  Service: Gynecology;  Laterality: Bilateral;  . OOPHORECTOMY Bilateral 12/07/2015   Procedure: OOPHORECTOMY;  Surgeon: Boykin Nearing, MD;  Location: [facility] ORS;  Service: Gynecology;  Laterality: Bilateral;  . TUBAL LIGATION  2000  . UTERINE STENT PLACEMENT Bilateral 12/07/2015   Procedure: UTERINE STENT PLACEMENT/REMOVAL;  Surgeon: Boykin Nearing, MD;  Location:  [facility] ORS;  Service: Gynecology;  Laterality: Bilateral;    Prior to Admission medications   Medication Sig Start Date End Date Taking? Authorizing Provider  acetaminophen (TYLENOL) 325 MG tablet Take 650 mg by mouth every 6 (six) hours as needed.    Historical Provider, MD  ascorbic acid (VITAMIN C) 500 MG tablet Take 500 mg by mouth daily.    Historical Provider, MD  buPROPion (WELLBUTRIN SR) 150 MG 12 hr tablet Take 1 tablet (150 mg total) by mouth 2 (two) times daily. 11/05/15   Carmon Ginsberg, PA  Calcium Carb-Cholecalciferol (CALCIUM + D3) 600-200 MG-UNIT TABS Take 1 tablet by mouth daily.    Historical Provider, MD  clindamycin (CLEOCIN) 150 MG capsule Take 1 capsule (150 mg total) by mouth 4 (four) times daily. 02/02/16   Sable Feil, PA-C  dicyclomine (BENTYL) 20 MG tablet Take 1 tablet (20 mg total) by mouth 3 (three) times daily as needed for spasms. 01/03/16   Daymon Larsen, MD  docusate sodium (COLACE) 100 MG capsule Take 1 capsule (100 mg total) by mouth 2 (two) times daily. 12/10/15   Gwen Her Schermerhorn, MD  ibuprofen (ADVIL,MOTRIN) 50 MG chewable tablet Chew 1 tablet (50 mg total) by mouth every 8 (eight) hours as needed for fever. 02/02/16 02/01/17  Sable Feil, PA-C  ibuprofen (ADVIL,MOTRIN) 800 MG tablet Take 1 tablet (800 mg total) by mouth 3 (three) times daily as needed for moderate pain. 12/10/15   Gwen Her Schermerhorn, MD  ondansetron (ZOFRAN) 8 MG tablet Take 1 tablet (8 mg total) by mouth every 8 (eight) hours as needed for nausea or vomiting. 12/10/15  Gwen Her Schermerhorn, MD  oxyCODONE-acetaminophen (PERCOCET/ROXICET) 5-325 MG tablet Take 1-2 tablets by mouth every 4 (four) hours as needed for moderate pain. 12/10/15   Gwen Her Schermerhorn, MD  polyethylene glycol (MIRALAX / GLYCOLAX) packet Take 17 g by mouth daily.    Historical Provider, MD  senna (SENOKOT) 8.6 MG TABS tablet Take 1 tablet by mouth daily as needed for mild constipation.    Historical Provider,  MD  traZODone (DESYREL) 50 MG tablet Take 1 tablet (50 mg total) by mouth at bedtime as needed for sleep (as needed for sleep). 11/05/15   Carmon Ginsberg, PA  valACYclovir (VALTREX) 1000 MG tablet Two pills every 12 hours for one day at onset of cold sore Patient not taking: Reported on 12/07/2015 11/05/15   Carmon Ginsberg, PA    Allergies Morphine and related  Family History  Problem Relation Age of Onset  . Coronary artery disease Father   . Depression Father   . Diabetes Father     insulin dependent  . Depression Brother   . Cancer Maternal Grandmother     breast  . Breast cancer Maternal Grandmother 23  . Depression Sister   . Breast cancer Paternal Grandmother 70    Social History Social History  Substance Use Topics  . Smoking status: Former Smoker    Quit date: 05/19/2013  . Smokeless tobacco: Never Used  . Alcohol use 1.2 oz/week    2 Shots of liquor per week     Comment: very rarely    Review of Systems Constitutional: No fever/chills Eyes: No visual changes. ENT: No sore throat. Cardiovascular: Denies chest pain. Respiratory: Denies shortness of breath. Gastrointestinal: No abdominal pain.  No nausea, no vomiting.  No diarrhea.  No constipation. Genitourinary: Negative for dysuria. Musculoskeletal: Negative for back pain. Skin: Negative for rash.Greatest swelling to the left maxillary area. Neurological: Negative for headaches, focal weakness or numbness. Psychiatric:Depression   ____________________________________________   PHYSICAL EXAM:  VITAL SIGNS: ED Triage Vitals  Enc Vitals Group     BP 02/02/16 1013 (!) 120/99     Pulse Rate 02/02/16 1013 (!) 104     Resp 02/02/16 1013 18     Temp 02/02/16 1013 98.5 F (36.9 C)     Temp Source 02/02/16 1013 Oral     SpO2 02/02/16 1013 100 %     Weight 02/02/16 1014 160 lb (72.6 kg)     Height --      Head Circumference --      Peak Flow --      Pain Score 02/02/16 1014 9     Pain Loc --      Pain  Edu? --      Excl. in [place]? --     Constitutional: Alert and oriented. Well appearing and in no acute distress. Eyes: Conjunctivae are normal. PERRL. EOMI. Head: Atraumatic. Nose: No congestion/rhinnorhea. Mouth/Throat: Mucous membranes are moist.  Oropharynx non-erythematous. Neck: No stridor.  No cervical spine tenderness to palpation. Hematological/Lymphatic/Immunilogical: No cervical lymphadenopathy. Cardiovascular: Normal rate, regular rhythm. Grossly normal heart sounds.  Good peripheral circulation. Respiratory: Normal respiratory effort.  No retractions. Lungs CTAB. Gastrointestinal: Soft and nontender. No distention. No abdominal bruits. No CVA tenderness. Musculoskeletal: No lower extremity tenderness nor edema.  No joint effusions. Neurologic:  Normal speech and language. No gross focal neurologic deficits are appreciated. No gait instability. Skin:  Skin is warm, dry and intact. No rash noted. Erythematous and edematous nausea lesion left maxillary area. Psychiatric: Mood and  affect are normal. Speech and behavior are normal.  ____________________________________________   LABS (all labs ordered are listed, but only abnormal results are displayed)  Labs Reviewed  CBC WITH DIFFERENTIAL/PLATELET - Abnormal; Notable for the following:       Result Value   RBC 3.72 (*)    Neutro Abs 8.3 (*)    All other components within normal limits  AEROBIC CULTURE (SUPERFICIAL SPECIMEN)   ____________________________________________  EKG   ____________________________________________  RADIOLOGY   ____________________________________________   PROCEDURES  Procedure(s) performed: INCISION AND DRAINAGE Performed by: Sable Feil Consent: Verbal consent obtained. Risks and benefits: risks, benefits and alternatives were discussed Type: abscess  Body area: Left facial area  Anesthesia: local infiltration  Incision was made with a scalpel.  Local anesthetic: lidocaine  1% without epinephrine  Anesthetic total:67ml  Complexity: complex Blunt dissection to break up loculations  Drainage: purulent  Drainage amount: Small  Patient tolerance: Patient tolerated the procedure well with no immediate complications.     Procedures  Critical Care performed: No  ____________________________________________   INITIAL IMPRESSION / ASSESSMENT AND PLAN / ED COURSE  Pertinent labs & imaging results that were available during my care of the patient were reviewed by me and considered in my medical decision making (see chart for details).  Facial abscess. Patient given discharge care instructions. Patient given a prescription for clindamycin and ibuprofen. Patient advised to continue her pain medication as needed. Patient advised to follow-up with PCP in 2 days. Patient advised return by ER for condition worsens.  Clinical Course  Patient's lesion was incised and drained. Patient given IV clindamycin.   ____________________________________________   FINAL CLINICAL IMPRESSION(S) / ED DIAGNOSES  Final diagnoses:  Abscess      NEW MEDICATIONS STARTED DURING THIS VISIT:  New Prescriptions   CLINDAMYCIN (CLEOCIN) 150 MG CAPSULE    Take 1 capsule (150 mg total) by mouth 4 (four) times daily.   IBUPROFEN (ADVIL,MOTRIN) 50 MG CHEWABLE TABLET    Chew 1 tablet (50 mg total) by mouth every 8 (eight) hours as needed for fever.     Note:  This document was prepared using Dragon voice recognition software and may include unintentional dictation errors.    Sable Feil, PA-C 02/02/16 [place], MD 02/03/16 [phone]

## 2016-02-02 NOTE — ED Triage Notes (Signed)
Pt presents to ED with worsening swelling to her left eye. Pt states she was seen by urgent care monday for a possible abscess to the left side of her face and given IM and oral doxy. Pt states affected area became increasingly painful and swollen during the night so pt came to ED this morning and was seen by PA. I and D performed and IV and prescription for oral clindamycin given. Pt states the swelling has now spread below her eye and down into her cheek. Unsure of fever at home.

## 2016-02-02 NOTE — ED Triage Notes (Signed)
Pt to ed with c/o abscess to left side of face.  Pt states seen yesterday at urgent care and started on abx, reports worse today.

## 2016-02-02 NOTE — ED Notes (Signed)
MD at bedside. 

## 2016-02-02 NOTE — ED Notes (Signed)
See triage note  States she developed an red raised area to left side of face couple of days ago  Was seen at urgent care yesterday  And placed on doxy  States area is worse today

## 2016-02-02 NOTE — Discharge Instructions (Signed)
Discontinue doxycycline and start clindamycin as directed. Be advised culture and sensitivity is pending. Will be notified telephonically if we need to change the antibiotics.

## 2016-02-03 DIAGNOSIS — F329 Major depressive disorder, single episode, unspecified: Secondary | ICD-10-CM | POA: Diagnosis not present

## 2016-02-03 DIAGNOSIS — E876 Hypokalemia: Secondary | ICD-10-CM | POA: Diagnosis not present

## 2016-02-03 DIAGNOSIS — Z87891 Personal history of nicotine dependence: Secondary | ICD-10-CM | POA: Diagnosis not present

## 2016-02-03 DIAGNOSIS — Z885 Allergy status to narcotic agent status: Secondary | ICD-10-CM | POA: Diagnosis not present

## 2016-02-03 DIAGNOSIS — Z818 Family history of other mental and behavioral disorders: Secondary | ICD-10-CM | POA: Diagnosis not present

## 2016-02-03 DIAGNOSIS — L03213 Periorbital cellulitis: Secondary | ICD-10-CM | POA: Diagnosis present

## 2016-02-03 DIAGNOSIS — L0201 Cutaneous abscess of face: Secondary | ICD-10-CM | POA: Diagnosis not present

## 2016-02-03 DIAGNOSIS — E43 Unspecified severe protein-calorie malnutrition: Secondary | ICD-10-CM | POA: Insufficient documentation

## 2016-02-03 DIAGNOSIS — G4700 Insomnia, unspecified: Secondary | ICD-10-CM | POA: Diagnosis present

## 2016-02-03 DIAGNOSIS — F419 Anxiety disorder, unspecified: Secondary | ICD-10-CM | POA: Diagnosis present

## 2016-02-03 DIAGNOSIS — Z79899 Other long term (current) drug therapy: Secondary | ICD-10-CM | POA: Diagnosis not present

## 2016-02-03 DIAGNOSIS — L0202 Furuncle of face: Secondary | ICD-10-CM | POA: Diagnosis not present

## 2016-02-03 DIAGNOSIS — L03211 Cellulitis of face: Secondary | ICD-10-CM | POA: Diagnosis not present

## 2016-02-03 LAB — CBC
HCT: 36.7 % (ref 35.0–47.0)
Hemoglobin: 12.6 g/dL (ref 12.0–16.0)
MCH: 33.4 pg (ref 26.0–34.0)
MCHC: 34.2 g/dL (ref 32.0–36.0)
MCV: 97.5 fL (ref 80.0–100.0)
Platelets: 310 10*3/uL (ref 150–440)
RBC: 3.76 MIL/uL — ABNORMAL LOW (ref 3.80–5.20)
RDW: 13.3 % (ref 11.5–14.5)
WBC: 8.9 10*3/uL (ref 3.6–11.0)

## 2016-02-03 LAB — BASIC METABOLIC PANEL
Anion gap: 5 (ref 5–15)
BUN: <5 mg/dL — ABNORMAL LOW (ref 6–20)
CO2: 27 mmol/L (ref 22–32)
Calcium: 9.9 mg/dL (ref 8.9–10.3)
Chloride: 108 mmol/L (ref 101–111)
Creatinine, Ser: 0.61 mg/dL (ref 0.44–1.00)
GFR calc Af Amer: >60 mL/min (ref >60)
GFR calc non Af Amer: >60 mL/min (ref >60)
Glucose, Bld: 97 mg/dL (ref 65–99)
Potassium: 3.3 mmol/L — ABNORMAL LOW (ref 3.5–5.1)
Sodium: 140 mmol/L (ref 135–145)

## 2016-02-03 MED ORDER — VANCOMYCIN HCL IN DEXTROSE 1-5 GM/200ML-% IV SOLN
1000.0000 mg | Freq: Two times a day (BID) | INTRAVENOUS | Status: DC
  Administered 2016-02-03: 06:00:00 1000 mg via INTRAVENOUS
  Filled 2016-02-03 (×2): qty 200

## 2016-02-03 MED ORDER — SENNA 8.6 MG PO TABS
1.0000 | ORAL_TABLET | Freq: Every day | ORAL | Status: DC | PRN
  Administered 2016-02-03 – 2016-02-04 (×2): 8.6 mg via ORAL
  Filled 2016-02-03 (×2): qty 1

## 2016-02-03 MED ORDER — DICYCLOMINE HCL 10 MG PO CAPS: 20.0000 mg | ORAL_CAPSULE | Freq: Three times a day (TID) | ORAL | Status: DC | PRN

## 2016-02-03 MED ORDER — KETOROLAC TROMETHAMINE 15 MG/ML IJ SOLN
15.0000 mg | Freq: Four times a day (QID) | INTRAMUSCULAR | Status: DC | PRN
  Administered 2016-02-03 – 2016-02-04 (×4): 15 mg via INTRAVENOUS
  Filled 2016-02-03 (×4): qty 1

## 2016-02-03 MED ORDER — ACETAMINOPHEN 325 MG PO TABS: 650.0000 mg | ORAL_TABLET | Freq: Four times a day (QID) | ORAL | Status: DC | PRN

## 2016-02-03 MED ORDER — CALCIUM + D3 600-200 MG-UNIT PO TABS: 1.0000 | ORAL_TABLET | Freq: Every day | ORAL | Status: DC

## 2016-02-03 MED ORDER — ONDANSETRON HCL 4 MG PO TABS: 4.0000 mg | ORAL_TABLET | Freq: Four times a day (QID) | ORAL | Status: DC | PRN

## 2016-02-03 MED ORDER — SODIUM CHLORIDE 0.9 % IV SOLN
INTRAVENOUS | Status: DC
  Administered 2016-02-03: 1000 mL via INTRAVENOUS

## 2016-02-03 MED ORDER — BUPROPION HCL ER (SR) 150 MG PO TB12
150.0000 mg | ORAL_TABLET | Freq: Two times a day (BID) | ORAL | Status: DC
  Administered 2016-02-03 – 2016-02-04 (×4): 150 mg via ORAL
  Filled 2016-02-03 (×5): qty 1

## 2016-02-03 MED ORDER — CALCIUM CARBONATE-VITAMIN D 500-200 MG-UNIT PO TABS
1.0000 | ORAL_TABLET | Freq: Every day | ORAL | Status: DC
  Administered 2016-02-03 – 2016-02-04 (×2): 1 via ORAL
  Filled 2016-02-03 (×2): qty 1

## 2016-02-03 MED ORDER — ACETAMINOPHEN 650 MG RE SUPP: 650.0000 mg | Freq: Four times a day (QID) | RECTAL | Status: DC | PRN

## 2016-02-03 MED ORDER — DOCUSATE SODIUM 100 MG PO CAPS
100.0000 mg | ORAL_CAPSULE | Freq: Every day | ORAL | Status: DC
  Administered 2016-02-03 – 2016-02-04 (×2): 100 mg via ORAL
  Filled 2016-02-03 (×2): qty 1

## 2016-02-03 MED ORDER — POLYETHYLENE GLYCOL 3350 17 G PO PACK
17.0000 g | PACK | Freq: Every day | ORAL | Status: DC
  Administered 2016-02-03 – 2016-02-04 (×2): 17 g via ORAL
  Filled 2016-02-03 (×2): qty 1

## 2016-02-03 MED ORDER — ONDANSETRON HCL 4 MG/2ML IJ SOLN
4.0000 mg | Freq: Four times a day (QID) | INTRAMUSCULAR | Status: DC | PRN
  Administered 2016-02-03 – 2016-02-04 (×2): 4 mg via INTRAVENOUS
  Filled 2016-02-03 (×2): qty 2

## 2016-02-03 MED ORDER — DOXYCYCLINE HYCLATE 100 MG PO TABS
100.0000 mg | ORAL_TABLET | Freq: Two times a day (BID) | ORAL | Status: DC
  Administered 2016-02-03 – 2016-02-04 (×3): 100 mg via ORAL
  Filled 2016-02-03 (×3): qty 1

## 2016-02-03 MED ORDER — TRAZODONE HCL 50 MG PO TABS
50.0000 mg | ORAL_TABLET | Freq: Every evening | ORAL | Status: DC | PRN
  Administered 2016-02-03 (×2): 50 mg via ORAL
  Filled 2016-02-03 (×2): qty 1

## 2016-02-03 MED ORDER — CALCIUM CARBONATE ANTACID 500 MG PO CHEW
1.0000 | CHEWABLE_TABLET | Freq: Two times a day (BID) | ORAL | Status: DC
  Administered 2016-02-03 – 2016-02-04 (×3): 200 mg via ORAL
  Filled 2016-02-03 (×3): qty 1

## 2016-02-03 MED ORDER — BISACODYL 5 MG PO TBEC
5.0000 mg | DELAYED_RELEASE_TABLET | Freq: Every day | ORAL | Status: DC | PRN
  Administered 2016-02-03: 21:00:00 5 mg via ORAL
  Filled 2016-02-03: qty 1

## 2016-02-03 MED ORDER — HYDROCODONE-ACETAMINOPHEN 5-325 MG PO TABS
1.0000 | ORAL_TABLET | Freq: Four times a day (QID) | ORAL | Status: DC | PRN
  Administered 2016-02-03: 09:00:00 2 via ORAL
  Administered 2016-02-03: 1 via ORAL
  Administered 2016-02-03: 21:00:00 2 via ORAL
  Administered 2016-02-03: 03:00:00 1 via ORAL
  Administered 2016-02-03 – 2016-02-04 (×2): 2 via ORAL
  Filled 2016-02-03 (×2): qty 2
  Filled 2016-02-03: qty 1
  Filled 2016-02-03: qty 2
  Filled 2016-02-03: qty 1
  Filled 2016-02-03: qty 2

## 2016-02-03 MED ORDER — CLINDAMYCIN PHOSPHATE 600 MG/50ML IV SOLN
600.0000 mg | Freq: Three times a day (TID) | INTRAVENOUS | Status: DC
  Administered 2016-02-03 – 2016-02-04 (×3): 600 mg via INTRAVENOUS
  Filled 2016-02-03 (×5): qty 50

## 2016-02-03 MED ORDER — POTASSIUM CHLORIDE CRYS ER 20 MEQ PO TBCR
20.0000 meq | EXTENDED_RELEASE_TABLET | Freq: Two times a day (BID) | ORAL | Status: AC
  Administered 2016-02-03 (×2): 20 meq via ORAL
  Filled 2016-02-03 (×2): qty 1

## 2016-02-03 MED ORDER — GENTAMICIN SULFATE 0.1 % EX OINT
TOPICAL_OINTMENT | Freq: Three times a day (TID) | CUTANEOUS | Status: DC
  Administered 2016-02-03 – 2016-02-04 (×2): via TOPICAL
  Filled 2016-02-03: qty 15

## 2016-02-03 MED ORDER — VITAMIN C 500 MG PO TABS
500.0000 mg | ORAL_TABLET | Freq: Every day | ORAL | Status: DC
  Administered 2016-02-03 – 2016-02-04 (×2): 500 mg via ORAL
  Filled 2016-02-03 (×2): qty 1

## 2016-02-03 NOTE — Progress Notes (Signed)
Pharmacy Antibiotic Note  Carol Mooney is a 40 y.o. female admitted on 02/02/2016 with cellulitis.  Pharmacy has been consulted for vancomycin dosing.  Plan: DW 63kg  Vd 44L kei 0.081 hr-1  T1/2 9 hours Vancomycin 1 gram q 12 hours ordered with stacked dosing. Level before 5th dose. Goal trough 10-15.    Height: 5\' 4"  (162.6 cm) Weight: 166 lb 4.8 oz (75.4 kg) IBW/kg (Calculated) : 54.7  Temp (24hrs), Avg:98.3 F (36.8 C), Min:97.9 F (36.6 C), Max:98.5 F (36.9 C)   Recent Labs Lab 02/02/16 1042 02/02/16 2133  WBC 10.7 10.2  CREATININE  --  0.71    Estimated Creatinine Clearance: 92 mL/min (by C-G formula based on SCr of 0.71 mg/dL).    Allergies  Allergen Reactions  . Morphine And Related Itching    Antimicrobials this admission: vancomycin  >>    >>   Dose adjustments this admission:   Microbiology results: 10/17 Wound cx: pending  Thank you for allowing pharmacy to be a part of this patient's care.  Lonny Eisen S 02/03/2016 2:04 AM

## 2016-02-03 NOTE — Progress Notes (Signed)
Pharmacy Antibiotic Note  Carol Mooney is a 40 y.o. female admitted on 02/02/2016 with preseptal cellulitis.  Pharmacy has been consulted for vancomycin dosing.  Plan: Pt received vancomycin 1 g IV x 1 in ED. Will continue patient on vancomycin 1 g IV q 12 hrs. Per MD note, there is left facial swelling pustule with purulent drainage and erythema. Will target trough of 15-20. Trough is ordered prior to 5th dose.  Kinetics: AdjBW: 63 kg; AdjBW CrCl = 92 Ke = 0.081; t1/2 8.56; Vd = 44; cmin at steady state predicted to be 16.56  Height: 5\' 4"  (162.6 cm) Weight: 166 lb 4.8 oz (75.4 kg) IBW/kg (Calculated) : 54.7  Temp (24hrs), Avg:98.2 F (36.8 C), Min:97.9 F (36.6 C), Max:98.5 F (36.9 C)   Recent Labs Lab 02/02/16 1042 02/02/16 2133 02/03/16 0521  WBC 10.7 10.2 8.9  CREATININE  --  0.71 0.61    Estimated Creatinine Clearance: 92 mL/min (by C-G formula based on SCr of 0.61 mg/dL).    Allergies  Allergen Reactions  . Morphine And Related Itching    Antimicrobials this admission: Clindamycin x 1 Vanc 10/17 >>  Dose adjustments this admission:  Microbiology results:  10/18 nasal Cx: sent  Thank you for allowing pharmacy to be a part of this patient's care.  Darrow Bussing, PharmD Pharmacy Resident 02/03/2016 7:57 AM

## 2016-02-03 NOTE — Progress Notes (Addendum)
[place] [facility] [employer] NAME: Carol Mooney    MR#:  [id number]  DATE OF BIRTH:  1976/03/01  SUBJECTIVE:  Came in with left facial swelling. Had a pimple/furuncle bursted  Was on doxycyline for 2 days No fever  REVIEW OF SYSTEMS:   Review of Systems  Constitutional: Negative for chills, fever and weight loss.  HENT: Negative for ear discharge, ear pain and nosebleeds.   Eyes: Negative for blurred vision, pain and discharge.  Respiratory: Negative for sputum production, shortness of breath, wheezing and stridor.   Cardiovascular: Negative for chest pain, palpitations, orthopnea and PND.  Gastrointestinal: Negative for abdominal pain, diarrhea, nausea and vomiting.  Genitourinary: Negative for frequency and urgency.  Musculoskeletal: Negative for back pain and joint pain.  Skin:       Left periorbital swelling. No redness. furuncle scab+  Neurological: Negative for sensory change, speech change, focal weakness and weakness.  Psychiatric/Behavioral: Negative for depression and hallucinations. The patient is not nervous/anxious.    Tolerating Diet:yes Tolerating PT: ambulatory  DRUG ALLERGIES:   Allergies  Allergen Reactions  . Morphine And Related Itching    VITALS:  Blood pressure (!) 103/49, pulse 85, temperature 98.1 F (36.7 C), temperature source Oral, resp. rate 18, height 5\' 4"  (1.626 m), weight 75.4 kg (166 lb 4.8 oz), last menstrual period 10/11/2015, SpO2 99 %.  PHYSICAL EXAMINATION:   Physical Exam  GENERAL:  40 y.o.-year-old patient lying in the bed with no acute distress.  EYES: Pupils equal, round, reactive to light and accommodation. No scleral icterus. Extraocular muscles intact.  HEENT: Head atraumatic, normocephalic. Oropharynx and nasopharynx clear.  NECK:  Supple, no jugular venous distention. No thyroid enlargement, no tenderness.  LUNGS: Normal breath sounds bilaterally, no wheezing, rales, rhonchi. No  use of accessory muscles of respiration.  CARDIOVASCULAR: S1, S2 normal. No murmurs, rubs, or gallops.  ABDOMEN: Soft, nontender, nondistended. Bowel sounds present. No organomegaly or mass.  EXTREMITIES: No cyanosis, clubbing or edema b/l.    NEUROLOGIC: Cranial nerves II through XII are intact. No focal Motor or sensory deficits b/l.   PSYCHIATRIC:  patient is alert and oriented x 3.  SKIN: No obvious rash, lesion, or ulcer.   LABORATORY PANEL:  CBC  Recent Labs Lab 02/03/16 0521  WBC 8.9  HGB 12.6  HCT 36.7  PLT 310    Chemistries   Recent Labs Lab 02/03/16 0521  NA 140  K 3.3*  CL 108  CO2 27  GLUCOSE 97  BUN <5*  CREATININE 0.61  CALCIUM 9.9   Cardiac Enzymes No results for input(s): TROPONINI in the last 168 hours. RADIOLOGY:  Ct Maxillofacial W Contrast  Result Date: 02/02/2016 CLINICAL DATA:  Worsening swelling about the left eye spreading to the cheek. EXAM: CT MAXILLOFACIAL WITH CONTRAST TECHNIQUE: Multidetector CT imaging of the maxillofacial structures was performed with intravenous contrast. Multiplanar CT image reconstructions were also generated. A small metallic BB was placed on the right temple in order to reliably differentiate right from left. CONTRAST:  73mL ISOVUE-300 IOPAMIDOL (ISOVUE-300) INJECTION 61% COMPARISON:  None. FINDINGS: Osseous: No fracture or mandibular dislocation. No destructive process. Orbits: The globes appear symmetric. There is left periorbital and facial soft tissue inflammation and edema consistent with cellulitis. This tracks from the left orbit T left submandibular soft tissues. No postseptal abnormality identified. Sinuses: Negative Soft tissues: As above Limited intracranial: Negative IMPRESSION: Left periorbital and facial soft tissue inflammation consistent with preseptal cellulitis. No drainable  abscess. Electronically Signed   By: Ashley Royalty M.D.   On: 02/02/2016 23:32   ASSESSMENT AND PLAN:   40 y.o. female with a  history of allergies, depression now being admitted with: 1. Preseptal / periorbital cellulitis of the left eye -admit for IV antibiotics  - preliminary culture results of gram-positive cocci in clusters -d/c IV fluids, follow up cultures - pain control, warm compress. -CT face no abscess -On IV clindamycin. Pt is in Healthcare - d/w ID  Ok to cont doxycycline and IV clinda and then deescalate when final WC results obtained  2. Hypokalemia, mild-replace by mouth  3. History of insomnia-continue trazodone  4. History of anxiety/depression-continue Wellbutrin  Patient may continue vitamin C, Bentyl, MiraLAX, senna.  Case discussed with Care Management/Social Worker. Management plans discussed with the patient, family and they are in agreement.  CODE STATUS: FULL  DVT Prophylaxis: lovenox  TOTAL TIME TAKING CARE OF THIS PATIENT: 30 minutes.  >50% time spent on counselling and coordination of care  POSSIBLE D/C IN 1-2 DAYS, DEPENDING ON CLINICAL CONDITION.  Note: This dictation was prepared with Dragon dictation along with smaller phrase technology. Any transcriptional errors that result from this process are unintentional.  Annalese Stiner M.D on 02/03/2016 at 12:45 PM  Between 7am to 6pm - Pager - [phone]  After 6pm go to [email or web address] - password [email or web address]  [facility]  Office  [phone]  CC: Primary care physician; Carmon Ginsberg, PA

## 2016-02-03 NOTE — Consult Note (Signed)
Carol Mooney, [place] [id number] June 11, 1975 Riley Nearing, MD  Reason for Consult: Facial cellulitis Requesting Physician: Fritzi Mandes, MD Consulting Physician: Riley Nearing, MD  HPI: This 40 y.o. year old female was admitted on 02/02/2016 for Abscess of face [L02.01] Facial cellulitis [B44.967] Preseptal cellulitis of left eye [L03.213]. Patient had several days of swelling over the left cheek area which seem to start with a small pimple on the cheek. There is no prior history of recurrent cellulitis or boils. She was initially placed on doxycycline but after seeing no improvement return to the emergency room where incision and drainage of a small facial abscess was performed, and she was switched to clindamycin. She actually returned later feeling like the swelling was worsening and was admitted to the hospital. Initial cultures show gram positive cocci in clusters, and staph aureus has been confirmed although sensitivities have not come back yet. She was initially given vancomycin at the time of admission but apparently has been switched to IV clindamycin and oral doxycycline. She does feel the swelling is gone down but still having pain on the left side of the face and under the eye. No visual change or fever. CT scan shows no evidence of a drainable abscess collection, and no evidence of sinusitis.  Medications:  Current Facility-Administered Medications  Medication Dose Route Frequency Provider Last Rate Last Dose  . acetaminophen (TYLENOL) tablet 650 mg  650 mg Oral Q6H PRN Alexis Hugelmeyer, DO       Or  . acetaminophen (TYLENOL) suppository 650 mg  650 mg Rectal Q6H PRN Alexis Hugelmeyer, DO      . bisacodyl (DULCOLAX) EC tablet 5 mg  5 mg Oral Daily PRN Alexis Hugelmeyer, DO      . buPROPion (WELLBUTRIN SR) 12 hr tablet 150 mg  150 mg Oral BID Alexis Hugelmeyer, DO   150 mg at 02/03/16 0901  . calcium carbonate (TUMS - dosed in mg elemental calcium) chewable tablet 200 mg of elemental calcium   1 tablet Oral BID Fritzi Mandes, MD   200 mg of elemental calcium at 02/03/16 1207  . calcium-vitamin D (OSCAL WITH D) 500-200 MG-UNIT per tablet 1 tablet  1 tablet Oral Daily Alexis Hugelmeyer, DO   1 tablet at 02/03/16 0901  . clindamycin (CLEOCIN) IVPB 600 mg  600 mg Intravenous Q8H Fritzi Mandes, MD   600 mg at 02/03/16 1344  . dicyclomine (BENTYL) capsule 20 mg  20 mg Oral TID PRN Alexis Hugelmeyer, DO      . docusate sodium (COLACE) capsule 100 mg  100 mg Oral Daily Fritzi Mandes, MD   100 mg at 02/03/16 0901  . doxycycline (VIBRA-TABS) tablet 100 mg  100 mg Oral Q12H Fritzi Mandes, MD   100 mg at 02/03/16 1344  . HYDROcodone-acetaminophen (NORCO/VICODIN) 5-325 MG per tablet 1-2 tablet  1-2 tablet Oral Q6H PRN Alexis Hugelmeyer, DO   2 tablet at 02/03/16 1503  . ketorolac (TORADOL) 15 MG/ML injection 15 mg  15 mg Intravenous Q6H PRN Saundra Shelling, MD   15 mg at 02/03/16 1207  . ondansetron (ZOFRAN) tablet 4 mg  4 mg Oral Q6H PRN Alexis Hugelmeyer, DO       Or  . ondansetron (ZOFRAN) injection 4 mg  4 mg Intravenous Q6H PRN Alexis Hugelmeyer, DO   4 mg at 02/03/16 1207  . polyethylene glycol (MIRALAX / GLYCOLAX) packet 17 g  17 g Oral Daily Alexis Hugelmeyer, DO   17 g at 02/03/16 0901  . senna (SENOKOT)  tablet 8.6 mg  1 tablet Oral Daily PRN Alexis Hugelmeyer, DO   8.6 mg at 02/03/16 0901  . traZODone (DESYREL) tablet 50 mg  50 mg Oral QHS PRN Alexis Hugelmeyer, DO   50 mg at 02/03/16 0200  . vitamin C (ASCORBIC ACID) tablet 500 mg  500 mg Oral Daily Alexis Hugelmeyer, DO   500 mg at 02/03/16 0901  .  Medications Prior to Admission  Medication Sig Dispense Refill  . acetaminophen (TYLENOL) 325 MG tablet Take 650 mg by mouth every 6 (six) hours as needed.    Marland Kitchen ascorbic acid (VITAMIN C) 500 MG tablet Take 500 mg by mouth daily.    . Calcium Carb-Cholecalciferol (CALCIUM + D3) 600-200 MG-UNIT TABS Take 1 tablet by mouth daily.    . clindamycin (CLEOCIN) 150 MG capsule Take 1 capsule (150 mg total) by  mouth 4 (four) times daily. 40 capsule 0  . dicyclomine (BENTYL) 20 MG tablet Take 1 tablet (20 mg total) by mouth 3 (three) times daily as needed for spasms. 30 tablet 0  . ibuprofen (ADVIL,MOTRIN) 50 MG chewable tablet Chew 1 tablet (50 mg total) by mouth every 8 (eight) hours as needed for fever. 24 tablet 2  . polyethylene glycol (MIRALAX / GLYCOLAX) packet Take 17 g by mouth daily.    Marland Kitchen senna (SENOKOT) 8.6 MG TABS tablet Take 1 tablet by mouth daily as needed for mild constipation.    . traZODone (DESYREL) 50 MG tablet Take 1 tablet (50 mg total) by mouth at bedtime as needed for sleep (as needed for sleep). 30 tablet 2  . buPROPion (WELLBUTRIN SR) 150 MG 12 hr tablet Take 1 tablet (150 mg total) by mouth 2 (two) times daily. 60 tablet 5  . valACYclovir (VALTREX) 1000 MG tablet Two pills every 12 hours for one day at onset of cold sore (Patient not taking: Reported on 02/03/2016) 4 tablet 5    Allergies:  Allergies  Allergen Reactions  . Morphine And Related Itching    PMH:  Past Medical History:  Diagnosis Date  . Allergy   . Complication of anesthesia    nausea and vomiting  . Depression   . PONV (postoperative nausea and vomiting)     Fam Hx:  Family History  Problem Relation Age of Onset  . Coronary artery disease Father   . Depression Father   . Diabetes Father     insulin dependent  . Depression Brother   . Cancer Maternal Grandmother     breast  . Breast cancer Maternal Grandmother 73  . Depression Sister   . Breast cancer Paternal Grandmother 36    Soc Hx:  Social History   Social History  . Marital status: Married    Spouse name: N/A  . Number of children: N/A  . Years of education: N/A   Occupational History  . Not on file.   Social History Main Topics  . Smoking status: Former Smoker    Quit date: 05/19/2013  . Smokeless tobacco: Never Used  . Alcohol use 1.2 oz/week    2 Shots of liquor per week     Comment: very rarely  . Drug use: No  .  Sexual activity: Yes    Birth control/ protection: Other-see comments     Comment: hysterectomy   Other Topics Concern  . Not on file   Social History Narrative  . No narrative on file    PSH:  Past Surgical History:  Procedure Laterality Date  .  c sections    . HYSTERECTOMY ABDOMINAL WITH SALPINGECTOMY Bilateral 12/07/2015   Procedure: HYSTERECTOMY ABDOMINAL WITH SALPINGECTOMY/EXCISION OF ENDOMETRIOS;  Surgeon: Boykin Nearing, MD;  Location: [facility];  Service: Gynecology;  Laterality: Bilateral;  . OOPHORECTOMY Bilateral 12/07/2015   Procedure: OOPHORECTOMY;  Surgeon: Boykin Nearing, MD;  Location: [facility];  Service: Gynecology;  Laterality: Bilateral;  . TUBAL LIGATION  2000  . UTERINE STENT PLACEMENT Bilateral 12/07/2015   Procedure: UTERINE STENT PLACEMENT/REMOVAL;  Surgeon: Boykin Nearing, MD;  Location: [facility];  Service: Gynecology;  Laterality: Bilateral;  . Procedures since admission: No admission procedures for hospital encounter.  ROS: Review of systems normal other than 12 systems except per HPI.  PHYSICAL EXAM  Vitals: Blood pressure (!) 104/56, pulse 77, temperature 97.7 F (36.5 C), temperature source Oral, resp. rate 18, height 5' 4"  (1.626 m), weight 75.4 kg (166 lb 4.8 oz), last menstrual period 10/11/2015, SpO2 100 %.. General: Well-developed, Well-nourished in no acute distress Mood: Mood and affect well adjusted, pleasant and cooperative. Orientation: Grossly alert and oriented. Vocal Quality: No hoarseness. Communicates verbally. head and Face: NCAT. There is a small incision over the left zygomatic area where she had the incision and drainage performed. There is maybe a 3 cm area of induration extending posteriorly from that without fluctuance. Predominantly serous though slightly cloudy material can be expressed but again no large abscess collection is appreciated in this area is freely draining. She has edema of the lower eyelid which is  soft but slightly tender with mild redness over these areas. Clearly the area around the incision and drainage site is much more tender and still inflamed.  Ears: External ears with normal landmarks, no lesions. External auditory canals free of infection, cerumen impaction or lesions. Tympanic membranes intact with good landmarks and normal mobility on pneumatic otoscopy. No middle ear effusion. Hearing: Speech reception grossly normal. Nose: External nose normal with midline dorsum and no lesions or deformity. Nasal Cavity reveals essentially midline septum with normal inferior turbinates. No significant mucosal congestion or erythema. Nasal secretions are minimal and clear. No polyps seen on anterior rhinoscopy. Oral Cavity/ Oropharynx: Lips are normal with no lesions. Teeth no frank dental caries. Gingiva healthy with no lesions or gingivitis. Oropharynx including tongue, buccal mucosa, floor of mouth, hard and soft palate, uvula and posterior pharynx free of exudates, erythema or lesions with normal symmetry and hydration.  Indirect Laryngoscopy/Nasopharyngoscopy: Visualization of the larynx, hypopharynx and nasopharynx is not possible in this setting with routine examination. Neck: Supple and symmetric with no palpable masses, tenderness or crepitance. The trachea is midline. Thyroid gland is soft, nontender and symmetric with no masses or enlargement. Parotid and submandibular glands are soft, nontender and symmetric, without masses. Lymphatic: Cervical lymph nodes reveal a small subcentimeter left node just below the earlobe, and some tenderness in the region of the jugulodigastric nodes although no bulky adenopathy is appreciated.Marland Kitchen Respiratory: Normal respiratory effort without labored breathing. Cardiovascular: Carotid pulse shows regular rate and rhythm Neurologic: Cranial Nerves II through XII are grossly intact. Eyes: Gaze and Ocular Motility are grossly normal. PERRLA. No visible  nystagmus.  MEDICAL DECISION MAKING: Data Review:  Results for orders placed or performed during the hospital encounter of 02/02/16 (from the past 48 hour(s))  Basic metabolic panel     Status: Abnormal   Collection Time: 02/02/16  9:33 PM  Result Value Ref Range   Sodium 139 135 - 145 mmol/L   Potassium 3.4 (L) 3.5 - 5.1  mmol/L   Chloride 104 101 - 111 mmol/L   CO2 29 22 - 32 mmol/L   Glucose, Bld 93 65 - 99 mg/dL   BUN 6 6 - 20 mg/dL   Creatinine, Ser 0.71 0.44 - 1.00 mg/dL   Calcium 10.0 8.9 - 10.3 mg/dL   GFR calc non Af Amer >60 >60 mL/min   GFR calc Af Amer >60 >60 mL/min    Comment: (NOTE) The eGFR has been calculated using the CKD EPI equation. This calculation has not been validated in all clinical situations. eGFR's persistently <60 mL/min signify possible Chronic Kidney Disease.    Anion gap 6 5 - 15  CBC with Differential     Status: Abnormal   Collection Time: 02/02/16  9:33 PM  Result Value Ref Range   WBC 10.2 3.6 - 11.0 K/uL   RBC 3.93 3.80 - 5.20 MIL/uL   Hemoglobin 12.6 12.0 - 16.0 g/dL   HCT 37.5 35.0 - 47.0 %   MCV 95.4 80.0 - 100.0 fL   MCH 32.0 26.0 - 34.0 pg   MCHC 33.6 32.0 - 36.0 g/dL   RDW 13.1 11.5 - 14.5 %   Platelets 319 150 - 440 K/uL   Neutrophils Relative % 78 %   Neutro Abs 8.0 (H) 1.4 - 6.5 K/uL   Lymphocytes Relative 14 %   Lymphs Abs 1.5 1.0 - 3.6 K/uL   Monocytes Relative 6 %   Monocytes Absolute 0.6 0.2 - 0.9 K/uL   Eosinophils Relative 1 %   Eosinophils Absolute 0.1 0 - 0.7 K/uL   Basophils Relative 1 %   Basophils Absolute 0.0 0 - 0.1 K/uL  Basic metabolic panel     Status: Abnormal   Collection Time: 02/03/16  5:21 AM  Result Value Ref Range   Sodium 140 135 - 145 mmol/L   Potassium 3.3 (L) 3.5 - 5.1 mmol/L   Chloride 108 101 - 111 mmol/L   CO2 27 22 - 32 mmol/L   Glucose, Bld 97 65 - 99 mg/dL   BUN <5 (L) 6 - 20 mg/dL   Creatinine, Ser 0.61 0.44 - 1.00 mg/dL   Calcium 9.9 8.9 - 10.3 mg/dL   GFR calc non Af Amer >60  >60 mL/min   GFR calc Af Amer >60 >60 mL/min    Comment: (NOTE) The eGFR has been calculated using the CKD EPI equation. This calculation has not been validated in all clinical situations. eGFR's persistently <60 mL/min signify possible Chronic Kidney Disease.    Anion gap 5 5 - 15  CBC     Status: Abnormal   Collection Time: 02/03/16  5:21 AM  Result Value Ref Range   WBC 8.9 3.6 - 11.0 K/uL   RBC 3.76 (L) 3.80 - 5.20 MIL/uL   Hemoglobin 12.6 12.0 - 16.0 g/dL   HCT 36.7 35.0 - 47.0 %   MCV 97.5 80.0 - 100.0 fL   MCH 33.4 26.0 - 34.0 pg   MCHC 34.2 32.0 - 36.0 g/dL   RDW 13.3 11.5 - 14.5 %   Platelets 310 150 - 440 K/uL  . Ct Maxillofacial W Contrast  Result Date: 02/02/2016 CLINICAL DATA:  Worsening swelling about the left eye spreading to the cheek. EXAM: CT MAXILLOFACIAL WITH CONTRAST TECHNIQUE: Multidetector CT imaging of the maxillofacial structures was performed with intravenous contrast. Multiplanar CT image reconstructions were also generated. A small metallic BB was placed on the right temple in order to reliably differentiate right from left. CONTRAST:  29m ISOVUE-300 IOPAMIDOL (ISOVUE-300) INJECTION 61% COMPARISON:  None. FINDINGS: Osseous: No fracture or mandibular dislocation. No destructive process. Orbits: The globes appear symmetric. There is left periorbital and facial soft tissue inflammation and edema consistent with cellulitis. This tracks from the left orbit T left submandibular soft tissues. No postseptal abnormality identified. Sinuses: Negative Soft tissues: As above Limited intracranial: Negative IMPRESSION: Left periorbital and facial soft tissue inflammation consistent with preseptal cellulitis. No drainable abscess. Electronically Signed   By: DAshley RoyaltyM.D.   On: 02/02/2016 23:32  .   ASSESSMENT: Left facial cellulitis. There is some edema of the left lower lid but this is not classic "preseptal cellulitis" typically attributed to sinus disease spreading  around the eye. Clearly this came from an area of folliculitis on the left cheek. This area has been adequately drained, and continues to drain slightly cloudy serous fluid. Until the cultures and sensitivities are confirmed, appropriate antibiotic management is somewhat uncertain. Doxycycline typically will cover most methicillin-resistant staph organisms, though clearly this patient needed IV antibiotics with her lack of improvement on doxycycline. Clindamycin may be adequate although there can be some inducible resistance to clindamycin, so vancomycin would still be a consideration. This will become more clear once the culture and sensitivity is completed.  PLAN: Recommend continuing IV antibiotics. She is responding but it's difficult to say whether it was the vancomycin that produce the strong response and whether clindamycin is the best choice at this point. Hopefully the culture and sensitivities will come back within the next few hours to help confirm the best course of management. If she continues to improve she should be able to discharge on oral antibiotics based on sensitivity results. I would also continue warm compresses and clean the area around the incision and drainage site with peroxide 3 times daily and apply gentamicin ointment. There is no indication for further surgical management however.   BRiley Nearing MD 02/03/2016 5:12 PM

## 2016-02-03 NOTE — H&P (Addendum)
[place] @ [facility] Admission History and Physical Harvie Bridge, D.O.  ---------------------------------------------------------------------------------------------------------------------   PATIENT NAME: Carol Mooney MR#: [id number] DATE OF BIRTH: 31-Aug-1975 DATE OF ADMISSION: 02/02/2016 PRIMARY CARE PHYSICIAN: Carmon Ginsberg, PA  REQUESTING/REFERRING PHYSICIAN: ED Dr. Owens Shark  CHIEF COMPLAINT: Chief Complaint  Patient presents with  . Facial Swelling  . Abscess    HISTORY OF PRESENT ILLNESS: Carol Mooney is a 40 y.o. female with a known history of Allergies, depression presents to the emergency department complaining of left eye swelling.  Patient was in a usual state of health until 6 days ago when she developed left-sided pimple which became irritated. She went to an urgent care 2 days ago and was placed on doxycycline. No cultures were taken at that time. Today she presented to the emergency department with worsening pain at the left face, behind the eye. The wound was opened and cultured. She was given IV Clinda and a prescription for by mouth Clinda and was discharged home. She returned to the emergency department when she developed severe left eye swelling, worsening pain and drainage. Patient denies any fevers, chills or vision changes. Her only complaint at this point is headache and left-sided facial pain.  Otherwise there has been no change in status. Patient has been taking medication as prescribed and there has been no recent change in medication or diet.  There has been no recent illness, travel or sick contacts.    Patient denies fevers/chills, weakness, dizziness, chest pain, shortness of breath, N/V/C/D, abdominal pain, dysuria/frequency, changes in mental status.   EMS/ED COURSE:   Patient received vancomycin.  PAST MEDICAL HISTORY: Past Medical History:  Diagnosis Date  . Allergy   . Complication of anesthesia    nausea and vomiting  .  Depression   . PONV (postoperative nausea and vomiting)       PAST SURGICAL HISTORY: Past Surgical History:  Procedure Laterality Date  . c sections    . HYSTERECTOMY ABDOMINAL WITH SALPINGECTOMY Bilateral 12/07/2015   Procedure: HYSTERECTOMY ABDOMINAL WITH SALPINGECTOMY/EXCISION OF ENDOMETRIOS;  Surgeon: Boykin Nearing, MD;  Location: [facility] ORS;  Service: Gynecology;  Laterality: Bilateral;  . OOPHORECTOMY Bilateral 12/07/2015   Procedure: OOPHORECTOMY;  Surgeon: Boykin Nearing, MD;  Location: [facility] ORS;  Service: Gynecology;  Laterality: Bilateral;  . TUBAL LIGATION  2000  . UTERINE STENT PLACEMENT Bilateral 12/07/2015   Procedure: UTERINE STENT PLACEMENT/REMOVAL;  Surgeon: Boykin Nearing, MD;  Location: [facility] ORS;  Service: Gynecology;  Laterality: Bilateral;      SOCIAL HISTORY: Social History  Substance Use Topics  . Smoking status: Former Smoker    Quit date: 05/19/2013  . Smokeless tobacco: Never Used  . Alcohol use 1.2 oz/week    2 Shots of liquor per week     Comment: very rarely      FAMILY HISTORY: Family History  Problem Relation Age of Onset  . Coronary artery disease Father   . Depression Father   . Diabetes Father     insulin dependent  . Depression Brother   . Cancer Maternal Grandmother     breast  . Breast cancer Maternal Grandmother 90  . Depression Sister   . Breast cancer Paternal Grandmother 88     MEDICATIONS AT HOME: Prior to Admission medications   Medication Sig Start Date End Date Taking? Authorizing Provider  acetaminophen (TYLENOL) 325 MG tablet Take 650 mg by mouth every 6 (six) hours as needed.   Yes Historical Provider, MD  ascorbic  acid (VITAMIN C) 500 MG tablet Take 500 mg by mouth daily.   Yes Historical Provider, MD  Calcium Carb-Cholecalciferol (CALCIUM + D3) 600-200 MG-UNIT TABS Take 1 tablet by mouth daily.   Yes Historical Provider, MD  clindamycin (CLEOCIN) 150 MG capsule Take 1 capsule (150 mg total) by mouth  4 (four) times daily. 02/02/16  Yes Sable Feil, PA-C  dicyclomine (BENTYL) 20 MG tablet Take 1 tablet (20 mg total) by mouth 3 (three) times daily as needed for spasms. 01/03/16  Yes Daymon Larsen, MD  ibuprofen (ADVIL,MOTRIN) 50 MG chewable tablet Chew 1 tablet (50 mg total) by mouth every 8 (eight) hours as needed for fever. 02/02/16 02/01/17 Yes Sable Feil, PA-C  polyethylene glycol [facility] / GLYCOLAX) packet Take 17 g by mouth daily.   Yes Historical Provider, MD  senna (SENOKOT) 8.6 MG TABS tablet Take 1 tablet by mouth daily as needed for mild constipation.   Yes Historical Provider, MD  traZODone (DESYREL) 50 MG tablet Take 1 tablet (50 mg total) by mouth at bedtime as needed for sleep (as needed for sleep). 11/05/15  Yes Carmon Ginsberg, PA  buPROPion (WELLBUTRIN SR) 150 MG 12 hr tablet Take 1 tablet (150 mg total) by mouth 2 (two) times daily. 11/05/15   Carmon Ginsberg, PA  valACYclovir (VALTREX) 1000 MG tablet Two pills every 12 hours for one day at onset of cold sore Patient not taking: Reported on 02/03/2016 11/05/15   Carmon Ginsberg, PA      DRUG ALLERGIES: Allergies  Allergen Reactions  . Morphine And Related Itching     REVIEW OF SYSTEMS: CONSTITUTIONAL: No fatigue, weakness, fever, chills, weight gain/loss, headache EYES: No blurry or double vision.Left eye pain. Other ENT: No tinnitus, postnasal drip, redness or soreness of the oropharynx. RESPIRATORY: No dyspnea, cough, wheeze, hemoptysis. CARDIOVASCULAR: No chest pain, orthopnea, palpitations, syncope. GASTROINTESTINAL: No nausea, vomiting, constipation, diarrhea, abdominal pain. No hematemesis, melena or hematochezia. GENITOURINARY: No dysuria, frequency, hematuria. ENDOCRINE: No polyuria or nocturia. No heat or cold intolerance. HEMATOLOGY: No anemia, bruising, bleeding. INTEGUMENTARY: No rashes, ulcers, lesions. MUSCULOSKELETAL: No pain, arthritis, swelling, gout. NEUROLOGIC: No numbness, tingling, weakness  or ataxia. No seizure-type activity. PSYCHIATRIC: No anxiety, depression, insomnia.  PHYSICAL EXAMINATION: VITAL SIGNS: Blood pressure 116/73, pulse 82, temperature 98.4 F (36.9 C), temperature source Oral, resp. rate 18, height 5\' 6"  (1.676 m), weight 72.6 kg (160 lb), last menstrual period 10/11/2015, SpO2 100 %.  GENERAL: 40 y.o.-year-old white female patient, well-developed, well-nourished lying in the bed in no acute distress.  Pleasant and cooperative.   HEENT: Head atraumatic, normocephalic. Pupils equal, round, reactive to light and accommodation. No scleral icterus. Extraocular muscles intact. There is a raised pustule with surrounding edema at the left zygomatic region just inferior to the temple. The edema extends to the preseptal region under the left eye with mild erythema. Oropharynx is clear. Mucus membranes moist. NECK: Supple, full range of motion. No JVD, no bruit heard. No cervical lymphadenopathy. CHEST: Normal breath sounds bilaterally. No wheezing, rales, rhonchi or crackles. No use of accessory muscles of respiration.  No reproducible chest wall tenderness.  CARDIOVASCULAR: S1, S2 normal. No murmurs, rubs, or gallops appreciated. Cap refill <2 seconds. ABDOMEN: Soft, nontender, nondistended. No rebound, guarding, rigidity. Normoactive bowel sounds present in all four quadrants. No organomegaly or mass. EXTREMITIES: Full range of motion. No pedal edema, cyanosis, or clubbing. NEUROLOGIC: Cranial nerves II through XII are grossly intact with no focal sensorimotor deficit. Muscle strength 5/5 in  all extremities. Sensation intact. Gait not checked. PSYCHIATRIC: The patient is alert and oriented x 3. Normal affect, mood, thought content. SKIN: Warm, dry, and intact without obvious rash, lesion, or ulcer.  LABORATORY PANEL:  CBC  Recent Labs Lab 02/02/16 2133  WBC 10.2  HGB 12.6  HCT 37.5  PLT 319    ----------------------------------------------------------------------------------------------------------------- Chemistries  Recent Labs Lab 02/02/16 2133  NA 139  K 3.4*  CL 104  CO2 29  GLUCOSE 93  BUN 6  CREATININE 0.71  CALCIUM 10.0   ------------------------------------------------------------------------------------------------------------------ Cardiac Enzymes No results for input(s): TROPONINI in the last 168 hours. ------------------------------------------------------------------------------------------------------------------  RADIOLOGY: Ct Maxillofacial W Contrast  Result Date: 02/02/2016 CLINICAL DATA:  Worsening swelling about the left eye spreading to the cheek. EXAM: CT MAXILLOFACIAL WITH CONTRAST TECHNIQUE: Multidetector CT imaging of the maxillofacial structures was performed with intravenous contrast. Multiplanar CT image reconstructions were also generated. A small metallic BB was placed on the right temple in order to reliably differentiate right from left. CONTRAST:  74mL ISOVUE-300 IOPAMIDOL (ISOVUE-300) INJECTION 61% COMPARISON:  None. FINDINGS: Osseous: No fracture or mandibular dislocation. No destructive process. Orbits: The globes appear symmetric. There is left periorbital and facial soft tissue inflammation and edema consistent with cellulitis. This tracks from the left orbit T left submandibular soft tissues. No postseptal abnormality identified. Sinuses: Negative Soft tissues: As above Limited intracranial: Negative IMPRESSION: Left periorbital and facial soft tissue inflammation consistent with preseptal cellulitis. No drainable abscess. Electronically Signed   By: Ashley Royalty M.D.   On: 02/02/2016 23:32    IMPRESSION AND PLAN:  This is a 40 y.o. female with a history of allergies, depression now being admitted with: 1. Preseptal / periorbital cellulitis of the left eye-admit for IV antibiotics with vancomycin given preliminary culture results of  gram-positive cocci in clusters, IV fluids, follow up cultures, ENT consult, pain control, warm compress. 2. Hypokalemia, mild-replace by mouth 3. History of insomnia-continue trazodone 4. History of anxiety/depression-continue Wellbutrin Patient may continue vitamin C, Bentyl, MiraLAX, senna.   Diet/Nutrition: Regular Fluids: IV normal saline DVT Px: SCDs and early ambulation Code Status: Full  All the records are reviewed and case discussed with ED provider. Management plans discussed with the patient and/or family who express understanding and agree with plan of care.   TOTAL TIME TAKING CARE OF THIS PATIENT: 60 minutes.   Dayson Aboud D.O. on 02/03/2016 at 1:03 AM Between 7am to 6pm - Pager - [phone] After 6pm go to [email or web address] - Proofreader [facility] [place] [facility] Office [phone] CC: Primary care physician; Carmon Ginsberg, PA     Note: This dictation was prepared with Dragon dictation along with smaller phrase technology. Any transcriptional errors that result from this process are unintentional.

## 2016-02-03 NOTE — Progress Notes (Signed)
Initial Nutrition Assessment  DOCUMENTATION CODES:   Severe malnutrition in context of acute illness/injury  INTERVENTION:  Encouraged adequate intake of protein and calories at meals.  Encouraged patient to add snacks between meals.   NUTRITION DIAGNOSIS:   Inadequate oral intake related to poor appetite, other (see comment) (early satiety) as evidenced by per patient/family report.  GOAL:   Patient will meet greater than or equal to 90% of their needs  MONITOR:   PO intake, Labs, Weight trends, Skin, I & O's  REASON FOR ASSESSMENT:   Malnutrition Screening Tool    ASSESSMENT:   40 year old female with a history of allergies, depression, admitted with preseptal/periorbital cellulitis of the left eye.   Patient reports her appetite has been poor for a few months now. She endorses early satiety and occasional nausea. She reports eating 50% of usual intake, sometimes only have 1 meal per day (salad with chicken and fruit) of which she cannot finish. She reports weight loss that started in July. Reported UBW is 190 lbs. Per chart, patient lost 14 lbs (8% body weight) over 3 months, which is significant for time frame. She has since started to regain some weight back.   Patient reports she does not want to drink any supplement drinks. She is amenable to adding more protein and calories to meals and possibly adding in snacks. She reports she does not want to regain back to her UBW, just wants to stop the weight loss.  Medications reviewed and include: calcium carbonate 1 tablet BID, Oscal with D 500-200 mg daily, Colace, Miralax, Vitamin C 500 mg daily, zofran prn.  Labs reviewed: Potassium 3.3  Nutrition-Focused physical exam completed. Findings are no fat depletion, no muscle depletion, and no edema.   Patient meets criteria for severe acute malnutrition in setting of 8% weight loss in 3 months, intake </=50% for >/= 5 days.  Diet Order:  Diet regular Room service  appropriate? Yes; Fluid consistency: Thin  Skin:  Wound (see comment) (Cellulitis left face)  Last BM:  Unknown  Height:   Ht Readings from Last 1 Encounters:  02/03/16 5\' 4"  (1.626 m)    Weight:   Wt Readings from Last 1 Encounters:  02/03/16 166 lb 4.8 oz (75.4 kg)    Ideal Body Weight:  54.54 kg  BMI:  Body mass index is 28.55 kg/m.  Estimated Nutritional Needs:   Kcal:  1690-1830 (MSJ x 1.2-1.3)  Protein:  75-90 grams (1-1.2 grams/kg)  Fluid:  1.7-1.8 L/day  EDUCATION NEEDS:   Education needs addressed  Willey Blade, MS, RD, LDN Pager: [phone] After Hours Pager: [phone]

## 2016-02-04 MED ORDER — GENTAMICIN SULFATE 0.1 % EX OINT: TOPICAL_OINTMENT | Freq: Three times a day (TID) | CUTANEOUS | 0 refills | Status: DC

## 2016-02-04 MED ORDER — DOXYCYCLINE HYCLATE 100 MG PO CAPS
100.0000 mg | ORAL_CAPSULE | Freq: Two times a day (BID) | ORAL | 0 refills | Status: DC
Start: 2016-02-04 — End: 2016-06-17

## 2016-02-04 NOTE — Care Management (Signed)
Patient does not appear to need IV antibiotics at this discharge and will transition to PO. Please call RNCM if this changes [phone]. Case closed.

## 2016-02-04 NOTE — Progress Notes (Signed)
Pt being discharged today, discharge instructions given to patient. She verified understanding. Instructions given on where to pick up prescriptions. 0 paper prescriptions given to patient. IV x1 removed. She was wheeled out in wheelchair by staff.

## 2016-02-04 NOTE — Discharge Summary (Signed)
Carol Mooney, is a 40 y.o. female  DOB February 19, 1976  MRN [id number].  Admission date:  02/02/2016  Admitting Physician  Harvie Bridge, DO  Discharge Date:  02/04/2016   Primary MD  Carmon Ginsberg, PA  Recommendations for primary care physician for things to follow:  Follow-up with primary doctor in one week Follow up with ENT Dr. Clyde Canterbury in one week   Admission Diagnosis  Abscess of face [L02.01] Facial cellulitis [id number] Preseptal cellulitis of left eye [L03.213]   Discharge Diagnosis  Abscess of face [L02.01] Facial cellulitis [id number] Preseptal cellulitis of left eye [L03.213]    Active Problems:   Preseptal cellulitis   Protein-calorie malnutrition, severe      Past Medical History:  Diagnosis Date  . Allergy   . Complication of anesthesia    nausea and vomiting  . Depression   . PONV (postoperative nausea and vomiting)     Past Surgical History:  Procedure Laterality Date  . c sections    . HYSTERECTOMY ABDOMINAL WITH SALPINGECTOMY Bilateral 12/07/2015   Procedure: HYSTERECTOMY ABDOMINAL WITH SALPINGECTOMY/EXCISION OF ENDOMETRIOS;  Surgeon: Boykin Nearing, MD;  Location: [facility] ORS;  Service: Gynecology;  Laterality: Bilateral;  . OOPHORECTOMY Bilateral 12/07/2015   Procedure: OOPHORECTOMY;  Surgeon: Boykin Nearing, MD;  Location: [facility] ORS;  Service: Gynecology;  Laterality: Bilateral;  . TUBAL LIGATION  2000  . UTERINE STENT PLACEMENT Bilateral 12/07/2015   Procedure: UTERINE STENT PLACEMENT/REMOVAL;  Surgeon: Boykin Nearing, MD;  Location: [facility] ORS;  Service: Gynecology;  Laterality: Bilateral;       History of present illness and  Hospital Course:     Kindly see H&P for history of present illness and admission details, please review complete Labs, Consult reports and  Test reports for all details in brief  HPI  from the history and physical done on the day of admission  Admitted on October 18 secondary to facial swelling, left eye swelling.  Hospital Course  40 year old female patient initially had swelling on the left side of face, went to urgent care 2 days before the admission, doxycycline was given that time. Patient came to emergency room on the 18th because of worsening of swelling on the left side of the face more behind the left eye. Patient had incision and drainage of the left facial abcess she received a prescription for by mouth clindamycin, discharge home from emergency room. Patient came to emergency room again on 18th because of worsening pain on the left face area, severe left eye pain with swelling of the left eye which has worsened.  Pt  is admitted to hospitalist service, started on IV vancomycin, clindamycin, seen by ENT, Dr. Richardson Landry recommended warm compressions, cleaning the area 3 times a day with hydrogen peroxide, applying gentamicin ointment, continuing IV antibiotics. Today I called the microbiology, patient abscess cultures from left side of face  incision and drainage that was in the emergency room showed MRSA. I am discharging the patient with doxycycline 100 mg by mouth twice a day for 14 days, advised her to continue warm compressions 3 times a day, cleaning the area with hydrogen peroxide, applied gentamicin ointment. Vicodin as needed for the pain, use Zofran for nausea.      Discharge Condition: stable   Follow UP  Follow-up Information    Carmon Ginsberg, [place]. Go on 02/11/2016.   Specialty:  Family Medicine Why:  @ 10:00 am Contact information: [address] [place] [address] [phone]  Riley Nearing, MD. Go on 02/12/2016.   Specialty:  Otolaryngology Why:  @ 9:45 am Contact information: [address] [place] [address] [phone]              Discharge Instructions  and  Discharge Medications        Medication List    STOP taking these medications   clindamycin 150 MG capsule Commonly known as:  CLEOCIN     TAKE these medications   acetaminophen 325 MG tablet Commonly known as:  TYLENOL Take 650 mg by mouth every 6 (six) hours as needed.   ascorbic acid 500 MG tablet Commonly known as:  VITAMIN C Take 500 mg by mouth daily.   buPROPion 150 MG 12 hr tablet Commonly known as:  WELLBUTRIN SR Take 1 tablet (150 mg total) by mouth 2 (two) times daily.   Calcium + D3 600-200 MG-UNIT Tabs Take 1 tablet by mouth daily.   dicyclomine 20 MG tablet Commonly known as:  BENTYL Take 1 tablet (20 mg total) by mouth 3 (three) times daily as needed for spasms.   doxycycline 100 MG capsule Commonly known as:  VIBRAMYCIN Take 1 capsule (100 mg total) by mouth 2 (two) times daily.   gentamicin ointment 0.1 % Commonly known as:  GARAMYCIN Apply topically 3 (three) times daily.   ibuprofen 50 MG chewable tablet Commonly known as:  ADVIL,MOTRIN Chew 1 tablet (50 mg total) by mouth every 8 (eight) hours as needed for fever.   polyethylene glycol packet Commonly known as:  MIRALAX / GLYCOLAX Take 17 g by mouth daily.   senna 8.6 MG Tabs tablet Commonly known as:  SENOKOT Take 1 tablet by mouth daily as needed for mild constipation.   traZODone 50 MG tablet Commonly known as:  DESYREL Take 1 tablet (50 mg total) by mouth at bedtime as needed for sleep (as needed for sleep).   valACYclovir 1000 MG tablet Commonly known as:  VALTREX Two pills every 12 hours for one day at onset of cold sore         Diet and Activity recommendation: See Discharge Instructions above   Consults obtained -ENT   Major procedures and Radiology Reports - PLEASE review detailed and final reports for all details, in brief -     Ct Maxillofacial W Contrast  Result Date: 02/02/2016 CLINICAL DATA:  Worsening swelling about the  left eye spreading to the cheek. EXAM: CT MAXILLOFACIAL WITH CONTRAST TECHNIQUE: Multidetector CT imaging of the maxillofacial structures was performed with intravenous contrast. Multiplanar CT image reconstructions were also generated. A small metallic BB was placed on the right temple in order to reliably differentiate right from left. CONTRAST:  36mL ISOVUE-300 IOPAMIDOL (ISOVUE-300) INJECTION 61% COMPARISON:  None. FINDINGS: Osseous: No fracture or mandibular dislocation. No destructive process. Orbits: The globes appear symmetric. There is left periorbital and facial soft tissue inflammation and edema consistent with cellulitis. This tracks from the left orbit T left submandibular soft tissues. No postseptal abnormality identified. Sinuses: Negative Soft tissues: As above Limited intracranial: Negative IMPRESSION: Left periorbital and facial soft tissue inflammation consistent with preseptal cellulitis. No drainable abscess. Electronically Signed   By: Ashley Royalty M.D.   On: 02/02/2016 23:32    Micro Results    Recent Results (from the past 240 hour(s))  Wound or Superficial Culture     Status: None (Preliminary result)   Collection Time: 02/02/16 10:30 AM  Result Value Ref Range Status   Specimen  Description ABSCESS  Final   Special Requests Normal  Final   Gram Stain   Final    FEW WBC PRESENT, PREDOMINANTLY PMN RARE GRAM POSITIVE COCCI IN CLUSTERS Performed at [facility]  Final   Report Status PENDING  Incomplete   Organism ID, Bacteria METHICILLIN RESISTANT STAPHYLOCOCCUS AUREUS  Final      Susceptibility   Methicillin resistant staphylococcus aureus - MIC*    CIPROFLOXACIN >=8 RESISTANT Resistant     ERYTHROMYCIN >=8 RESISTANT Resistant     GENTAMICIN <=0.5 SENSITIVE Sensitive     OXACILLIN >=4 RESISTANT Resistant     TETRACYCLINE <=1 SENSITIVE Sensitive     VANCOMYCIN <=0.5 SENSITIVE Sensitive     TRIMETH/SULFA  <=10 SENSITIVE Sensitive     CLINDAMYCIN <=0.25 SENSITIVE Sensitive     RIFAMPIN <=0.5 SENSITIVE Sensitive     Inducible Clindamycin NEGATIVE Sensitive     * FEW METHICILLIN RESISTANT STAPHYLOCOCCUS AUREUS       Today   Subjective:   Carol Mooney today Is that left eye swelling decreased now, decreased left eye pain, decreased left facial pain.  Objective:   Blood pressure 117/75, pulse 75, temperature 98.4 F (36.9 C), temperature source Oral, resp. rate 18, height 5\' 4"  (1.626 m), weight 75.4 kg (166 lb 4.8 oz), last menstrual period 10/11/2015, SpO2 100 %.   Intake/Output Summary (Last 24 hours) at 02/04/16 1005 Last data filed at 02/04/16 0400  Gross per 24 hour  Intake              200 ml  Output              350 ml  Net             -150 ml    Exam Awake Alert, Oriented x 3, No new F.N deficits, Normal affect [place].AT,PERRAL Supple Neck,No JVD, No cervical lymphadenopathy appriciated.  Symmetrical Chest wall movement, Good air movement bilaterally, CTAB RRR,No Gallops,Rubs or new Murmurs, No Parasternal Heave +ve B.Sounds, Abd Soft, Non tender, No organomegaly appriciated, No rebound -guarding or rigidity. No Cyanosis, Clubbing or edema, No new Rash or bruise  Data Review   CBC w Diff: Lab Results  Component Value Date   WBC 8.9 02/03/2016   HGB 12.6 02/03/2016   HCT 36.7 02/03/2016   HCT 39.3 11/11/2014   PLT 310 02/03/2016   PLT 303 11/11/2014   LYMPHOPCT 14 02/02/2016   MONOPCT 6 02/02/2016   EOSPCT 1 02/02/2016   BASOPCT 1 02/02/2016    CMP: Lab Results  Component Value Date   NA 140 02/03/2016   NA 140 11/11/2014   K 3.3 (L) 02/03/2016   CL 108 02/03/2016   CO2 27 02/03/2016   BUN <5 (L) 02/03/2016   BUN 11 11/11/2014   CREATININE 0.61 02/03/2016   PROT 6.9 01/02/2016   PROT 6.5 11/11/2014   ALBUMIN 4.2 01/02/2016   ALBUMIN 4.3 11/11/2014   BILITOT 0.6 01/02/2016   BILITOT 0.2 11/11/2014   ALKPHOS 39 01/02/2016   AST 16 01/02/2016   ALT  17 01/02/2016  .   Total Time in preparing paper work, data evaluation and todays exam - 99 minutes  Carol Mooney M.D on 02/04/2016 at 10:05 AM    Note: This dictation was prepared with Dragon dictation along with smaller phrase technology. Any transcriptional errors that result from this process are unintentional.

## 2016-02-05 LAB — NASAL CULTURE (N/P)
Culture: DETECTED
Special Requests: NORMAL

## 2016-02-06 LAB — AEROBIC CULTURE W GRAM STAIN (SUPERFICIAL SPECIMEN)

## 2016-02-06 LAB — AEROBIC CULTURE  (SUPERFICIAL SPECIMEN): Special Requests: NORMAL

## 2016-02-08 DIAGNOSIS — F411 Generalized anxiety disorder: Secondary | ICD-10-CM | POA: Diagnosis not present

## 2016-02-08 DIAGNOSIS — F330 Major depressive disorder, recurrent, mild: Secondary | ICD-10-CM | POA: Diagnosis not present

## 2016-02-08 DIAGNOSIS — F5105 Insomnia due to other mental disorder: Secondary | ICD-10-CM | POA: Diagnosis not present

## 2016-02-11 ENCOUNTER — Encounter: Payer: Self-pay | Admitting: Family Medicine

## 2016-02-11 ENCOUNTER — Ambulatory Visit (INDEPENDENT_AMBULATORY_CARE_PROVIDER_SITE_OTHER): Payer: 59 | Admitting: Family Medicine

## 2016-02-11 VITALS — BP 92/54 | HR 64 | Temp 97.9°F | Resp 16 | Wt 168.2 lb

## 2016-02-11 DIAGNOSIS — L03213 Periorbital cellulitis: Secondary | ICD-10-CM | POA: Diagnosis not present

## 2016-02-11 DIAGNOSIS — Z8614 Personal history of Methicillin resistant Staphylococcus aureus infection: Secondary | ICD-10-CM | POA: Diagnosis not present

## 2016-02-11 NOTE — Patient Instructions (Signed)
Use Hibiclens soap in the shower for a week to reduce bacterial count. F/u with Dr. Virgia Land as scheduled. Try Prevacid 30 mg. otc and antacids for heartburn.

## 2016-02-11 NOTE — Progress Notes (Signed)
Subjective:     Patient ID: Carol Mooney, female   DOB: 20-Aug-1975, 40 y.o.   MRN: [id number]  HPI  Chief Complaint  Patient presents with  . Hospitalization Follow-up    Patient returns back to office today for hospital follow up, patient was seen and discharged from [facility] on 02/04/16 for facial cellulitis and preseptal celluilitis of left eye. Patient was encouraged to continue Doxycycline and advised to d/c Clindamycin 150mg . Today patient reports that swelling and pain has improved, she reports that she still has discomfort from her left ear radiating to jaw line.   States she feels much better and will be seeing ENT, Dr. Richardson Landry, tomorrow.Has been getting heartburn on the abx. Has been using Bactroban intranasally to to + MRSA nasal cultures.   Review of Systems     Objective:   Physical Exam  Constitutional: She appears well-developed and well-nourished. No distress.  Skin:  Wearing makeup today but no swelling or erythema noted.       Assessment:    1. Preseptal cellulitis: resolved  2. History of MRSA infection    Plan:    Discussed use of Hibiclens soap and otc Prevacid. Will f/u with ENT tomorrow.

## 2016-02-12 DIAGNOSIS — L03211 Cellulitis of face: Secondary | ICD-10-CM | POA: Diagnosis not present

## 2016-02-24 DIAGNOSIS — F411 Generalized anxiety disorder: Secondary | ICD-10-CM | POA: Diagnosis not present

## 2016-02-24 DIAGNOSIS — F5105 Insomnia due to other mental disorder: Secondary | ICD-10-CM | POA: Diagnosis not present

## 2016-02-24 DIAGNOSIS — F330 Major depressive disorder, recurrent, mild: Secondary | ICD-10-CM | POA: Diagnosis not present

## 2016-03-09 DIAGNOSIS — J069 Acute upper respiratory infection, unspecified: Secondary | ICD-10-CM | POA: Diagnosis not present

## 2016-03-17 DIAGNOSIS — F330 Major depressive disorder, recurrent, mild: Secondary | ICD-10-CM | POA: Diagnosis not present

## 2016-03-17 DIAGNOSIS — F5105 Insomnia due to other mental disorder: Secondary | ICD-10-CM | POA: Diagnosis not present

## 2016-03-17 DIAGNOSIS — F411 Generalized anxiety disorder: Secondary | ICD-10-CM | POA: Diagnosis not present

## 2016-03-22 DIAGNOSIS — Z760 Encounter for issue of repeat prescription: Secondary | ICD-10-CM | POA: Diagnosis not present

## 2016-03-22 DIAGNOSIS — H5213 Myopia, bilateral: Secondary | ICD-10-CM | POA: Diagnosis not present

## 2016-04-19 DIAGNOSIS — F5105 Insomnia due to other mental disorder: Secondary | ICD-10-CM | POA: Diagnosis not present

## 2016-04-19 DIAGNOSIS — F330 Major depressive disorder, recurrent, mild: Secondary | ICD-10-CM | POA: Diagnosis not present

## 2016-04-19 DIAGNOSIS — F411 Generalized anxiety disorder: Secondary | ICD-10-CM | POA: Diagnosis not present

## 2016-05-09 DIAGNOSIS — R399 Unspecified symptoms and signs involving the genitourinary system: Secondary | ICD-10-CM | POA: Diagnosis not present

## 2016-05-09 DIAGNOSIS — N390 Urinary tract infection, site not specified: Secondary | ICD-10-CM | POA: Diagnosis not present

## 2016-06-14 DIAGNOSIS — F5105 Insomnia due to other mental disorder: Secondary | ICD-10-CM | POA: Diagnosis not present

## 2016-06-14 DIAGNOSIS — F411 Generalized anxiety disorder: Secondary | ICD-10-CM | POA: Diagnosis not present

## 2016-06-14 DIAGNOSIS — F330 Major depressive disorder, recurrent, mild: Secondary | ICD-10-CM | POA: Diagnosis not present

## 2016-06-17 ENCOUNTER — Encounter: Payer: Self-pay | Admitting: Family Medicine

## 2016-06-17 ENCOUNTER — Ambulatory Visit (INDEPENDENT_AMBULATORY_CARE_PROVIDER_SITE_OTHER): Payer: 59 | Admitting: Family Medicine

## 2016-06-17 VITALS — BP 126/90 | HR 120 | Temp 99.0°F | Resp 17 | Wt 156.6 lb

## 2016-06-17 DIAGNOSIS — R634 Abnormal weight loss: Secondary | ICD-10-CM | POA: Diagnosis not present

## 2016-06-17 DIAGNOSIS — R040 Epistaxis: Secondary | ICD-10-CM | POA: Diagnosis not present

## 2016-06-17 DIAGNOSIS — N939 Abnormal uterine and vaginal bleeding, unspecified: Secondary | ICD-10-CM | POA: Diagnosis not present

## 2016-06-17 NOTE — Progress Notes (Signed)
Subjective:     Patient ID: Carol Mooney, female   DOB: 1975-10-04, 41 y.o.   MRN: [id number]  HPI  Chief Complaint  Patient presents with  . Epistaxis    Patient comes in office today with conerns of frequent nose bleeding from the left nostril since 06/12/16. Patient reports that her nose bleeds up to 2 hrs, she has tried tilting, packing, and pinching nose to stop blood from flowing. Patient describes blood as bright red and states that her nose stopped bleeding 06/15/16. Patient also reports swelling of lymph node on the left side as well, she was unsure if it was related or not.   . Vaginal Bleeding    Patient reports unsual vaginal bleeding for the past two months, patient describes flow as light and states that it only last one day.Patient reports she had a hysterectomy in August, she denies any fever, chills, abdominal pain or pelvic pain and cramping.   Reports fatigue, decreased appetite and weight loss (10#) over the last 6 months. States home is heated with gas and wood. Accompanied by her husband today. She reports she is quite anxious about her sx. 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 Prior hysterectomy was for endometriosis.   Review of Systems     Objective:   Physical Exam  Constitutional: She appears well-developed and well-nourished. No distress.  HENT:  No nasal lesion or bleeding site noted in left nostril  Cardiovascular: Regular rhythm and normal heart sounds.   tachycardic  Pulmonary/Chest: Breath sounds normal.  Abdominal: Soft. There is no tenderness.       Assessment:    1. Vaginal bleeding: if labs normal-gyn exam to r/o dysplastic process. - CBC with Differential/Platelet - INR/PT - PTT  2. Epistaxis: if labs normal may be due to dry heat in her home - CBC with Differential/Platelet - INR/PT - PTT  3. Weight loss - Comprehensive metabolic panel - T4, free -  TSH    Plan:    Discussed use of saline nasal spray for humidification and nasal decongestant spray after an episode. Further f/u pending lab work.

## 2016-06-17 NOTE — Patient Instructions (Signed)
We will you with the lab results. Try saline nasal spray for humidification and nasal decongestant spray after an episode of nasal bleeding.

## 2016-06-18 LAB — CBC WITH DIFFERENTIAL/PLATELET
Basophils Absolute: 0.1 10*3/uL (ref 0.0–0.2)
Basos: 1 %
EOS (ABSOLUTE): 0.1 10*3/uL (ref 0.0–0.4)
Eos: 1 %
Hematocrit: 40.6 % (ref 34.0–46.6)
Hemoglobin: 14.2 g/dL (ref 11.1–15.9)
Immature Grans (Abs): 0 10*3/uL (ref 0.0–0.1)
Immature Granulocytes: 0 %
Lymphocytes Absolute: 2.9 10*3/uL (ref 0.7–3.1)
Lymphs: 21 %
MCH: 32.1 pg (ref 26.6–33.0)
MCHC: 35 g/dL (ref 31.5–35.7)
MCV: 92 fL (ref 79–97)
Monocytes Absolute: 0.8 10*3/uL (ref 0.1–0.9)
Monocytes: 6 %
Neutrophils Absolute: 9.8 10*3/uL — ABNORMAL HIGH (ref 1.4–7.0)
Neutrophils: 71 %
Platelets: 307 10*3/uL (ref 150–379)
RBC: 4.42 x10E6/uL (ref 3.77–5.28)
RDW: 14.7 % (ref 12.3–15.4)
WBC: 13.6 10*3/uL — ABNORMAL HIGH (ref 3.4–10.8)

## 2016-06-18 LAB — COMPREHENSIVE METABOLIC PANEL
ALT: 13 IU/L (ref 0–32)
AST: 23 IU/L (ref 0–40)
Albumin/Globulin Ratio: 1.6 (ref 1.2–2.2)
Albumin: 4.5 g/dL (ref 3.5–5.5)
Alkaline Phosphatase: 55 IU/L (ref 39–117)
BUN/Creatinine Ratio: 7 — ABNORMAL LOW (ref 9–23)
BUN: 5 mg/dL — ABNORMAL LOW (ref 6–24)
Bilirubin Total: <0.2 mg/dL (ref 0.0–1.2)
CO2: 19 mmol/L (ref 18–29)
Calcium: 9.4 mg/dL (ref 8.7–10.2)
Chloride: 101 mmol/L (ref 96–106)
Creatinine, Ser: 0.72 mg/dL (ref 0.57–1.00)
GFR calc Af Amer: 120 mL/min/{1.73_m2} (ref >59)
GFR calc non Af Amer: 104 mL/min/{1.73_m2} (ref >59)
Globulin, Total: 2.9 g/dL (ref 1.5–4.5)
Glucose: 114 mg/dL — ABNORMAL HIGH (ref 65–99)
Potassium: 4.2 mmol/L (ref 3.5–5.2)
Sodium: 143 mmol/L (ref 134–144)
Total Protein: 7.4 g/dL (ref 6.0–8.5)

## 2016-06-18 LAB — PROTIME-INR
INR: 0.9 (ref 0.8–1.2)
Prothrombin Time: 9.4 s (ref 9.1–12.0)

## 2016-06-18 LAB — APTT: aPTT: 26 s (ref 24–33)

## 2016-06-18 LAB — TSH: TSH: 1.06 u[IU]/mL (ref 0.450–4.500)

## 2016-06-18 LAB — T4, FREE: Free T4: 1.31 ng/dL (ref 0.82–1.77)

## 2016-09-06 DIAGNOSIS — F330 Major depressive disorder, recurrent, mild: Secondary | ICD-10-CM | POA: Diagnosis not present

## 2016-09-06 DIAGNOSIS — F411 Generalized anxiety disorder: Secondary | ICD-10-CM | POA: Diagnosis not present

## 2016-09-06 DIAGNOSIS — F5105 Insomnia due to other mental disorder: Secondary | ICD-10-CM | POA: Diagnosis not present

## 2016-10-25 DIAGNOSIS — W57XXXA Bitten or stung by nonvenomous insect and other nonvenomous arthropods, initial encounter: Secondary | ICD-10-CM | POA: Diagnosis not present

## 2016-10-25 DIAGNOSIS — S80862A Insect bite (nonvenomous), left lower leg, initial encounter: Secondary | ICD-10-CM | POA: Diagnosis not present

## 2017-01-11 DIAGNOSIS — F411 Generalized anxiety disorder: Secondary | ICD-10-CM | POA: Diagnosis not present

## 2017-01-11 DIAGNOSIS — F330 Major depressive disorder, recurrent, mild: Secondary | ICD-10-CM | POA: Diagnosis not present

## 2017-01-11 DIAGNOSIS — F5105 Insomnia due to other mental disorder: Secondary | ICD-10-CM | POA: Diagnosis not present

## 2017-01-23 DIAGNOSIS — J340 Abscess, furuncle and carbuncle of nose: Secondary | ICD-10-CM | POA: Diagnosis not present

## 2017-01-23 DIAGNOSIS — L259 Unspecified contact dermatitis, unspecified cause: Secondary | ICD-10-CM | POA: Diagnosis not present

## 2017-02-23 ENCOUNTER — Other Ambulatory Visit: Payer: Self-pay | Admitting: Obstetrics and Gynecology

## 2017-02-23 DIAGNOSIS — Z1231 Encounter for screening mammogram for malignant neoplasm of breast: Secondary | ICD-10-CM

## 2017-02-23 DIAGNOSIS — Z1272 Encounter for screening for malignant neoplasm of vagina: Secondary | ICD-10-CM | POA: Diagnosis not present

## 2017-02-23 DIAGNOSIS — Z01419 Encounter for gynecological examination (general) (routine) without abnormal findings: Secondary | ICD-10-CM | POA: Diagnosis not present

## 2017-02-23 DIAGNOSIS — R61 Generalized hyperhidrosis: Secondary | ICD-10-CM | POA: Diagnosis not present

## 2017-02-23 DIAGNOSIS — N941 Unspecified dyspareunia: Secondary | ICD-10-CM | POA: Diagnosis not present

## 2017-02-23 LAB — HM PAP SMEAR

## 2017-02-24 DIAGNOSIS — Z01419 Encounter for gynecological examination (general) (routine) without abnormal findings: Secondary | ICD-10-CM | POA: Diagnosis not present

## 2017-03-16 ENCOUNTER — Ambulatory Visit
Admission: RE | Admit: 2017-03-16 | Discharge: 2017-03-16 | Disposition: A | Discharge status: Home / Self Care | Payer: 59 | Source: Ambulatory Visit | Attending: Obstetrics and Gynecology | Admitting: Obstetrics and Gynecology

## 2017-03-16 DIAGNOSIS — Z1231 Encounter for screening mammogram for malignant neoplasm of breast: Secondary | ICD-10-CM | POA: Insufficient documentation

## 2017-03-22 ENCOUNTER — Other Ambulatory Visit (HOSPITAL_COMMUNITY): Payer: 59 | Attending: Psychiatry | Admitting: Psychiatry

## 2017-03-22 ENCOUNTER — Encounter (HOSPITAL_COMMUNITY): Payer: Self-pay | Admitting: Psychiatry

## 2017-03-22 DIAGNOSIS — Z87891 Personal history of nicotine dependence: Secondary | ICD-10-CM | POA: Diagnosis not present

## 2017-03-22 DIAGNOSIS — Z79899 Other long term (current) drug therapy: Secondary | ICD-10-CM | POA: Insufficient documentation

## 2017-03-22 DIAGNOSIS — Z885 Allergy status to narcotic agent status: Secondary | ICD-10-CM | POA: Insufficient documentation

## 2017-03-22 DIAGNOSIS — Z818 Family history of other mental and behavioral disorders: Secondary | ICD-10-CM | POA: Diagnosis not present

## 2017-03-22 DIAGNOSIS — Z833 Family history of diabetes mellitus: Secondary | ICD-10-CM | POA: Insufficient documentation

## 2017-03-22 DIAGNOSIS — Z90710 Acquired absence of both cervix and uterus: Secondary | ICD-10-CM | POA: Diagnosis not present

## 2017-03-22 DIAGNOSIS — Z90722 Acquired absence of ovaries, bilateral: Secondary | ICD-10-CM | POA: Diagnosis not present

## 2017-03-22 DIAGNOSIS — F331 Major depressive disorder, recurrent, moderate: Secondary | ICD-10-CM | POA: Insufficient documentation

## 2017-03-22 DIAGNOSIS — Z8249 Family history of ischemic heart disease and other diseases of the circulatory system: Secondary | ICD-10-CM | POA: Insufficient documentation
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
## 2017-03-23 ENCOUNTER — Other Ambulatory Visit (HOSPITAL_COMMUNITY): Payer: 59 | Admitting: Psychiatry

## 2017-03-23 DIAGNOSIS — Z8249 Family history of ischemic heart disease and other diseases of the circulatory system: Secondary | ICD-10-CM | POA: Diagnosis not present

## 2017-03-23 DIAGNOSIS — Z885 Allergy status to narcotic agent status: Secondary | ICD-10-CM | POA: Diagnosis not present

## 2017-03-23 DIAGNOSIS — Z90710 Acquired absence of both cervix and uterus: Secondary | ICD-10-CM | POA: Diagnosis not present

## 2017-03-23 DIAGNOSIS — F331 Major depressive disorder, recurrent, moderate: Secondary | ICD-10-CM | POA: Diagnosis not present

## 2017-03-23 DIAGNOSIS — Z818 Family history of other mental and behavioral disorders: Secondary | ICD-10-CM | POA: Diagnosis not present

## 2017-03-23 DIAGNOSIS — Z79899 Other long term (current) drug therapy: Secondary | ICD-10-CM | POA: Diagnosis not present

## 2017-03-23 DIAGNOSIS — Z90722 Acquired absence of ovaries, bilateral: Secondary | ICD-10-CM | POA: Diagnosis not present

## 2017-03-23 DIAGNOSIS — Z87891 Personal history of nicotine dependence: Secondary | ICD-10-CM | POA: Diagnosis not present

## 2017-03-23 DIAGNOSIS — Z833 Family history of diabetes mellitus: Secondary | ICD-10-CM | POA: Diagnosis not present

## 2017-03-23 NOTE — Progress Notes (Signed)
    Daily Group Progress Note  Program: IOP  Group Time: 9:00-12:00  Participation Level: Active  Behavioral Response: Appropriate  Type of Therapy:  Group Therapy  Summary of Progress: Pt. Presented as calm, engaged in the therapeutic process. Pt. Discussed the challenged of returning to school at 23 to obtain her GED and RN. Pt. Discussed facing her fears of returning to school and that she had done so again last night by enrolling in a BSN program. Pt. Shared with the group that she cried and that she was afraid that she would not be good enough but that she did it anyway and she was very proud of herself. Pt. Participated in breath focused 4-3-8 meditation exercise. Pt. Participated in yoga therapy with Jan Fireman, LPC.      Nancie Neas, LPC

## 2017-03-24 ENCOUNTER — Other Ambulatory Visit (HOSPITAL_COMMUNITY): Payer: 59 | Admitting: Psychiatry

## 2017-03-24 DIAGNOSIS — Z87891 Personal history of nicotine dependence: Secondary | ICD-10-CM | POA: Diagnosis not present

## 2017-03-24 DIAGNOSIS — Z885 Allergy status to narcotic agent status: Secondary | ICD-10-CM | POA: Diagnosis not present

## 2017-03-24 DIAGNOSIS — Z833 Family history of diabetes mellitus: Secondary | ICD-10-CM | POA: Diagnosis not present

## 2017-03-24 DIAGNOSIS — Z79899 Other long term (current) drug therapy: Secondary | ICD-10-CM | POA: Diagnosis not present

## 2017-03-24 DIAGNOSIS — F331 Major depressive disorder, recurrent, moderate: Secondary | ICD-10-CM | POA: Diagnosis not present

## 2017-03-24 DIAGNOSIS — Z90710 Acquired absence of both cervix and uterus: Secondary | ICD-10-CM | POA: Diagnosis not present

## 2017-03-24 DIAGNOSIS — Z818 Family history of other mental and behavioral disorders: Secondary | ICD-10-CM | POA: Diagnosis not present

## 2017-03-24 DIAGNOSIS — Z90722 Acquired absence of ovaries, bilateral: Secondary | ICD-10-CM | POA: Diagnosis not present

## 2017-03-24 DIAGNOSIS — Z8249 Family history of ischemic heart disease and other diseases of the circulatory system: Secondary | ICD-10-CM | POA: Diagnosis not present

## 2017-03-24 NOTE — Progress Notes (Signed)
    Daily Group Progress Note  Program: IOP  Group Time: 9:00-12:00  Participation Level: Active  Behavioral Response: Appropriate  Type of Therapy:  Group Therapy  Summary of Progress: Pt. Presents as talkative, engaged in the group process. Pt. Connected with other group members around of theme of being disappointed when important people in her life did not validate the changes that she was making in her life. Pt. Reported that she was having lunch with a friend today and she was very happy about that. Pt. Also discussed ongoing excitement about returning to school and looking forward to buying her books. Pt. Participated in discussion about the importance of giving self permission to have feelings of anger and sadness that might have been silenced by culture or childhood. Pt. Participated in discussion about [place] management facilitated by speaker from the consumer credit counseling agency.      Nancie Neas, LPC
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
## 2017-03-27 ENCOUNTER — Other Ambulatory Visit (HOSPITAL_COMMUNITY): Payer: 59

## 2017-03-28 ENCOUNTER — Other Ambulatory Visit (HOSPITAL_COMMUNITY): Payer: 59

## 2017-03-29 ENCOUNTER — Other Ambulatory Visit (HOSPITAL_COMMUNITY): Payer: 59

## 2017-03-30 ENCOUNTER — Other Ambulatory Visit (HOSPITAL_COMMUNITY): Payer: 59 | Admitting: Psychiatry

## 2017-03-30 DIAGNOSIS — F331 Major depressive disorder, recurrent, moderate: Secondary | ICD-10-CM | POA: Diagnosis not present

## 2017-03-30 DIAGNOSIS — Z90722 Acquired absence of ovaries, bilateral: Secondary | ICD-10-CM | POA: Diagnosis not present

## 2017-03-30 DIAGNOSIS — Z885 Allergy status to narcotic agent status: Secondary | ICD-10-CM | POA: Diagnosis not present

## 2017-03-30 DIAGNOSIS — Z818 Family history of other mental and behavioral disorders: Secondary | ICD-10-CM | POA: Diagnosis not present

## 2017-03-30 DIAGNOSIS — Z87891 Personal history of nicotine dependence: Secondary | ICD-10-CM | POA: Diagnosis not present

## 2017-03-30 DIAGNOSIS — Z79899 Other long term (current) drug therapy: Secondary | ICD-10-CM | POA: Diagnosis not present

## 2017-03-30 DIAGNOSIS — Z90710 Acquired absence of both cervix and uterus: Secondary | ICD-10-CM | POA: Diagnosis not present

## 2017-03-30 DIAGNOSIS — Z8249 Family history of ischemic heart disease and other diseases of the circulatory system: Secondary | ICD-10-CM | POA: Diagnosis not present

## 2017-03-30 DIAGNOSIS — Z833 Family history of diabetes mellitus: Secondary | ICD-10-CM | POA: Diagnosis not present

## 2017-03-31 ENCOUNTER — Other Ambulatory Visit (HOSPITAL_COMMUNITY): Payer: 59 | Admitting: Psychiatry

## 2017-03-31 DIAGNOSIS — Z833 Family history of diabetes mellitus: Secondary | ICD-10-CM | POA: Diagnosis not present

## 2017-03-31 DIAGNOSIS — Z90722 Acquired absence of ovaries, bilateral: Secondary | ICD-10-CM | POA: Diagnosis not present

## 2017-03-31 DIAGNOSIS — Z79899 Other long term (current) drug therapy: Secondary | ICD-10-CM | POA: Diagnosis not present

## 2017-03-31 DIAGNOSIS — F331 Major depressive disorder, recurrent, moderate: Secondary | ICD-10-CM

## 2017-03-31 DIAGNOSIS — Z8249 Family history of ischemic heart disease and other diseases of the circulatory system: Secondary | ICD-10-CM | POA: Diagnosis not present

## 2017-03-31 DIAGNOSIS — Z818 Family history of other mental and behavioral disorders: Secondary | ICD-10-CM | POA: Diagnosis not present

## 2017-03-31 DIAGNOSIS — Z90710 Acquired absence of both cervix and uterus: Secondary | ICD-10-CM | POA: Diagnosis not present

## 2017-03-31 DIAGNOSIS — Z87891 Personal history of nicotine dependence: Secondary | ICD-10-CM | POA: Diagnosis not present

## 2017-03-31 DIAGNOSIS — Z885 Allergy status to narcotic agent status: Secondary | ICD-10-CM | POA: Diagnosis not present
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
## 2017-03-31 NOTE — Progress Notes (Signed)
    Daily Group Progress Note  Program: IOP  Group Time: 9:00-12:00  Participation Level: Active  Behavioral Response: Appropriate  Type of Therapy:  Group Therapy  Summary of Progress: Pt. Presented as calm, alert, engaged in the group process. Pt. Discussed her fears of sabotaging her relationship. Pt. Discussed her pattern of excessive drinking to cope with job stress. Pt. Worked with counselor to identify the time of her drinking and to reduce the behavior with replacement behavior and nutrition, exercise and meditation. Pt. Participated in discussion facilitated by the [facility] Department about managing stress and reducing crime during the holidays.    Nancie Neas, LPC

## 2017-04-03 ENCOUNTER — Other Ambulatory Visit (HOSPITAL_COMMUNITY): Payer: 59

## 2017-04-04 ENCOUNTER — Other Ambulatory Visit (HOSPITAL_COMMUNITY): Payer: 59

## 2017-04-05 ENCOUNTER — Other Ambulatory Visit (HOSPITAL_COMMUNITY): Payer: 59 | Admitting: Psychiatry

## 2017-04-05 DIAGNOSIS — Z8249 Family history of ischemic heart disease and other diseases of the circulatory system: Secondary | ICD-10-CM | POA: Diagnosis not present

## 2017-04-05 DIAGNOSIS — Z818 Family history of other mental and behavioral disorders: Secondary | ICD-10-CM | POA: Diagnosis not present

## 2017-04-05 DIAGNOSIS — Z90722 Acquired absence of ovaries, bilateral: Secondary | ICD-10-CM | POA: Diagnosis not present

## 2017-04-05 DIAGNOSIS — Z87891 Personal history of nicotine dependence: Secondary | ICD-10-CM | POA: Diagnosis not present

## 2017-04-05 DIAGNOSIS — F331 Major depressive disorder, recurrent, moderate: Secondary | ICD-10-CM | POA: Diagnosis not present

## 2017-04-05 DIAGNOSIS — Z90710 Acquired absence of both cervix and uterus: Secondary | ICD-10-CM | POA: Diagnosis not present

## 2017-04-05 DIAGNOSIS — Z833 Family history of diabetes mellitus: Secondary | ICD-10-CM | POA: Diagnosis not present

## 2017-04-05 DIAGNOSIS — Z79899 Other long term (current) drug therapy: Secondary | ICD-10-CM | POA: Diagnosis not present

## 2017-04-05 DIAGNOSIS — Z885 Allergy status to narcotic agent status: Secondary | ICD-10-CM | POA: Diagnosis not present

## 2017-04-05 NOTE — Progress Notes (Signed)
    Daily Group Progress Note  Program: IOP  Group Time: 9:00-12:00  Participation Level: Active  Behavioral Response: Appropriate  Type of Therapy:  Group Therapy  Summary of Progress: Pt. Presents as calm, talkative, engaged in the therapeutic process. Pt. shared that she continues to struggle with managing stress due to her work schedule which is 3/12-14 hour work days. Pt. Was encouraged to focus on balancing her nutrition, sleep, and movement as foundational to her physical and mental health which she indicated were all poor. Pt. Participated in breath-focused meditation for stress reduction and grounding. Pt. Was encouraged to develop daily practice of breath focused meditation to help with stress management.     Nancie Neas, LPC

## 2017-04-06 ENCOUNTER — Other Ambulatory Visit (HOSPITAL_COMMUNITY): Payer: 59 | Admitting: Psychiatry

## 2017-04-06 DIAGNOSIS — Z90722 Acquired absence of ovaries, bilateral: Secondary | ICD-10-CM | POA: Diagnosis not present

## 2017-04-06 DIAGNOSIS — F331 Major depressive disorder, recurrent, moderate: Secondary | ICD-10-CM

## 2017-04-06 DIAGNOSIS — Z87891 Personal history of nicotine dependence: Secondary | ICD-10-CM | POA: Diagnosis not present

## 2017-04-06 DIAGNOSIS — Z833 Family history of diabetes mellitus: Secondary | ICD-10-CM | POA: Diagnosis not present

## 2017-04-06 DIAGNOSIS — Z79899 Other long term (current) drug therapy: Secondary | ICD-10-CM | POA: Diagnosis not present

## 2017-04-06 DIAGNOSIS — Z8249 Family history of ischemic heart disease and other diseases of the circulatory system: Secondary | ICD-10-CM | POA: Diagnosis not present

## 2017-04-06 DIAGNOSIS — Z818 Family history of other mental and behavioral disorders: Secondary | ICD-10-CM | POA: Diagnosis not present

## 2017-04-06 DIAGNOSIS — Z90710 Acquired absence of both cervix and uterus: Secondary | ICD-10-CM | POA: Diagnosis not present

## 2017-04-06 DIAGNOSIS — Z885 Allergy status to narcotic agent status: Secondary | ICD-10-CM | POA: Diagnosis not present

## 2017-04-06 NOTE — Progress Notes (Signed)
    Daily Group Progress Note  Program: IOP  Group Time: 9:00-12:00  Participation Level: Active  Behavioral Response: Appropriate  Type of Therapy:  Group Therapy  Summary of Progress: Pt. Presented with bright affect, talkative, engaged in the group process. Pt. Shared with group that she had a very good day yesterday with dinner with family, went to movie with her boyfriend and Christmas shopping. Pt. Woke up today with a head cold and reported to the group that she was not feeling good.      Nancie Neas, LPC

## 2017-04-07 ENCOUNTER — Other Ambulatory Visit (HOSPITAL_COMMUNITY): Payer: 59

## 2017-04-10 ENCOUNTER — Other Ambulatory Visit (HOSPITAL_COMMUNITY): Payer: 59

## 2017-04-12 ENCOUNTER — Other Ambulatory Visit (HOSPITAL_COMMUNITY): Payer: 59

## 2017-04-13 ENCOUNTER — Other Ambulatory Visit (HOSPITAL_COMMUNITY): Payer: 59 | Admitting: Psychiatry

## 2017-04-13 DIAGNOSIS — Z833 Family history of diabetes mellitus: Secondary | ICD-10-CM | POA: Diagnosis not present

## 2017-04-13 DIAGNOSIS — Z90710 Acquired absence of both cervix and uterus: Secondary | ICD-10-CM | POA: Diagnosis not present

## 2017-04-13 DIAGNOSIS — F331 Major depressive disorder, recurrent, moderate: Secondary | ICD-10-CM

## 2017-04-13 DIAGNOSIS — Z818 Family history of other mental and behavioral disorders: Secondary | ICD-10-CM | POA: Diagnosis not present

## 2017-04-13 DIAGNOSIS — Z8249 Family history of ischemic heart disease and other diseases of the circulatory system: Secondary | ICD-10-CM | POA: Diagnosis not present

## 2017-04-13 DIAGNOSIS — Z885 Allergy status to narcotic agent status: Secondary | ICD-10-CM | POA: Diagnosis not present

## 2017-04-13 DIAGNOSIS — Z79899 Other long term (current) drug therapy: Secondary | ICD-10-CM | POA: Diagnosis not present

## 2017-04-13 DIAGNOSIS — Z90722 Acquired absence of ovaries, bilateral: Secondary | ICD-10-CM | POA: Diagnosis not present

## 2017-04-13 DIAGNOSIS — Z87891 Personal history of nicotine dependence: Secondary | ICD-10-CM | POA: Diagnosis not present
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
## 2017-04-14 ENCOUNTER — Other Ambulatory Visit (HOSPITAL_COMMUNITY): Payer: 59 | Admitting: Psychiatry

## 2017-04-14 DIAGNOSIS — Z87891 Personal history of nicotine dependence: Secondary | ICD-10-CM | POA: Diagnosis not present

## 2017-04-14 DIAGNOSIS — Z818 Family history of other mental and behavioral disorders: Secondary | ICD-10-CM | POA: Diagnosis not present

## 2017-04-14 DIAGNOSIS — Z833 Family history of diabetes mellitus: Secondary | ICD-10-CM | POA: Diagnosis not present

## 2017-04-14 DIAGNOSIS — Z885 Allergy status to narcotic agent status: Secondary | ICD-10-CM | POA: Diagnosis not present

## 2017-04-14 DIAGNOSIS — F331 Major depressive disorder, recurrent, moderate: Secondary | ICD-10-CM

## 2017-04-14 DIAGNOSIS — Z8249 Family history of ischemic heart disease and other diseases of the circulatory system: Secondary | ICD-10-CM | POA: Diagnosis not present

## 2017-04-14 DIAGNOSIS — Z90722 Acquired absence of ovaries, bilateral: Secondary | ICD-10-CM | POA: Diagnosis not present

## 2017-04-14 DIAGNOSIS — Z90710 Acquired absence of both cervix and uterus: Secondary | ICD-10-CM | POA: Diagnosis not present

## 2017-04-14 DIAGNOSIS — Z79899 Other long term (current) drug therapy: Secondary | ICD-10-CM | POA: Diagnosis not present
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
## 2017-04-17 ENCOUNTER — Other Ambulatory Visit (HOSPITAL_COMMUNITY): Payer: 59

## 2017-04-19 ENCOUNTER — Other Ambulatory Visit (HOSPITAL_COMMUNITY): Payer: 59 | Attending: Psychiatry | Admitting: Psychiatry

## 2017-04-19 DIAGNOSIS — F419 Anxiety disorder, unspecified: Secondary | ICD-10-CM | POA: Insufficient documentation

## 2017-04-19 DIAGNOSIS — F331 Major depressive disorder, recurrent, moderate: Secondary | ICD-10-CM | POA: Insufficient documentation

## 2017-04-20 ENCOUNTER — Other Ambulatory Visit (HOSPITAL_COMMUNITY): Payer: 59

## 2017-04-20 NOTE — Progress Notes (Signed)
    Daily Group Progress Note  Program: IOP  Group Time: 9:00-12:00  Participation Level: Active  Behavioral Response: Appropriate  Type of Therapy:  Group Therapy  Summary of Progress: Pt. Presents with bright affect, talkative, engaged in the group process. Pt. Shared with the group that she feels "numb" most of the time, but that she wants to feel happy with herself, to stop stressing over everything. Pt. Shared that she was successful over the weekend finding 20 minutes to spend along, but that it was very difficult. Pt. Tried to read for the time and she said that it was very hard. Pt. Was encouraged to continue trying for shorter periods and to slowly build her time. Pt.'s participated in discussion about finding a word of intention for 2019 and Pt. Chose the word "contentment". Pt. Discussed her plans to start her BSN program next week and was excited about this. Pt. Participated in wellness seminar with Frederich Balding about developing nutrition, exercise, and sleep plans.     Nancie Neas, LPC

## 2017-04-21 ENCOUNTER — Other Ambulatory Visit (HOSPITAL_COMMUNITY): Payer: 59

## 2017-04-24 ENCOUNTER — Other Ambulatory Visit (HOSPITAL_COMMUNITY): Payer: 59

## 2017-04-25 ENCOUNTER — Other Ambulatory Visit (HOSPITAL_COMMUNITY): Payer: 59

## 2017-04-26 ENCOUNTER — Other Ambulatory Visit (HOSPITAL_COMMUNITY): Payer: 59 | Admitting: Psychiatry

## 2017-04-26 DIAGNOSIS — F331 Major depressive disorder, recurrent, moderate: Secondary | ICD-10-CM

## 2017-04-26 DIAGNOSIS — F419 Anxiety disorder, unspecified: Secondary | ICD-10-CM | POA: Diagnosis not present

## 2017-04-26 NOTE — Progress Notes (Signed)
[place] IOP DISCHARGE NOTE  Patient:  Carol Mooney DOB:  22-Jan-1976  Date of Admission: 03/22/2017  Date of Discharge:04/26/2017   Reason for Admission:depression and anxiety  IOP Course:attended and participated.  Much less depressed, less anxious and more optimistic.  Mental Status at Discharge:no suicidal thoughts  Diagnosis:major depression recurrent moderate  Level of Care:  IOP  Discharge destination: has appointment with her psychiatrist and will make an appointment with a therapist her psychiatrist recommends    Comments:  Did very well in learning new coping mechanisms and putting them into practice  The patient received suicide prevention pamphlet:  Yes   Donnelly Angelica, MD

## 2017-04-26 NOTE — Patient Instructions (Signed)
D:  Pt successfully completed MH-IOP today.  A:  Follow up with Dr. Nicolasa Ducking on 05-02-17.  Plans to follow up with a therapist that Dr. Nicolasa Ducking is recommending.  R:  Patient is receptive.

## 2017-04-26 NOTE — Progress Notes (Signed)
    Daily Group Progress Note  Program: IOP  Group Time: 9:00-12:00  Participation Level: Active  Behavioral Response: Appropriate  Type of Therapy:  Group Therapy  Summary of Progress: Pt. Prepared for discharge. Pt. Met with the case manager and psychiatrist. Pt. Presented as talkative, engaged in the group process, with bright affect. Pt. Shared her plans to start classes for her BSN tomorrow, continue with her individual therapist, setting personal workplace boundaries and commitment to self-care, and pushing back and thoughts of perfectionism. Pt. Participated in discussion facilitated by the [facility] center.      Nancie Neas, LPC

## 2017-04-26 NOTE — Progress Notes (Signed)
Carol Mooney is a 42 y.o. , divorced, employed, Caucasian female who was referrred per a former patient; treatment for worsening depressive and anxiety symptoms.  Pt denied and continues to deny any SI/HI or A/V hallucinations.  Trigger/Stressor:  1) Job [employer]) of six yrs. Pt works third shift weekend option.  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  "It has been very stressful because we are doing this reset program.  It is when you take any type of pt.  The other night I had three coming off ETOH and three coming off heroine."  Pt states she has been written up due to an anger outburst at work.  Pt admits to self medicating with ETOH.  Admitted to drinking up to 6-8 beers ~ once a month to relax/sleep.  Reports she had two beers nightly.  Denies any prior psychiatric admissions.  Denies any prior suicide attempts/gestures.  Has been seeing Dr. Nicolasa Ducking on an outpatient basis; hx of seeing a marital therapist in the past.  Reports she's been dx with depression since age 72.  Family hx:  2 sisters and father (depression); P-GF (ETOH); M-Uncle (Drugs).  Pt will be discharged today.  Reports overall mood improved.  States she has learned stress management techniques other than depending on ETOH.  "I go for walks to relieve the stress.  Now I only drink a couple of beers instead of every night."  Pt continues to practicing setting limits with others.  Pt is excited about starting back to school tomorrow to obtain her BSN.  A:  D/C today.  F/U with Dr. Nicolasa Ducking and therapist that Nicolasa Ducking has recommended.  Encouraged support groups.  R:  Pt receptive.                                                                  Carlis Abbott, RITA, M.Ed, CNA

## 2017-04-27 ENCOUNTER — Other Ambulatory Visit (HOSPITAL_COMMUNITY): Payer: 59

## 2017-04-28 ENCOUNTER — Other Ambulatory Visit (HOSPITAL_COMMUNITY): Payer: 59

## 2017-05-01 ENCOUNTER — Other Ambulatory Visit (HOSPITAL_COMMUNITY): Payer: 59

## 2017-05-02 ENCOUNTER — Other Ambulatory Visit
Admission: RE | Admit: 2017-05-02 | Discharge: 2017-05-02 | Disposition: A | Discharge status: Home / Self Care | Payer: 59 | Source: Ambulatory Visit | Attending: Psychiatry | Admitting: Psychiatry

## 2017-05-02 ENCOUNTER — Other Ambulatory Visit (HOSPITAL_COMMUNITY): Payer: 59

## 2017-05-02 DIAGNOSIS — F411 Generalized anxiety disorder: Secondary | ICD-10-CM | POA: Diagnosis not present

## 2017-05-02 DIAGNOSIS — F330 Major depressive disorder, recurrent, mild: Secondary | ICD-10-CM | POA: Insufficient documentation

## 2017-05-02 DIAGNOSIS — F5105 Insomnia due to other mental disorder: Secondary | ICD-10-CM | POA: Diagnosis not present

## 2017-05-02 LAB — VITAMIN B12: Vitamin B-12: 361 pg/mL (ref 180–914)

## 2017-05-02 LAB — FOLATE: Folate: 8 ng/mL (ref >5.9)

## 2017-05-02 LAB — TSH: TSH: 0.986 u[IU]/mL (ref 0.350–4.500)

## 2017-05-03 ENCOUNTER — Other Ambulatory Visit (HOSPITAL_COMMUNITY): Payer: 59

## 2017-05-04 ENCOUNTER — Other Ambulatory Visit (HOSPITAL_COMMUNITY): Payer: 59

## 2017-05-05 ENCOUNTER — Other Ambulatory Visit (HOSPITAL_COMMUNITY): Payer: 59

## 2017-05-08 ENCOUNTER — Other Ambulatory Visit (HOSPITAL_COMMUNITY): Payer: 59

## 2017-05-09 ENCOUNTER — Other Ambulatory Visit (HOSPITAL_COMMUNITY): Payer: 59

## 2017-05-10 ENCOUNTER — Other Ambulatory Visit (HOSPITAL_COMMUNITY): Payer: 59

## 2017-05-11 ENCOUNTER — Other Ambulatory Visit (HOSPITAL_COMMUNITY): Payer: 59

## 2017-05-12 ENCOUNTER — Other Ambulatory Visit (HOSPITAL_COMMUNITY): Payer: 59

## 2017-05-15 ENCOUNTER — Other Ambulatory Visit (HOSPITAL_COMMUNITY): Payer: 59

## 2017-05-16 ENCOUNTER — Other Ambulatory Visit (HOSPITAL_COMMUNITY): Payer: 59

## 2017-05-17 ENCOUNTER — Encounter: Payer: Self-pay | Admitting: Family Medicine

## 2017-05-17 ENCOUNTER — Ambulatory Visit (INDEPENDENT_AMBULATORY_CARE_PROVIDER_SITE_OTHER): Payer: 59 | Admitting: Family Medicine

## 2017-05-17 VITALS — BP 110/78 | HR 101 | Temp 98.4°F | Ht 66.0 in | Wt 174.0 lb

## 2017-05-17 DIAGNOSIS — Z23 Encounter for immunization: Secondary | ICD-10-CM

## 2017-05-17 DIAGNOSIS — Z13220 Encounter for screening for lipoid disorders: Secondary | ICD-10-CM | POA: Diagnosis not present

## 2017-05-17 DIAGNOSIS — Z020 Encounter for examination for admission to educational institution: Secondary | ICD-10-CM

## 2017-05-17 NOTE — Patient Instructions (Addendum)
We will call with the lab results. Please check on the TB test protocol. You can always call me if anything additional is required.

## 2017-05-17 NOTE — Progress Notes (Signed)
Subjective:     Patient ID: Carol Mooney, female   DOB: Feb 11, 1976, 42 y.o.   MRN: [id number] Chief Complaint  Patient presents with  . Annual Exam    Patient presents for a CPE for school. 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 She reports well feeling .She is not exercising. She sleeps fair.    HPI Followed by Dr. Nicolasa Ducking for depression. She will be starting clinicals in the Fall and needs her medical clearance. [place] Form reviewed.  Review of Systems  Endocrine:       Has not had a lipid profile recently so will add.       Objective:   Physical Exam  Constitutional: She appears well-developed and well-nourished. No distress.  Eyes: PERRL Neck: no thyromegaly, tenderness or nodules, no cervical adenopathy ENT: TM's intact without inflammation; moderate tonsillar enlargement without eyrthem Lungs: Clear Heart : RRR without murmur or gallop Abd: bowel sounds present, soft, non-tender, no organomegaly      Assessment:    1. School physical exam - Measles/Mumps/Rubella Immunity  2. Need for diphtheria-tetanus-pertussis (Tdap) vaccine Tdap administered 3. Screening for cholesterol level - Lipid panel    Plan:    She will check with employee health regarding 3 step TB test. Further f/u pending lab work. Will complete form when labs completed.

## 2017-05-18 ENCOUNTER — Telehealth: Payer: Self-pay

## 2017-05-18 LAB — MEASLES/MUMPS/RUBELLA IMMUNITY
MUMPS ABS, IGG: <9 AU/mL — ABNORMAL LOW (ref >10.9)
RUBEOLA AB, IGG: 54.4 AU/mL (ref >29.9)
Rubella Antibodies, IGG: 2.7 index (ref >0.99)

## 2017-05-18 LAB — LIPID PANEL
Chol/HDL Ratio: 2.4 ratio (ref 0.0–4.4)
Cholesterol, Total: 169 mg/dL (ref 100–199)
HDL: 71 mg/dL (ref >39)
LDL Calculated: 76 mg/dL (ref 0–99)
Triglycerides: 112 mg/dL (ref 0–149)
VLDL Cholesterol Cal: 22 mg/dL (ref 5–40)

## 2017-05-18 NOTE — Telephone Encounter (Signed)
-----   Message from Carmon Ginsberg, [place] sent at 05/18/2017  7:52 AM EST ----- Cholesterol profile is great. Still awaiting complete results of your immunity testing

## 2017-05-18 NOTE — Telephone Encounter (Signed)
Patient advised.KW 

## 2017-06-29 ENCOUNTER — Other Ambulatory Visit: Payer: Self-pay | Admitting: Family Medicine

## 2017-06-29 DIAGNOSIS — B009 Herpesviral infection, unspecified: Secondary | ICD-10-CM

## 2017-07-31 DIAGNOSIS — F411 Generalized anxiety disorder: Secondary | ICD-10-CM | POA: Diagnosis not present

## 2017-07-31 DIAGNOSIS — F5105 Insomnia due to other mental disorder: Secondary | ICD-10-CM | POA: Diagnosis not present

## 2017-07-31 DIAGNOSIS — F330 Major depressive disorder, recurrent, mild: Secondary | ICD-10-CM | POA: Diagnosis not present

## 2017-08-11 DIAGNOSIS — L03211 Cellulitis of face: Secondary | ICD-10-CM | POA: Diagnosis not present

## 2017-08-12 ENCOUNTER — Other Ambulatory Visit: Payer: Self-pay

## 2017-08-12 ENCOUNTER — Emergency Department: Payer: 59

## 2017-08-12 ENCOUNTER — Inpatient Hospital Stay
Admission: EM | Admit: 2017-08-12 | Discharge: 2017-08-15 | DRG: 603 | Disposition: A | Discharge status: Home / Self Care | Payer: 59 | Attending: Internal Medicine | Admitting: Internal Medicine

## 2017-08-12 ENCOUNTER — Encounter: Payer: Self-pay | Admitting: Emergency Medicine

## 2017-08-12 DIAGNOSIS — L03211 Cellulitis of face: Principal | ICD-10-CM | POA: Diagnosis present

## 2017-08-12 DIAGNOSIS — Z8614 Personal history of Methicillin resistant Staphylococcus aureus infection: Secondary | ICD-10-CM

## 2017-08-12 DIAGNOSIS — R7301 Impaired fasting glucose: Secondary | ICD-10-CM | POA: Diagnosis present

## 2017-08-12 DIAGNOSIS — Z79899 Other long term (current) drug therapy: Secondary | ICD-10-CM

## 2017-08-12 DIAGNOSIS — F17210 Nicotine dependence, cigarettes, uncomplicated: Secondary | ICD-10-CM | POA: Diagnosis present

## 2017-08-12 DIAGNOSIS — Z90710 Acquired absence of both cervix and uterus: Secondary | ICD-10-CM | POA: Diagnosis not present

## 2017-08-12 DIAGNOSIS — D329 Benign neoplasm of meninges, unspecified: Secondary | ICD-10-CM | POA: Diagnosis not present

## 2017-08-12 DIAGNOSIS — R51 Headache: Secondary | ICD-10-CM | POA: Diagnosis not present

## 2017-08-12 DIAGNOSIS — Z885 Allergy status to narcotic agent status: Secondary | ICD-10-CM

## 2017-08-12 DIAGNOSIS — L0390 Cellulitis, unspecified: Secondary | ICD-10-CM

## 2017-08-12 DIAGNOSIS — F329 Major depressive disorder, single episode, unspecified: Secondary | ICD-10-CM | POA: Diagnosis present

## 2017-08-12 DIAGNOSIS — Z818 Family history of other mental and behavioral disorders: Secondary | ICD-10-CM

## 2017-08-12 DIAGNOSIS — F419 Anxiety disorder, unspecified: Secondary | ICD-10-CM | POA: Diagnosis present

## 2017-08-12 DIAGNOSIS — D320 Benign neoplasm of cerebral meninges: Secondary | ICD-10-CM | POA: Diagnosis not present

## 2017-08-12 DIAGNOSIS — L0201 Cutaneous abscess of face: Secondary | ICD-10-CM | POA: Diagnosis not present

## 2017-08-12 DIAGNOSIS — Z716 Tobacco abuse counseling: Secondary | ICD-10-CM | POA: Diagnosis not present

## 2017-08-12 DIAGNOSIS — L03213 Periorbital cellulitis: Secondary | ICD-10-CM

## 2017-08-12 DIAGNOSIS — R229 Localized swelling, mass and lump, unspecified: Secondary | ICD-10-CM | POA: Diagnosis not present

## 2017-08-12 HISTORY — DX: Benign neoplasm of meninges, unspecified: D32.9

## 2017-08-12 LAB — CBC
HCT: 43.1 % (ref 35.0–47.0)
Hemoglobin: 14.7 g/dL (ref 12.0–16.0)
MCH: 33.5 pg (ref 26.0–34.0)
MCHC: 34.1 g/dL (ref 32.0–36.0)
MCV: 98.2 fL (ref 80.0–100.0)
Platelets: 299 10*3/uL (ref 150–440)
RBC: 4.39 MIL/uL (ref 3.80–5.20)
RDW: 13.3 % (ref 11.5–14.5)
WBC: 12.6 10*3/uL — ABNORMAL HIGH (ref 3.6–11.0)

## 2017-08-12 LAB — BASIC METABOLIC PANEL
Anion gap: 10 (ref 5–15)
BUN: 10 mg/dL (ref 6–20)
CO2: 27 mmol/L (ref 22–32)
Calcium: 9.4 mg/dL (ref 8.9–10.3)
Chloride: 100 mmol/L — ABNORMAL LOW (ref 101–111)
Creatinine, Ser: 0.8 mg/dL (ref 0.44–1.00)
GFR calc Af Amer: >60 mL/min (ref >60)
GFR calc non Af Amer: >60 mL/min (ref >60)
Glucose, Bld: 140 mg/dL — ABNORMAL HIGH (ref 65–99)
Potassium: 3.4 mmol/L — ABNORMAL LOW (ref 3.5–5.1)
Sodium: 137 mmol/L (ref 135–145)

## 2017-08-12 LAB — LACTIC ACID, PLASMA: Lactic Acid, Venous: 1.8 mmol/L (ref 0.5–1.9)

## 2017-08-12 MED ORDER — OXYCODONE-ACETAMINOPHEN 5-325 MG PO TABS
1.0000 | ORAL_TABLET | Freq: Once | ORAL | Status: AC
  Administered 2017-08-12: 1 via ORAL
  Filled 2017-08-12: qty 1

## 2017-08-12 MED ORDER — DULOXETINE HCL 30 MG PO CPEP
90.0000 mg | ORAL_CAPSULE | Freq: Every day | ORAL | Status: DC
  Administered 2017-08-13 – 2017-08-14 (×2): 90 mg via ORAL
  Filled 2017-08-12 (×2): qty 3

## 2017-08-12 MED ORDER — ACETAMINOPHEN 325 MG PO TABS: 650.0000 mg | ORAL_TABLET | Freq: Four times a day (QID) | ORAL | Status: DC | PRN

## 2017-08-12 MED ORDER — HYDROCODONE-ACETAMINOPHEN 5-325 MG PO TABS
1.0000 | ORAL_TABLET | Freq: Four times a day (QID) | ORAL | Status: DC | PRN
  Administered 2017-08-12 – 2017-08-13 (×3): 1 via ORAL
  Filled 2017-08-12 (×2): qty 1

## 2017-08-12 MED ORDER — VANCOMYCIN HCL 10 G IV SOLR
1.0000 g | Freq: Once | INTRAVENOUS | Status: DC
  Filled 2017-08-12: qty 1000

## 2017-08-12 MED ORDER — HYDROMORPHONE HCL 1 MG/ML IJ SOLN
0.5000 mg | Freq: Once | INTRAMUSCULAR | Status: AC
  Administered 2017-08-12: 0.5 mg via INTRAVENOUS

## 2017-08-12 MED ORDER — IOPAMIDOL (ISOVUE-370) INJECTION 76%
75.0000 mL | Freq: Once | INTRAVENOUS | Status: AC | PRN
  Administered 2017-08-12: 75 mL via INTRAVENOUS

## 2017-08-12 MED ORDER — HYDROMORPHONE HCL 1 MG/ML IJ SOLN
INTRAMUSCULAR | Status: AC
  Filled 2017-08-12: qty 1

## 2017-08-12 MED ORDER — DULOXETINE HCL 30 MG PO CPEP
60.0000 mg | ORAL_CAPSULE | Freq: Once | ORAL | Status: AC
  Administered 2017-08-12: 60 mg via ORAL
  Filled 2017-08-12: qty 2

## 2017-08-12 MED ORDER — GADOBENATE DIMEGLUMINE 529 MG/ML IV SOLN
15.0000 mL | Freq: Once | INTRAVENOUS | Status: AC | PRN
  Administered 2017-08-12: 15 mL via INTRAVENOUS

## 2017-08-12 MED ORDER — POLYETHYLENE GLYCOL 3350 17 G PO PACK
17.0000 g | PACK | Freq: Every day | ORAL | Status: DC
  Administered 2017-08-13 – 2017-08-15 (×3): 17 g via ORAL
  Filled 2017-08-12 (×3): qty 1

## 2017-08-12 MED ORDER — SODIUM CHLORIDE 0.9 % IV SOLN
1.0000 g | Freq: Once | INTRAVENOUS | Status: AC
  Administered 2017-08-12: 1 g via INTRAVENOUS
  Filled 2017-08-12: qty 10

## 2017-08-12 MED ORDER — LEVONORGEST-ETH ESTRAD 91-DAY 0.15-0.03 MG PO TABS: 1.0000 | ORAL_TABLET | Freq: Every day | ORAL | Status: DC

## 2017-08-12 MED ORDER — DOCUSATE SODIUM 100 MG PO CAPS
100.0000 mg | ORAL_CAPSULE | Freq: Two times a day (BID) | ORAL | Status: DC | PRN
  Administered 2017-08-14 – 2017-08-15 (×2): 100 mg via ORAL
  Filled 2017-08-12 (×2): qty 1

## 2017-08-12 MED ORDER — DULOXETINE HCL 30 MG PO CPEP
30.0000 mg | ORAL_CAPSULE | Freq: Every day | ORAL | Status: DC
  Administered 2017-08-12: 30 mg via ORAL
  Filled 2017-08-12: qty 1

## 2017-08-12 MED ORDER — VANCOMYCIN HCL IN DEXTROSE 1-5 GM/200ML-% IV SOLN
1000.0000 mg | Freq: Once | INTRAVENOUS | Status: AC
  Administered 2017-08-12: 1000 mg via INTRAVENOUS

## 2017-08-12 MED ORDER — CALCIUM CARBONATE-VITAMIN D 500-200 MG-UNIT PO TABS
1.0000 | ORAL_TABLET | Freq: Every day | ORAL | Status: DC
  Administered 2017-08-13 – 2017-08-15 (×3): 1 via ORAL
  Filled 2017-08-12 (×3): qty 1

## 2017-08-12 MED ORDER — HYDROCODONE-ACETAMINOPHEN 5-325 MG PO TABS
ORAL_TABLET | ORAL | Status: AC
  Administered 2017-08-12: 1 via ORAL
  Filled 2017-08-12: qty 1

## 2017-08-12 MED ORDER — HYDROMORPHONE HCL 1 MG/ML IJ SOLN
0.5000 mg | Freq: Once | INTRAMUSCULAR | Status: AC
  Administered 2017-08-12: 0.5 mg via INTRAVENOUS
  Filled 2017-08-12: qty 1

## 2017-08-12 MED ORDER — ONDANSETRON HCL 4 MG/2ML IJ SOLN
4.0000 mg | Freq: Once | INTRAMUSCULAR | Status: AC
Start: 2017-08-12 — End: 2017-08-12
  Administered 2017-08-12: 4 mg via INTRAVENOUS
  Filled 2017-08-12: qty 2

## 2017-08-12 MED ORDER — HEPARIN SODIUM (PORCINE) 5000 UNIT/ML IJ SOLN
5000.0000 [IU] | Freq: Three times a day (TID) | INTRAMUSCULAR | Status: DC
  Administered 2017-08-13 – 2017-08-15 (×7): 5000 [IU] via SUBCUTANEOUS
  Filled 2017-08-12 (×7): qty 1

## 2017-08-12 MED ORDER — SENNA 8.6 MG PO TABS
1.0000 | ORAL_TABLET | Freq: Every day | ORAL | Status: DC | PRN
Start: 2017-08-12 — End: 2017-08-15
  Administered 2017-08-14: 8.6 mg via ORAL
  Filled 2017-08-12: qty 1

## 2017-08-12 MED ORDER — CLINDAMYCIN PHOSPHATE 600 MG/50ML IV SOLN
600.0000 mg | Freq: Three times a day (TID) | INTRAVENOUS | Status: DC
  Administered 2017-08-12 – 2017-08-15 (×10): 600 mg via INTRAVENOUS
  Filled 2017-08-12 (×15): qty 50

## 2017-08-12 MED ORDER — NICOTINE 21 MG/24HR TD PT24
21.0000 mg | MEDICATED_PATCH | Freq: Every day | TRANSDERMAL | Status: DC
  Administered 2017-08-12 – 2017-08-15 (×4): 21 mg via TRANSDERMAL
  Filled 2017-08-12 (×4): qty 1

## 2017-08-12 MED ORDER — VANCOMYCIN HCL IN DEXTROSE 1-5 GM/200ML-% IV SOLN
INTRAVENOUS | Status: AC
  Administered 2017-08-12: 1000 mg via INTRAVENOUS
  Filled 2017-08-12: qty 200

## 2017-08-12 MED ORDER — HYDROMORPHONE HCL 1 MG/ML IJ SOLN
1.0000 mg | INTRAMUSCULAR | Status: DC | PRN
  Administered 2017-08-12 – 2017-08-15 (×10): 1 mg via INTRAVENOUS
  Filled 2017-08-12 (×10): qty 1

## 2017-08-12 MED ORDER — LORAZEPAM 1 MG PO TABS
1.0000 mg | ORAL_TABLET | Freq: Once | ORAL | Status: AC
  Administered 2017-08-12: 1 mg via ORAL
  Filled 2017-08-12: qty 1

## 2017-08-12 MED ORDER — HYDROXYZINE HCL 25 MG PO TABS
50.0000 mg | ORAL_TABLET | Freq: Two times a day (BID) | ORAL | Status: DC
  Administered 2017-08-12 – 2017-08-15 (×6): 50 mg via ORAL
  Filled 2017-08-12 (×6): qty 2

## 2017-08-12 NOTE — ED Notes (Signed)
Lights dimmed and door shut, pt waiting for MRI. Denies any other needs at this time.

## 2017-08-12 NOTE — ED Notes (Addendum)
Pt states left side of face swelling started yesterday. Pt states started as a bump and started on atbx, but states it much worse now. Swelling noted to left side of face and eye. Pt denies any pain in her mouth.  Pt states she has hx of MRSA in 2017.

## 2017-08-12 NOTE — ED Notes (Signed)
MRI called, pt will not go to MRI until about 245 or 3pm.  MRI will call when to medicate patient for MRI. Pt and Dr. Cinda Quest updated.

## 2017-08-12 NOTE — ED Provider Notes (Addendum)
[facility] Emergency Department Provider Note   ____________________________________________   First MD Initiated Contact with Patient 08/12/17 [phone]     (approximate)  I have reviewed the triage vital signs and the nursing notes.   HISTORY  Chief Complaint Facial Swelling    HPI Carol Mooney is a 42 y.o. female who reports she had a little bump on her left cheek 2 days ago.  Yesterday it got worse she went to see the doctor she got some p.o. Norco and Bactrim.  Today is much worse much more painful red swelling up under the eye the whole area around the zygoma and going down toward the chin.  She denies any nasal stuffiness ear pain sore throat  Toothache or any other problem.  Just severe pain in the face with the swelling is.  She says she has a history of MRSA in the past.  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   Past Medical History:  Diagnosis Date  . Allergy   . Anxiety   . Complication of anesthesia    nausea and vomiting  . Depression   . PONV (postoperative nausea and vomiting)     Patient Active Problem List   Diagnosis Date Noted  . Depression, major, recurrent, moderate ([place]) 03/22/2017  . History of MRSA infection 02/11/2016  . Preseptal cellulitis 02/03/2016  . Protein-calorie malnutrition, severe 02/03/2016  . Post-operative state 12/07/2015  . Endometriosis 09/01/2015  . Clinical depression 10/13/2014    Past Surgical History:  Procedure Laterality Date  . c sections    . HYSTERECTOMY ABDOMINAL WITH SALPINGECTOMY Bilateral 12/07/2015   Procedure: HYSTERECTOMY ABDOMINAL WITH SALPINGECTOMY/EXCISION OF ENDOMETRIOS;  Surgeon: Boykin Nearing, MD;  Location: [facility] ORS;  Service: Gynecology;  Laterality: Bilateral;  . OOPHORECTOMY Bilateral 12/07/2015   Procedure: OOPHORECTOMY;  Surgeon: Boykin Nearing, MD;  Location: [facility] ORS;  Service: Gynecology;  Laterality: Bilateral;  . TUBAL LIGATION  2000  . UTERINE STENT  PLACEMENT Bilateral 12/07/2015   Procedure: UTERINE STENT PLACEMENT/REMOVAL;  Surgeon: Boykin Nearing, MD;  Location: [facility] ORS;  Service: Gynecology;  Laterality: Bilateral;    Prior to Admission medications   Medication Sig Start Date End Date Taking? Authorizing Provider  acetaminophen (TYLENOL) 325 MG tablet Take 650 mg by mouth every 6 (six) hours as needed.    [provider]  Calcium Carb-Cholecalciferol (CALCIUM + D3) 600-200 MG-UNIT TABS Take 1 tablet by mouth daily.    [provider]  DULoxetine (CYMBALTA) 60 MG capsule  04/19/16   [provider]  hydrOXYzine (ATARAX/VISTARIL) 50 MG tablet  05/27/16   [provider]  levonorgestrel-ethinyl estradiol (SEASONALE,INTROVALE,JOLESSA) 0.15-0.03 MG tablet Take 1 tablet by mouth daily.    [provider]  polyethylene glycol (MIRALAX / GLYCOLAX) packet Take 17 g by mouth daily.    [provider]  senna (SENOKOT) 8.6 MG TABS tablet Take 1 tablet by mouth daily as needed for mild constipation.    [provider]  temazepam (RESTORIL) 15 MG capsule  04/19/16   [provider]  valACYclovir (VALTREX) 1000 MG tablet TAKE 2 TABLETS BY MOUTH EVERY 12 HOURS FOR 1 DAY AT ONSET OF COLD SORE 06/29/17   Carmon Ginsberg, PA    Allergies Morphine and Morphine and related  Family History  Problem Relation Age of Onset  . Coronary artery disease Father   . Depression Father   . Diabetes Father        insulin  dependent  . Depression Brother   . Cancer Maternal Grandmother        breast  . Breast cancer Maternal Grandmother 71  . Depression Sister   . Breast cancer Paternal Grandmother 17    Social History Social History   Tobacco Use  . Smoking status: Current Every Day Smoker    Packs/day: 0.50    Types: Cigarettes    Last attempt to quit: 05/19/2013    Years since quitting: 4.2  . Smokeless tobacco: Never Used  Substance Use Topics  . Alcohol use: Yes     Alcohol/week: 1.2 oz    Types: 2 Shots of liquor per week    Comment: very rarely  . Drug use: No    Review of Systems  Constitutional: No fever/chills Eyes: No visual changes. ENT: No sore throat. Cardiovascular: Denies chest pain. Respiratory: Denies shortness of breath. Gastrointestinal: No abdominal pain.  No nausea, no vomiting.  No diarrhea.  No constipation. Genitourinary: Negative for dysuria. Musculoskeletal: Negative for back pain. Skin: Negative for rash. Neurological: Negative for headaches, focal weakness   ____________________________________________   PHYSICAL EXAM:  VITAL SIGNS: ED Triage Vitals  Enc Vitals Group     BP 08/12/17 0946 123/82     Pulse Rate 08/12/17 0946 (!) 127     Resp 08/12/17 0946 18     Temp 08/12/17 0946 98.6 F (37 C)     Temp Source 08/12/17 0946 Oral     SpO2 08/12/17 0946 97 %     Weight 08/12/17 0947 161 lb (73 kg)     Height 08/12/17 0947 5\' 5"  (1.651 m)     Head Circumference --      Peak Flow --      Pain Score 08/12/17 0946 8     Pain Loc --      Pain Edu? --      Excl. in [place]? --     Constitutional: Alert and oriented. Well appearing and in no acute distress. Eyes: Conjunctivae are normal. PERRL. EOMI. Head: Normal except for the redness and swelling as I described in HPI Nose: No congestion/rhinnorhea. Mouth/Throat: Mucous membranes are moist.  Oropharynx non-erythematous. Neck: No stridor.  No cervical spine Hematological/Lymphatic/Immunilogical: No cervical lymphadenopathy. Cardiovascular: Normal rate, regular rhythm. Grossly normal heart sounds.  Good peripheral circulation. Respiratory: Normal respiratory effort.  No retractions. Lungs CTAB. Gastrointestinal: Soft and nontender. No distention. No abdominal bruits. No CVA tenderness. Musculoskeletal: No lower extremity tenderness nor edema.  No joint effusions. Neurologic:  Normal speech and language. No gross focal neurologic deficits are appreciated. Skin:   Skin is warm, dry and intact Psychiatric: Mood and affect are normal. Speech and behavior are normal.  ____________________________________________   LABS (all labs ordered are listed, but only abnormal results are displayed)  Labs Reviewed  CBC - Abnormal; Notable for the following components:      Result Value   WBC 12.6 (*)    All other components within normal limits  BASIC METABOLIC PANEL - Abnormal; Notable for the following components:   Potassium 3.4 (*)    Chloride 100 (*)    Glucose, Bld 140 (*)    All other components within normal limits  CULTURE, BLOOD (ROUTINE X 2)  CULTURE, BLOOD (ROUTINE X 2)  LACTIC ACID, PLASMA  LACTIC ACID, PLASMA   ____________________________________________  EKG   ____________________________________________  RADIOLOGY  ED MD interpretation: CT shows preseptal cellulitis and what appears to be a meningioma.  I reviewed the films  Official radiology report(s): Ct Maxillofacial W Contrast  Result Date: 08/12/2017 CLINICAL DATA:  LEFT side of face swelling.  M54 EXAM: CT MAXILLOFACIAL WITH CONTRAST TECHNIQUE: Multidetector CT imaging of the maxillofacial structures was performed with intravenous contrast. Multiplanar CT image reconstructions were also generated. CONTRAST:  20mL ISOVUE-370 IOPAMIDOL (ISOVUE-370) INJECTION 76% COMPARISON:  CT maxillofacial 02/02/2016. FINDINGS: Osseous: No fracture or mandibular dislocation. No destructive process. LEFT mandibular molar root canal, appears uncomplicated. LEFT mandibular premolar dental caries, similar to priors. Hounsfield artifact from previous dental amalgam. Orbits: LEFT preseptal periorbital soft tissue swelling. No postseptal inflammation. Globes are symmetric. Sinuses: Minor maxillary, ethmoid, and sphenoid sinus disease. No significant opacity or layering fluid. Soft tissues: Cellulitic change over the LEFT malar soft tissues, involving the masticator and buccinator spaces, extends  inferomedially to the submandibular space. No concern for Ludwig's angina. Subcutaneous skin thickening without visible foreign body or drainable abscess. No visible parotid or submandibular stones. Limited intracranial: There is a 16 x 22 mm RIGHT frontal intracranial mass lesion, incompletely evaluated, but suspected to be a meningioma. Compared with priors, a similar appearance was noted. IMPRESSION: Multispatial LEFT facial cellulitis, without visible foreign body or drainable abscess. LEFT mandibular premolar dental caries, not changed from 2017. No periapical lucency or significant paranasal sinus disease. 16 x 22 mm intracranial mass lesion, suspected meningioma. MRI of the brain without and with contrast is recommended for further evaluation. Electronically Signed   By: Staci Righter M.D.   On: 08/12/2017 11:49    ____________________________________________   PROCEDURES  Procedure(s) performed:   Procedures  Critical Care performed:  critical care time 10 minutes this includes talking to the hospitalist and explained to the patient was going on. ____________________________________________   INITIAL IMPRESSION / [place] / ED COURSE  Patient is failed outpatient management with Bactrim.  She is gotten much worse since that was started yesterday.  Plan on admitting her for IV antibiotic therapy and MRI of the head to make sure that the object noted actually is a meningioma.  Patient is gotten 2 doses of IV Dilaudid with Zofran we will give her some Percocet now so maybe it will last longer.  MRI cannot be done until 3 PM.     ____________________________________________   FINAL CLINICAL IMPRESSION(S) / ED DIAGNOSES  Final diagnoses:  Preseptal cellulitis     ED Discharge Orders    None       Note:  This document was prepared using Dragon voice recognition software and may include unintentional dictation errors.    Nena Polio, MD 08/12/17 1159      Nena Polio, MD 08/12/17 1351    Nena Polio, MD 08/26/17 0630

## 2017-08-12 NOTE — ED Notes (Signed)
Patient transported to CT 

## 2017-08-12 NOTE — ED Notes (Signed)
Pt transported to MRI, pt given Ativan prior to transporting for claustrophobia.

## 2017-08-12 NOTE — Progress Notes (Signed)
Primary nurse assessed pt and visualized rash on upper chest. Pt stated that she takes Hydroxyzine 50 mg 2 times daily.  Primary nurse paged and spoke to Dr. Duane Boston.Orders received for Hydroxyzine 50 mg 2 times daily. Primary nurse to continue to monitor.

## 2017-08-12 NOTE — ED Notes (Addendum)
Carol Mooney, EDT to take patient to [id number]-[id number]

## 2017-08-12 NOTE — ED Notes (Signed)
Pt requesting to take something before having MRI to keep her relaxed.

## 2017-08-12 NOTE — ED Triage Notes (Signed)
L facial swelling began yesterday, now markedly increased. States went to doctor yesterday and started on bactrim and norco.

## 2017-08-12 NOTE — H&P (Signed)
[facility] NAME: Carol Mooney    MR#:  [id number]  DATE OF BIRTH:  08/10/1975  DATE OF ADMISSION:  08/12/2017  PRIMARY CARE PHYSICIAN: Carmon Ginsberg, PA   REQUESTING/REFERRING PHYSICIAN: Quale  CHIEF COMPLAINT:   Chief Complaint  Patient presents with  . Facial Swelling    HISTORY OF PRESENT ILLNESS: Carol Mooney  is a 42 y.o. female with a known history of Anxiety, Depression- Came with facial swelling on left side - yesterday was given oral bactrim from [facility] clinic, but it is getting worse , swelling up to her eye, so came to ER. Noted Localized collectionon CT and MRI. Incidental finding of meningioma on Frontal lobe, now complains- her vision and eye glasses had changed over last one year, no focal symptoms or headache.  PAST MEDICAL HISTORY:   Past Medical History:  Diagnosis Date  . Allergy   . Anxiety   . Complication of anesthesia    nausea and vomiting  . Depression   . PONV (postoperative nausea and vomiting)     PAST SURGICAL HISTORY:  Past Surgical History:  Procedure Laterality Date  . c sections    . HYSTERECTOMY ABDOMINAL WITH SALPINGECTOMY Bilateral 12/07/2015   Procedure: HYSTERECTOMY ABDOMINAL WITH SALPINGECTOMY/EXCISION OF ENDOMETRIOS;  Surgeon: Boykin Nearing, MD;  Location: [facility] ORS;  Service: Gynecology;  Laterality: Bilateral;  . OOPHORECTOMY Bilateral 12/07/2015   Procedure: OOPHORECTOMY;  Surgeon: Boykin Nearing, MD;  Location: [facility] ORS;  Service: Gynecology;  Laterality: Bilateral;  . TUBAL LIGATION  2000  . UTERINE STENT PLACEMENT Bilateral 12/07/2015   Procedure: UTERINE STENT PLACEMENT/REMOVAL;  Surgeon: Boykin Nearing, MD;  Location: [facility] ORS;  Service: Gynecology;  Laterality: Bilateral;    SOCIAL HISTORY:  Social History   Tobacco Use  . Smoking status: Current Every Day Smoker    Packs/day: 0.50    Types: Cigarettes    Last attempt to quit: 05/19/2013    Years since  quitting: 4.2  . Smokeless tobacco: Never Used  Substance Use Topics  . Alcohol use: Yes    Alcohol/week: 1.2 oz    Types: 2 Shots of liquor per week    Comment: very rarely    FAMILY HISTORY:  Family History  Problem Relation Age of Onset  . Coronary artery disease Father   . Depression Father   . Diabetes Father        insulin dependent  . Depression Brother   . Cancer Maternal Grandmother        breast  . Breast cancer Maternal Grandmother 43  . Depression Sister   . Breast cancer Paternal Grandmother 82    DRUG ALLERGIES:  Allergies  Allergen Reactions  . Morphine Itching  . Morphine And Related Itching    REVIEW OF SYSTEMS:   CONSTITUTIONAL: No fever, fatigue or weakness.  EYES: No blurred or double vision.  EARS, NOSE, AND THROAT: No tinnitus or ear pain.  RESPIRATORY: No cough, shortness of breath, wheezing or hemoptysis.  CARDIOVASCULAR: No chest pain, orthopnea, edema.  GASTROINTESTINAL: No nausea, vomiting, diarrhea or abdominal pain.  GENITOURINARY: No dysuria, hematuria.  ENDOCRINE: No polyuria, nocturia,  HEMATOLOGY: No anemia, easy bruising or bleeding SKIN: No rash or lesion. MUSCULOSKELETAL: No joint pain or arthritis.   NEUROLOGIC: No tingling, numbness, weakness.  PSYCHIATRY: No anxiety or depression.   MEDICATIONS AT HOME:  Prior to Admission medications   Medication Sig Start Date End Date Taking? Authorizing Provider  Calcium  Carb-Cholecalciferol (CALCIUM + D3) 600-200 MG-UNIT TABS Take 1 tablet by mouth daily.   Yes [provider]  DULoxetine (CYMBALTA) 30 MG capsule Take 1 capsule by mouth daily. 07/31/17  Yes [provider]  HYDROcodone-acetaminophen (NORCO/VICODIN) 5-325 MG tablet Take 1 tablet by mouth every 6 (six) hours as needed for moderate pain.   Yes [provider]  levonorgestrel-ethinyl estradiol (SEASONALE,INTROVALE,JOLESSA) 0.15-0.03 MG tablet Take 1 tablet by mouth daily.   Yes [provider]  sulfamethoxazole-trimethoprim (BACTRIM DS,SEPTRA DS) 800-160 MG tablet Take 1 tablet by mouth 2 (two) times daily. 08/11/17 08/18/17 Yes [provider]  valACYclovir (VALTREX) 1000 MG tablet TAKE 2 TABLETS BY MOUTH EVERY 12 HOURS FOR 1 DAY AT ONSET OF COLD SORE 06/29/17  Yes Carmon Ginsberg, PA  acetaminophen (TYLENOL) 325 MG tablet Take 650 mg by mouth every 6 (six) hours as needed.    [provider]  polyethylene glycol (MIRALAX / GLYCOLAX) packet Take 17 g by mouth daily.    [provider]  senna (SENOKOT) 8.6 MG TABS tablet Take 1 tablet by mouth daily as needed for mild constipation.    [provider]      PHYSICAL EXAMINATION:   VITAL SIGNS: Blood pressure 123/83, pulse 88, temperature 98.6 F (37 C), temperature source Oral, resp. rate 18, height 5\' 5"  (1.651 m), weight 73 kg (161 lb), last menstrual period 10/11/2015, SpO2 99 %.  GENERAL:  42 y.o.-year-old patient lying in the bed with no acute distress.  EYES: Pupils equal, round, reactive to light and accommodation. No scleral icterus. Extraocular muscles intact.  HEENT: Head atraumatic, normocephalic. Oropharynx and nasopharynx clear. Left face swelling and redness extending from Lower eyelid to her mouth. NECK:  Supple, no jugular venous distention. No thyroid enlargement, no tenderness.  LUNGS: Normal breath sounds bilaterally, no wheezing, rales,rhonchi or crepitation. No use of accessory muscles of respiration.  CARDIOVASCULAR: S1, S2 normal. No murmurs, rubs, or gallops.  ABDOMEN: Soft, nontender, nondistended. Bowel sounds present. No organomegaly or mass.  EXTREMITIES: No pedal edema, cyanosis, or clubbing.  NEUROLOGIC: Cranial nerves II through XII are intact. Muscle strength 5/5 in all extremities. Sensation intact. Gait not checked.  PSYCHIATRIC: The patient is alert and oriented x 3.  SKIN: No obvious rash, lesion, or ulcer.   LABORATORY PANEL:   CBC Recent Labs  Lab  08/12/17 1027  WBC 12.6*  HGB 14.7  HCT 43.1  PLT 299  MCV 98.2  MCH 33.5  MCHC 34.1  RDW 13.3   ------------------------------------------------------------------------------------------------------------------  Chemistries  Recent Labs  Lab 08/12/17 1027  NA 137  K 3.4*  CL 100*  CO2 27  GLUCOSE 140*  BUN 10  CREATININE 0.80  CALCIUM 9.4   ------------------------------------------------------------------------------------------------------------------ estimated creatinine clearance is 91.7 mL/min (by C-G formula based on SCr of 0.8 mg/dL). ------------------------------------------------------------------------------------------------------------------ No results for input(s): TSH, T4TOTAL, T3FREE, THYROIDAB in the last 72 hours.  Invalid input(s): FREET3   Coagulation profile No results for input(s): INR, PROTIME in the last 168 hours. ------------------------------------------------------------------------------------------------------------------- No results for input(s): DDIMER in the last 72 hours. -------------------------------------------------------------------------------------------------------------------  Cardiac Enzymes No results for input(s): CKMB, TROPONINI, MYOGLOBIN in the last 168 hours.  Invalid input(s): CK ------------------------------------------------------------------------------------------------------------------ Invalid input(s): POCBNP  ---------------------------------------------------------------------------------------------------------------  Urinalysis    Component Value Date/Time   COLORURINE YELLOW (A) 01/02/2016 2014   APPEARANCEUR CLEAR (A) 01/02/2016 2014   LABSPEC 1.020 01/02/2016 2014   PHURINE 5.0 01/02/2016 2014   GLUCOSEU NEGATIVE 01/02/2016 2014   HGBUR NEGATIVE 01/02/2016  2014   [place] NEGATIVE 01/02/2016 2014   BILIRUBINUR negative 10/13/2014 1500   KETONESUR NEGATIVE 01/02/2016 2014   PROTEINUR  NEGATIVE 01/02/2016 2014   UROBILINOGEN 1.0 10/13/2014 1500   NITRITE NEGATIVE 01/02/2016 2014   LEUKOCYTESUR NEGATIVE 01/02/2016 2014     RADIOLOGY: Mr Brain W And Wo Contrast  Result Date: 08/12/2017 CLINICAL DATA:  Headache.  Facial cellulitis. EXAM: MRI HEAD WITHOUT AND WITH CONTRAST TECHNIQUE: Multiplanar, multiecho pulse sequences of the brain and surrounding structures were obtained without and with intravenous contrast. CONTRAST:  78mL MULTIHANCE GADOBENATE DIMEGLUMINE 529 MG/ML IV SOLN COMPARISON:  CT maxillofacial earlier today. FINDINGS: Brain: No acute stroke, hemorrhage, hydrocephalus, or extra-axial fluid. Mild atrophy. No significant white matter disease. Post infusion, there is evidence for a solid, avidly enhancing, extra-axial mass, in the anterior cranial fossa, with attachment both to the dura posterior to the frontal sinus as well as to the falx. Measurements are 21 x 17 x 22 mm. Imaging characteristics are classic for meningioma. There is no regional vasogenic edema. There could be possible ingrowth into the superior sagittal sinus, but this would be of little clinical consequence, as the sinus is so small at this location. There has been interval growth since 2017. Vascular: Normal flow voids. Skull and upper cervical spine: Normal marrow signal. Sinuses/Orbits: Negative. Other: In the LEFT malar region, there is an approximate 9 x 13 mm focus of restricted diffusion, of concern for focal phlegmon, ill-defined margins, but consistent with developing abscess. This area is poorly visualized on CT, in part due to streak artifact. This collection is also visualized on post infusion imaging, most clearly on series 12, image 9, and series 13, image 29. IMPRESSION: Post infusion brain imaging findings consistent with a incidental meningioma, 21 x 17 x 22 mm, LEFT anterior frontal and parasagittal. Interval growth since 2017. Elective neurosurgical consultation is warranted. LEFT facial  cellulitis more optimally evaluated on MR than CT, with a central focus of restricted diffusion and ill-defined fluid collection in the LEFT malar region consistent with phlegmon and developing abscess. Findings discussed with ordering provider. Electronically Signed   By: Staci Righter M.D.   On: 08/12/2017 15:56   Ct Maxillofacial W Contrast  Result Date: 08/12/2017 CLINICAL DATA:  LEFT side of face swelling.  M54 EXAM: CT MAXILLOFACIAL WITH CONTRAST TECHNIQUE: Multidetector CT imaging of the maxillofacial structures was performed with intravenous contrast. Multiplanar CT image reconstructions were also generated. CONTRAST:  96mL ISOVUE-370 IOPAMIDOL (ISOVUE-370) INJECTION 76% COMPARISON:  CT maxillofacial 02/02/2016. FINDINGS: Osseous: No fracture or mandibular dislocation. No destructive process. LEFT mandibular molar root canal, appears uncomplicated. LEFT mandibular premolar dental caries, similar to priors. Hounsfield artifact from previous dental amalgam. Orbits: LEFT preseptal periorbital soft tissue swelling. No postseptal inflammation. Globes are symmetric. Sinuses: Minor maxillary, ethmoid, and sphenoid sinus disease. No significant opacity or layering fluid. Soft tissues: Cellulitic change over the LEFT malar soft tissues, involving the masticator and buccinator spaces, extends inferomedially to the submandibular space. No concern for Ludwig's angina. Subcutaneous skin thickening without visible foreign body or drainable abscess. No visible parotid or submandibular stones. Limited intracranial: There is a 16 x 22 mm RIGHT frontal intracranial mass lesion, incompletely evaluated, but suspected to be a meningioma. Compared with priors, a similar appearance was noted. IMPRESSION: Multispatial LEFT facial cellulitis, without visible foreign body or drainable abscess. LEFT mandibular premolar dental caries, not changed from 2017. No periapical lucency or significant paranasal sinus disease. 16 x 22 mm  intracranial mass lesion, suspected  meningioma. MRI of the brain without and with contrast is recommended for further evaluation. Electronically Signed   By: Staci Righter M.D.   On: 08/12/2017 11:49    EKG: Orders placed or performed during the hospital encounter of 12/07/14  . ED EKG within 10 minutes  . ED EKG within 10 minutes  . EKG    IMPRESSION AND PLAN:  * Facial cellulitis   Localized collection as per CT and MRI.   IV clindamycine   ENT consult.   Pain control PRN.  * Meningioma   Incidental finding, Vision changes in last year   Neurology consult.  * Smoking    Tobacco cessation counseling done by me for 4 min, will give nicotine patch.  * Anxiety and depression    Cont home meds.  All the records are reviewed and case discussed with ED provider. Management plans discussed with the patient, family and they are in agreement.  CODE STATUS: Full. Code Status History    Date Active Date Inactive Code Status Order ID Comments User Context   02/03/2016 0121 02/04/2016 1328 Full Code [id number]  Harvie Bridge, DO ED   12/07/2015 1105 12/10/2015 1242 Full Code [id number]  Schermerhorn, Gwen Her, MD Inpatient     Her mother is present in room during my visit.  TOTAL TIME TAKING CARE OF THIS PATIENT: 45 minutes.    Vaughan Basta M.D on 08/12/2017   Between 7am to 6pm - Pager - [phone]  After 6pm go to [email or web address] - password [email or web address] [facility]  Office  [phone]  CC: Primary care physician; Carmon Ginsberg, PA   Note: This dictation was prepared with Dragon dictation along with smaller phrase technology. Any transcriptional errors that result from this process are unintentional.

## 2017-08-13 DIAGNOSIS — L03211 Cellulitis of face: Principal | ICD-10-CM

## 2017-08-13 LAB — BASIC METABOLIC PANEL
Anion gap: 5 (ref 5–15)
BUN: 9 mg/dL (ref 6–20)
CO2: 31 mmol/L (ref 22–32)
Calcium: 8.6 mg/dL — ABNORMAL LOW (ref 8.9–10.3)
Chloride: 101 mmol/L (ref 101–111)
Creatinine, Ser: 0.63 mg/dL (ref 0.44–1.00)
GFR calc Af Amer: >60 mL/min (ref >60)
GFR calc non Af Amer: >60 mL/min (ref >60)
Glucose, Bld: 153 mg/dL — ABNORMAL HIGH (ref 65–99)
Potassium: 3.8 mmol/L (ref 3.5–5.1)
Sodium: 137 mmol/L (ref 135–145)

## 2017-08-13 LAB — CBC
HCT: 40.3 % (ref 35.0–47.0)
Hemoglobin: 13.5 g/dL (ref 12.0–16.0)
MCH: 33.4 pg (ref 26.0–34.0)
MCHC: 33.6 g/dL (ref 32.0–36.0)
MCV: 99.5 fL (ref 80.0–100.0)
Platelets: 265 10*3/uL (ref 150–440)
RBC: 4.05 MIL/uL (ref 3.80–5.20)
RDW: 13.3 % (ref 11.5–14.5)
WBC: 9 10*3/uL (ref 3.6–11.0)

## 2017-08-13 LAB — HEMOGLOBIN A1C
Hgb A1c MFr Bld: 5.7 % — ABNORMAL HIGH (ref 4.8–5.6)
Mean Plasma Glucose: 116.89 mg/dL

## 2017-08-13 MED ORDER — METHYLPREDNISOLONE SODIUM SUCC 40 MG IJ SOLR
40.0000 mg | Freq: Two times a day (BID) | INTRAMUSCULAR | Status: DC
  Administered 2017-08-13 – 2017-08-15 (×5): 40 mg via INTRAVENOUS
  Filled 2017-08-13 (×5): qty 1

## 2017-08-13 MED ORDER — OXYCODONE HCL 5 MG PO TABS
10.0000 mg | ORAL_TABLET | ORAL | Status: DC | PRN
  Administered 2017-08-13 – 2017-08-15 (×9): 10 mg via ORAL
  Filled 2017-08-13 (×9): qty 2

## 2017-08-13 NOTE — Consult Note (Signed)
Reason for Consult: meningioma  Referring Physician: Dr. Leslye Peer   CC: meningioma   HPI: Carol Mooney is an 42 y.o. female with a known history of Anxiety, Depression- Came with facial swelling on left sidNoted Localized collectionon CT and MRI. Incidental finding of meningioma on Frontal lobe.      Past Medical History:  Diagnosis Date  . Allergy   . Anxiety   . Complication of anesthesia    nausea and vomiting  . Depression   . PONV (postoperative nausea and vomiting)     Past Surgical History:  Procedure Laterality Date  . c sections    . HYSTERECTOMY ABDOMINAL WITH SALPINGECTOMY Bilateral 12/07/2015   Procedure: HYSTERECTOMY ABDOMINAL WITH SALPINGECTOMY/EXCISION OF ENDOMETRIOS;  Surgeon: Boykin Nearing, MD;  Location: [facility];  Service: Gynecology;  Laterality: Bilateral;  . OOPHORECTOMY Bilateral 12/07/2015   Procedure: OOPHORECTOMY;  Surgeon: Boykin Nearing, MD;  Location: [facility];  Service: Gynecology;  Laterality: Bilateral;  . TUBAL LIGATION  2000  . UTERINE STENT PLACEMENT Bilateral 12/07/2015   Procedure: UTERINE STENT PLACEMENT/REMOVAL;  Surgeon: Boykin Nearing, MD;  Location: [facility];  Service: Gynecology;  Laterality: Bilateral;    Family History  Problem Relation Age of Onset  . Coronary artery disease Father   . Depression Father   . Diabetes Father        insulin dependent  . Depression Brother   . Cancer Maternal Grandmother        breast  . Breast cancer Maternal Grandmother 46  . Depression Sister   . Breast cancer Paternal Grandmother 45    Social History:  reports that she has been smoking cigarettes.  She has been smoking about 0.50 packs per day. She has never used smokeless tobacco. She reports that she drinks about 1.2 oz of alcohol per week. She reports that she does not use drugs.  Allergies  Allergen Reactions  . Morphine Itching  . Morphine And Related Itching    Medications: I have reviewed the patient's  current medications.  ROS: History obtained from the patient  General ROS: negative for - chills, fatigue, fever, night sweats, weight gain or weight loss Psychological ROS: negative for - behavioral disorder, hallucinations, memory difficulties, mood swings or suicidal ideation Ophthalmic ROS: negative for - blurry vision, double vision, eye pain or loss of vision ENT ROS: negative for - epistaxis, nasal discharge, oral lesions, sore throat, tinnitus or vertigo Allergy and Immunology ROS: negative for - hives or itchy/watery eyes Hematological and Lymphatic ROS: negative for - bleeding problems, bruising or swollen lymph nodes Endocrine ROS: negative for - galactorrhea, hair pattern changes, polydipsia/polyuria or temperature intolerance Respiratory ROS: negative for - cough, hemoptysis, shortness of breath or wheezing Cardiovascular ROS: negative for - chest pain, dyspnea on exertion, edema or irregular heartbeat Gastrointestinal ROS: negative for - abdominal pain, diarrhea, hematemesis, nausea/vomiting or stool incontinence Genito-Urinary ROS: negative for - dysuria, hematuria, incontinence or urinary frequency/urgency Musculoskeletal ROS: negative for - joint swelling or muscular weakness Neurological ROS: as noted in HPI Dermatological ROS: negative for rash and skin lesion changes  Physical Examination: Blood pressure 106/68, pulse 86, temperature (!) 97.5 F (36.4 C), temperature source Oral, resp. rate 18, height 5\' 5"  (1.651 m), weight 161 lb (73 kg), last menstrual period 10/11/2015, SpO2 94 %.    Neurological Examination   Mental Status: Alert, oriented, thought content appropriate.  Speech fluent without evidence of aphasia.  Able to follow 3 step commands without difficulty. Cranial Nerves:  II: Discs flat bilaterally; Visual fields grossly normal, pupils equal, round, reactive to light and accommodation III,IV, VI: ptosis not present, extra-ocular motions intact  bilaterally V,VII: smile symmetric, facial light touch sensation normal bilaterally VIII: hearing normal bilaterally IX,X: gag reflex present XI: bilateral shoulder shrug XII: midline tongue extension Motor: Right : Upper extremity   5/5    Left:     Upper extremity   5/5  Lower extremity   5/5     Lower extremity   5/5 Tone and bulk:normal tone throughout; no atrophy noted Sensory: Pinprick and light touch intact throughout, bilaterally Deep Tendon Reflexes: 2+ and symmetric throughout Plantars: Right: downgoing   Left: downgoing Cerebellar: normal finger-to-nose, normal rapid alternating movements and normal heel-to-shin test Gait: not tested      Laboratory Studies:   Basic Metabolic Panel: Recent Labs  Lab 08/12/17 1027 08/13/17 0341  NA 137 137  K 3.4* 3.8  CL 100* 101  CO2 27 31  GLUCOSE 140* 153*  BUN 10 9  CREATININE 0.80 0.63  CALCIUM 9.4 8.6*    Liver Function Tests: No results for input(s): AST, ALT, ALKPHOS, BILITOT, PROT, ALBUMIN in the last 168 hours. No results for input(s): LIPASE, AMYLASE in the last 168 hours. No results for input(s): AMMONIA in the last 168 hours.  CBC: Recent Labs  Lab 08/12/17 1027 08/13/17 0341  WBC 12.6* 9.0  HGB 14.7 13.5  HCT 43.1 40.3  MCV 98.2 99.5  PLT 299 265    Cardiac Enzymes: No results for input(s): CKTOTAL, CKMB, CKMBINDEX, TROPONINI in the last 168 hours.  BNP: Invalid input(s): POCBNP  CBG: No results for input(s): GLUCAP in the last 168 hours.  Microbiology: Results for orders placed or performed during the hospital encounter of 08/12/17  Blood culture (routine x 2)     Status: None (Preliminary result)   Collection Time: 08/12/17 10:15 AM  Result Value Ref Range Status   Specimen Description BLOOD R FAl  Final   Special Requests   Final    BOTTLES DRAWN AEROBIC AND ANAEROBIC Blood Culture adequate volume   Culture   Final    NO GROWTH < 24 HOURS Performed at [facility], [address], [place], [place] [address]    Report Status PENDING  Incomplete  Blood culture (routine x 2)     Status: None (Preliminary result)   Collection Time: 08/12/17 10:25 AM  Result Value Ref Range Status   Specimen Description BLOOD L FA  Final   Special Requests   Final    BOTTLES DRAWN AEROBIC AND ANAEROBIC Blood Culture results may not be optimal due to an excessive volume of blood received in culture bottles   Culture   Final    NO GROWTH < 24 HOURS Performed at [facility], [address] [place], [place] [address]    Report Status PENDING  Incomplete    Coagulation Studies: No results for input(s): LABPROT, INR in the last 72 hours.  Urinalysis: No results for input(s): COLORURINE, LABSPEC, PHURINE, GLUCOSEU, HGBUR, BILIRUBINUR, KETONESUR, PROTEINUR, UROBILINOGEN, NITRITE, LEUKOCYTESUR in the last 168 hours.  Invalid input(s): APPERANCEUR  Lipid Panel:     Component Value Date/Time   CHOL 169 05/17/2017 1043   TRIG 112 05/17/2017 1043   HDL 71 05/17/2017 1043   CHOLHDL 2.4 05/17/2017 1043   LDLCALC 76 05/17/2017 1043    HgbA1C: No results found for: HGBA1C  Urine Drug Screen:  No results found for: LABOPIA, COCAINSCRNUR, LABBENZ,  AMPHETMU, THCU, LABBARB  Alcohol Level: No results for input(s): ETH in the last 168 hours.   Imaging: Mr Jeri Cos And Wo Contrast  Result Date: 08/12/2017 CLINICAL DATA:  Headache.  Facial cellulitis. EXAM: MRI HEAD WITHOUT AND WITH CONTRAST TECHNIQUE: Multiplanar, multiecho pulse sequences of the brain and surrounding structures were obtained without and with intravenous contrast. CONTRAST:  53mL MULTIHANCE GADOBENATE DIMEGLUMINE 529 MG/ML IV SOLN COMPARISON:  CT maxillofacial earlier today. FINDINGS: Brain: No acute stroke, hemorrhage, hydrocephalus, or extra-axial fluid. Mild atrophy. No significant white matter disease. Post infusion, there is evidence for a solid, avidly enhancing, extra-axial mass, in the anterior  cranial fossa, with attachment both to the dura posterior to the frontal sinus as well as to the falx. Measurements are 21 x 17 x 22 mm. Imaging characteristics are classic for meningioma. There is no regional vasogenic edema. There could be possible ingrowth into the superior sagittal sinus, but this would be of little clinical consequence, as the sinus is so small at this location. There has been interval growth since 2017. Vascular: Normal flow voids. Skull and upper cervical spine: Normal marrow signal. Sinuses/Orbits: Negative. Other: In the LEFT malar region, there is an approximate 9 x 13 mm focus of restricted diffusion, of concern for focal phlegmon, ill-defined margins, but consistent with developing abscess. This area is poorly visualized on CT, in part due to streak artifact. This collection is also visualized on post infusion imaging, most clearly on series 12, image 9, and series 13, image 29. IMPRESSION: Post infusion brain imaging findings consistent with a incidental meningioma, 21 x 17 x 22 mm, LEFT anterior frontal and parasagittal. Interval growth since 2017. Elective neurosurgical consultation is warranted. LEFT facial cellulitis more optimally evaluated on MR than CT, with a central focus of restricted diffusion and ill-defined fluid collection in the LEFT malar region consistent with phlegmon and developing abscess. Findings discussed with ordering provider. Electronically Signed   By: Staci Righter M.D.   On: 08/12/2017 15:56   Ct Maxillofacial W Contrast  Result Date: 08/12/2017 CLINICAL DATA:  LEFT side of face swelling.  M54 EXAM: CT MAXILLOFACIAL WITH CONTRAST TECHNIQUE: Multidetector CT imaging of the maxillofacial structures was performed with intravenous contrast. Multiplanar CT image reconstructions were also generated. CONTRAST:  21mL ISOVUE-370 IOPAMIDOL (ISOVUE-370) INJECTION 76% COMPARISON:  CT maxillofacial 02/02/2016. FINDINGS: Osseous: No fracture or mandibular dislocation.  No destructive process. LEFT mandibular molar root canal, appears uncomplicated. LEFT mandibular premolar dental caries, similar to priors. Hounsfield artifact from previous dental amalgam. Orbits: LEFT preseptal periorbital soft tissue swelling. No postseptal inflammation. Globes are symmetric. Sinuses: Minor maxillary, ethmoid, and sphenoid sinus disease. No significant opacity or layering fluid. Soft tissues: Cellulitic change over the LEFT malar soft tissues, involving the masticator and buccinator spaces, extends inferomedially to the submandibular space. No concern for Ludwig's angina. Subcutaneous skin thickening without visible foreign body or drainable abscess. No visible parotid or submandibular stones. Limited intracranial: There is a 16 x 22 mm RIGHT frontal intracranial mass lesion, incompletely evaluated, but suspected to be a meningioma. Compared with priors, a similar appearance was noted. IMPRESSION: Multispatial LEFT facial cellulitis, without visible foreign body or drainable abscess. LEFT mandibular premolar dental caries, not changed from 2017. No periapical lucency or significant paranasal sinus disease. 16 x 22 mm intracranial mass lesion, suspected meningioma. MRI of the brain without and with contrast is recommended for further evaluation. Electronically Signed   By: Staci Righter M.D.   On: 08/12/2017 11:49  Assessment/Plan: 42 y.o. female with a known history of Anxiety, Depression- Came with facial swelling on left sidNoted Localized collectionon CT and MRI. Incidental finding of meningioma on Frontal lobe.  Meningioma - Incidental finding  - I don't think its contributing to her poor vision progression  - follow up imagine in 6 month to year  Facial Cellulitis - Being treated by primary team  D/c planning from Neuro stand point  08/13/2017, 11:08 AM

## 2017-08-13 NOTE — Progress Notes (Signed)
Patient ID: Carol Mooney, female   DOB: Mar 26, 1976, 42 y.o.   MRN: [id number]  [employer] PROGRESS NOTE  WILLADEEN COLANTUONO CBJ:[id number] DOB: 08/06/1975 DOA: 08/12/2017 PCP: Carmon Ginsberg, PA  HPI/Subjective: Patient feeling quite a bit of pain in her left face.  Started off as like a pimple on Thursday.  Friday and spread to the lower eyelid and she went to the [facility] and was started on Bactrim.  On Saturday she could not open her eye.  Now having pain 8 out of 10 intensity.  States it is about the same as yesterday at this point.  Objective: Vitals:   08/13/17 0511 08/13/17 1215  BP: 106/68 114/82  Pulse: 86 78  Resp: 18 19  Temp: (!) 97.5 F (36.4 C) 97.6 F (36.4 C)  SpO2: 94% 100%    Filed Weights   08/12/17 0947  Weight: 73 kg (161 lb)    ROS: Review of Systems  Constitutional: Negative for chills and fever.  Eyes: Negative for blurred vision.  Respiratory: Negative for cough and shortness of breath.   Cardiovascular: Negative for chest pain.  Gastrointestinal: Negative for abdominal pain, constipation, diarrhea, nausea and vomiting.  Genitourinary: Negative for dysuria.  Musculoskeletal: Negative for joint pain.  Neurological: Negative for dizziness and headaches.   Exam: Physical Exam  HENT:  Nose: No mucosal edema.  Mouth/Throat: No oropharyngeal exudate or posterior oropharyngeal edema.  Eyes: Pupils are equal, round, and reactive to light. Conjunctivae, EOM and lids are normal.  Neck: No JVD present. Carotid bruit is not present. No edema present. No thyroid mass and no thyromegaly present.  Cardiovascular: S1 normal and S2 normal. Exam reveals no gallop.  No murmur heard. Pulses:      Dorsalis pedis pulses are 2+ on the right side, and 2+ on the left side.  Respiratory: No respiratory distress. She has no wheezes. She has no rhonchi. She has no rales.  GI: Soft. Bowel sounds are normal. There is no tenderness.  Musculoskeletal:       Right  shoulder: She exhibits no swelling.  Lymphadenopathy:    She has no cervical adenopathy.  Neurological: She is alert. No cranial nerve deficit.  Skin: Skin is warm. Nails show no clubbing.  Left facial fullness and erythema.  Tenderness to palpation.  Psychiatric: She has a normal mood and affect.      Data Reviewed: Basic Metabolic Panel: Recent Labs  Lab 08/12/17 1027 08/13/17 0341  NA 137 137  K 3.4* 3.8  CL 100* 101  CO2 27 31  GLUCOSE 140* 153*  BUN 10 9  CREATININE 0.80 0.63  CALCIUM 9.4 8.6*   CBC: Recent Labs  Lab 08/12/17 1027 08/13/17 0341  WBC 12.6* 9.0  HGB 14.7 13.5  HCT 43.1 40.3  MCV 98.2 99.5  PLT 299 265     Recent Results (from the past 240 hour(s))  Blood culture (routine x 2)     Status: None (Preliminary result)   Collection Time: 08/12/17 10:15 AM  Result Value Ref Range Status   Specimen Description BLOOD R FAl  Final   Special Requests   Final    BOTTLES DRAWN AEROBIC AND ANAEROBIC Blood Culture adequate volume   Culture   Final    NO GROWTH < 24 HOURS Performed at [facility], [address] [place], [place] [address]    Report Status PENDING  Incomplete  Blood culture (routine x 2)     Status: None (Preliminary result)  Collection Time: 08/12/17 10:25 AM  Result Value Ref Range Status   Specimen Description BLOOD L FA  Final   Special Requests   Final    BOTTLES DRAWN AEROBIC AND ANAEROBIC Blood Culture results may not be optimal due to an excessive volume of blood received in culture bottles   Culture   Final    NO GROWTH < 24 HOURS Performed at [facility], [address] [place], [place] [address]    Report Status PENDING  Incomplete     Studies: Mr Jeri Cos And Wo Contrast  Result Date: 08/12/2017 CLINICAL DATA:  Headache.  Facial cellulitis. EXAM: MRI HEAD WITHOUT AND WITH CONTRAST TECHNIQUE: Multiplanar, multiecho pulse sequences of the brain and surrounding structures were obtained without and  with intravenous contrast. CONTRAST:  17mL MULTIHANCE GADOBENATE DIMEGLUMINE 529 MG/ML IV SOLN COMPARISON:  CT maxillofacial earlier today. FINDINGS: Brain: No acute stroke, hemorrhage, hydrocephalus, or extra-axial fluid. Mild atrophy. No significant white matter disease. Post infusion, there is evidence for a solid, avidly enhancing, extra-axial mass, in the anterior cranial fossa, with attachment both to the dura posterior to the frontal sinus as well as to the falx. Measurements are 21 x 17 x 22 mm. Imaging characteristics are classic for meningioma. There is no regional vasogenic edema. There could be possible ingrowth into the superior sagittal sinus, but this would be of little clinical consequence, as the sinus is so small at this location. There has been interval growth since 2017. Vascular: Normal flow voids. Skull and upper cervical spine: Normal marrow signal. Sinuses/Orbits: Negative. Other: In the LEFT malar region, there is an approximate 9 x 13 mm focus of restricted diffusion, of concern for focal phlegmon, ill-defined margins, but consistent with developing abscess. This area is poorly visualized on CT, in part due to streak artifact. This collection is also visualized on post infusion imaging, most clearly on series 12, image 9, and series 13, image 29. IMPRESSION: Post infusion brain imaging findings consistent with a incidental meningioma, 21 x 17 x 22 mm, LEFT anterior frontal and parasagittal. Interval growth since 2017. Elective neurosurgical consultation is warranted. LEFT facial cellulitis more optimally evaluated on MR than CT, with a central focus of restricted diffusion and ill-defined fluid collection in the LEFT malar region consistent with phlegmon and developing abscess. Findings discussed with ordering provider. Electronically Signed   By: Staci Righter M.D.   On: 08/12/2017 15:56   Ct Maxillofacial W Contrast  Result Date: 08/12/2017 CLINICAL DATA:  LEFT side of face swelling.   M54 EXAM: CT MAXILLOFACIAL WITH CONTRAST TECHNIQUE: Multidetector CT imaging of the maxillofacial structures was performed with intravenous contrast. Multiplanar CT image reconstructions were also generated. CONTRAST:  63mL ISOVUE-370 IOPAMIDOL (ISOVUE-370) INJECTION 76% COMPARISON:  CT maxillofacial 02/02/2016. FINDINGS: Osseous: No fracture or mandibular dislocation. No destructive process. LEFT mandibular molar root canal, appears uncomplicated. LEFT mandibular premolar dental caries, similar to priors. Hounsfield artifact from previous dental amalgam. Orbits: LEFT preseptal periorbital soft tissue swelling. No postseptal inflammation. Globes are symmetric. Sinuses: Minor maxillary, ethmoid, and sphenoid sinus disease. No significant opacity or layering fluid. Soft tissues: Cellulitic change over the LEFT malar soft tissues, involving the masticator and buccinator spaces, extends inferomedially to the submandibular space. No concern for Ludwig's angina. Subcutaneous skin thickening without visible foreign body or drainable abscess. No visible parotid or submandibular stones. Limited intracranial: There is a 16 x 22 mm RIGHT frontal intracranial mass lesion, incompletely evaluated, but suspected to be a meningioma. Compared with  priors, a similar appearance was noted. IMPRESSION: Multispatial LEFT facial cellulitis, without visible foreign body or drainable abscess. LEFT mandibular premolar dental caries, not changed from 2017. No periapical lucency or significant paranasal sinus disease. 16 x 22 mm intracranial mass lesion, suspected meningioma. MRI of the brain without and with contrast is recommended for further evaluation. Electronically Signed   By: Staci Righter M.D.   On: 08/12/2017 11:49    Scheduled Meds: . calcium-vitamin D  1 tablet Oral Daily  . DULoxetine  90 mg Oral Daily  . heparin  5,000 Units Subcutaneous Q8H  . hydrOXYzine  50 mg Oral BID  . methylPREDNISolone (SOLU-MEDROL) injection  40  mg Intravenous Q12H  . nicotine  21 mg Transdermal Daily  . polyethylene glycol  17 g Oral Daily   Continuous Infusions: . clindamycin (CLEOCIN) IV Stopped (08/13/17 0721)    Assessment/Plan:  1. Facial cellulitis and early abscess.  Case discussed with ENT and recommended medical management with antibiotics and steroids.  Continue to monitor. 2. Impaired fasting glucose.  Check a hemoglobin A1c. 3. Meningioma seen on MRI of the brain.  Will put in for neurosurgical consultation for tomorrow. 4. Visual changes in the last year likely not secondary to meningioma 5. Anxiety depression on Cymbalta 6. Tobacco abuse on nicotine patch  Code Status:     Code Status Orders  (From admission, onward)        Start     Ordered   08/12/17 1755  Full code  Continuous     08/12/17 1754    Code Status History    Date Active Date Inactive Code Status Order ID Comments User Context   02/03/2016 0121 02/04/2016 1328 Full Code [id number]  Harvie Bridge, DO ED   12/07/2015 1105 12/10/2015 1242 Full Code [id number]  Schermerhorn, Gwen Her, MD Inpatient     Disposition Plan: Likely will need a few days of IV antibiotics  Consultants:  ENT  Neurology  Neurosurgery for Monday  Time spent: 28 minutes  [place]

## 2017-08-13 NOTE — Consult Note (Signed)
Carol Mooney, Carol Mooney [id number] 1975-07-16 Loletha Grayer, MD  Reason for Consult: facial cellulitis  HPI: 42 y.o. Female admitted for facial cellulitis.  Reports on Thursday noted a bump on her left cheek.  Went to [facility] clinic and prescribed Bactrim.  Facial swelling began to progress and presented to ED yesterday a.m.  CT and MRI performed and showed left sided facial cellulitis/phlegmon.  Patient reports some mild improvement from yesterday.  Denies any vision change.  On review in 2017 a similar episode happened and she was treated with Gentamicin and Bactrim at that time.  She reports she followed up with Dr. Richardson Landry and had clean exam with no residual mass/cyst noted.  Allergies:  Allergies  Allergen Reactions  . Morphine Itching  . Morphine And Related Itching    ROS: Review of systems normal other than 12 systems except per HPI.  PMH:  Past Medical History:  Diagnosis Date  . Allergy   . Anxiety   . Complication of anesthesia    nausea and vomiting  . Depression   . PONV (postoperative nausea and vomiting)     FH:  Family History  Problem Relation Age of Onset  . Coronary artery disease Father   . Depression Father   . Diabetes Father        insulin dependent  . Depression Brother   . Cancer Maternal Grandmother        breast  . Breast cancer Maternal Grandmother 37  . Depression Sister   . Breast cancer Paternal Grandmother 80    SH:  Social History   Socioeconomic History  . Marital status: Married    Spouse name: Not on file  . Number of children: Not on file  . Years of education: Not on file  . Highest education level: Not on file  Occupational History  . Not on file  Social Needs  . Financial resource strain: Not on file  . Food insecurity:    Worry: Not on file    Inability: Not on file  . Transportation needs:    Medical: Not on file    Non-medical: Not on file  Tobacco Use  . Smoking status: Current Every Day Smoker    Packs/day: 0.50     Types: Cigarettes    Last attempt to quit: 05/19/2013    Years since quitting: 4.2  . Smokeless tobacco: Never Used  Substance and Sexual Activity  . Alcohol use: Yes    Alcohol/week: 1.2 oz    Types: 2 Shots of liquor per week    Comment: very rarely  . Drug use: No  . Sexual activity: Yes    Birth control/protection: Other-see comments    Comment: hysterectomy  Lifestyle  . Physical activity:    Days per week: Not on file    Minutes per session: Not on file  . Stress: Not on file  Relationships  . Social connections:    Talks on phone: Not on file    Gets together: Not on file    Attends religious service: Not on file    Active member of club or organization: Not on file    Attends meetings of clubs or organizations: Not on file    Relationship status: Not on file  . Intimate partner violence:    Fear of current or ex partner: Not on file    Emotionally abused: Not on file    Physically abused: Not on file    Forced sexual activity: Not on file  Other Topics Concern  . Not on file  Social History Narrative  . Not on file    PSH:  Past Surgical History:  Procedure Laterality Date  . c sections    . HYSTERECTOMY ABDOMINAL WITH SALPINGECTOMY Bilateral 12/07/2015   Procedure: HYSTERECTOMY ABDOMINAL WITH SALPINGECTOMY/EXCISION OF ENDOMETRIOS;  Surgeon: Boykin Nearing, MD;  Location: [facility];  Service: Gynecology;  Laterality: Bilateral;  . OOPHORECTOMY Bilateral 12/07/2015   Procedure: OOPHORECTOMY;  Surgeon: Boykin Nearing, MD;  Location: [facility];  Service: Gynecology;  Laterality: Bilateral;  . TUBAL LIGATION  2000  . UTERINE STENT PLACEMENT Bilateral 12/07/2015   Procedure: UTERINE STENT PLACEMENT/REMOVAL;  Surgeon: Boykin Nearing, MD;  Location: [facility];  Service: Gynecology;  Laterality: Bilateral;    Physical  Exam:  GEN-  CN 2-12 grossly intact and symmetric. EARS-  External ears clear NOSE-  Clear anteriorly OC/OP-  No masses or  lesion NECK-  No LAD FACE-  Left sided erythema and induration, no obvious fluctuance palpated, mild edema of left lower eyelid but soft RESP-  Unlabored CARD-  Regular  CT reviewed- left sided facial cellulitis/phelgmon  A/P: Facial cellulitis with phlegmon  Plan:  No drainable fluid collection on CT/exam today.  MRI with developing phlegmon/abscess.  Agree with IV Abx given history of MRSA.  Would also consider addition of steroids to help with inflammation.  Will follow and drain if palpable fluid collection develops after induration improves.   Lyn Joens 08/13/2017 7:55 AM

## 2017-08-14 ENCOUNTER — Encounter: Payer: Self-pay | Admitting: Neurological Surgery

## 2017-08-14 DIAGNOSIS — D320 Benign neoplasm of cerebral meninges: Secondary | ICD-10-CM | POA: Diagnosis not present

## 2017-08-14 DIAGNOSIS — D329 Benign neoplasm of meninges, unspecified: Secondary | ICD-10-CM

## 2017-08-14 HISTORY — DX: Benign neoplasm of meninges, unspecified: D32.9

## 2017-08-14 MED ORDER — DIPHENHYDRAMINE HCL 25 MG PO CAPS: 25.0000 mg | ORAL_CAPSULE | Freq: Four times a day (QID) | ORAL | Status: DC | PRN

## 2017-08-14 MED ORDER — FLUCONAZOLE 50 MG PO TABS
150.0000 mg | ORAL_TABLET | Freq: Once | ORAL | Status: AC
  Administered 2017-08-14: 150 mg via ORAL
  Filled 2017-08-14: qty 3

## 2017-08-14 MED ORDER — TRIAMCINOLONE ACETONIDE 0.1 % EX CREA
TOPICAL_CREAM | Freq: Two times a day (BID) | CUTANEOUS | Status: DC
  Administered 2017-08-14: 13:00:00 via TOPICAL
  Administered 2017-08-14: 1 via TOPICAL
  Administered 2017-08-15: 11:00:00 via TOPICAL
  Filled 2017-08-14: qty 15

## 2017-08-14 NOTE — Progress Notes (Signed)
Nutrition Brief Note  Patient identified on the Malnutrition Screening Tool (MST) Report  42 y.o. female with a known history of anxiety and depression, came in with facial swelling on left side face and incidental finding of meningioma on Frontal lobe.  Wt Readings from Last 15 Encounters:  08/12/17 161 lb (73 kg)  05/17/17 174 lb (78.9 kg)  06/17/16 156 lb 9.6 oz (71 kg)  02/11/16 168 lb 3.2 oz (76.3 kg)  02/03/16 166 lb 4.8 oz (75.4 kg)  02/02/16 160 lb (72.6 kg)  01/02/16 160 lb (72.6 kg)  12/07/15 164 lb (74.4 kg)  11/05/15 172 lb (78 kg)  11/03/15 174 lb (78.9 kg)  08/13/15 178 lb (80.7 kg)  04/13/15 178 lb (80.7 kg)  03/22/15 178 lb (80.7 kg)  12/07/14 176 lb (79.8 kg)  11/11/14 178 lb 3.2 oz (80.8 kg)    Body mass index is 26.79 kg/m. Patient meets criteria for overweight based on current BMI. Per chart, pt is weight stable pta.   Current diet order is regular, patient is consuming approximately 100% of meals at this time. Labs and medications reviewed.   No nutrition interventions warranted at this time. If nutrition issues arise, please consult RD. Pt has declined all supplements in the past.   Koleen Distance MS, RD, LDN Pager #[phone] After Hours Pager: [phone]

## 2017-08-14 NOTE — Progress Notes (Signed)
..   08/14/2017 8:56 AM  Carol Mooney [id number]  Hospital day  2    Temp:  [97.6 F (36.4 C)-98.1 F (36.7 C)] 98.1 F (36.7 C) (04/29 0606) Pulse Rate:  [62-78] 67 (04/29 0606) Resp:  [18-19] 18 (04/29 0606) BP: (114-133)/(71-97) 124/97 (04/29 0606) SpO2:  [96 %-100 %] 96 % (04/29 0606),     Intake/Output Summary (Last 24 hours) at 08/14/2017 0856 Last data filed at 08/14/2017 0710 Gross per 24 hour  Intake 650 ml  Output 3200 ml  Net -2550 ml    No results found for this or any previous visit (from the past 24 hour(s)).  SUBJECTIVE:  No acute events.  Feeling some better today.  Continues to have pain.  OBJECTIVE:  GEN-  NAD FACE-  Continued induration but improved edema and erythema.  Continued tenderness to palpation.  Small papule with some very slight fluctuation surrounding  IMPRESSION:  Facial cellulitis/phelgmon with history of MRSA  PLAN:  Negative blood cultures so far.  Cont IV abx and then switch to PO with MRSA coverage.  May need needle I&D if continues to collect fluid.  Will continue to follow.  Carol Mooney 08/14/2017, 8:56 AM

## 2017-08-14 NOTE — Progress Notes (Signed)
Patient ID: Carol Mooney, female   DOB: [birth year], 42 y.o.   MRN: [id number]  [employer] PROGRESS NOTE  Carol Mooney [id number]:[id number] DOB: 11-15-1975 DOA: 08/12/2017 PCP: Carol Ginsberg, PA  HPI/Subjective: Patient still having pain in the left face.  Only feels a little bit better than yesterday.  Still a lot of fullness there.  Objective: Vitals:   08/14/17 0606 08/14/17 1141  BP: (!) 124/97 136/81  Pulse: 67 62  Resp: 18 16  Temp: 98.1 F (36.7 C) 98.6 F (37 C)  SpO2: 96% 100%    Filed Weights   08/12/17 0947  Weight: 73 kg (161 lb)    ROS: Review of Systems  Constitutional: Negative for chills and fever.  Eyes: Negative for blurred vision.  Respiratory: Negative for cough and shortness of breath.   Cardiovascular: Negative for chest pain.  Gastrointestinal: Negative for abdominal pain, constipation, diarrhea, nausea and vomiting.  Genitourinary: Negative for dysuria.  Musculoskeletal: Negative for joint pain.  Neurological: Negative for dizziness and headaches.   Exam: Physical Exam  HENT:  Nose: No mucosal edema.  Mouth/Throat: No oropharyngeal exudate or posterior oropharyngeal edema.  Eyes: Pupils are equal, round, and reactive to light. Conjunctivae, EOM and lids are normal.  Neck: No JVD present. Carotid bruit is not present. No edema present. No thyroid mass and no thyromegaly present.  Cardiovascular: S1 normal and S2 normal. Exam reveals no gallop.  No murmur heard. Pulses:      Dorsalis pedis pulses are 2+ on the right side, and 2+ on the left side.  Respiratory: No respiratory distress. She has no wheezes. She has no rhonchi. She has no rales.  GI: Soft. Bowel sounds are normal. There is no tenderness.  Musculoskeletal:       Right shoulder: She exhibits no swelling.  Lymphadenopathy:    She has no cervical adenopathy.  Neurological: She is alert. No cranial nerve deficit.  Skin: Skin is warm. Nails show no clubbing.  Left facial fullness  and erythema.  Tenderness to palpation.  Psychiatric: She has a normal mood and affect.      Data Reviewed: Basic Metabolic Panel: Recent Labs  Lab 08/12/17 1027 08/13/17 0341  NA 137 137  K 3.4* 3.8  CL 100* 101  CO2 27 31  GLUCOSE 140* 153*  BUN 10 9  CREATININE 0.80 0.63  CALCIUM 9.4 8.6*   CBC: Recent Labs  Lab 08/12/17 1027 08/13/17 0341  WBC 12.6* 9.0  HGB 14.7 13.5  HCT 43.1 40.3  MCV 98.2 99.5  PLT 299 265     Recent Results (from the past 240 hour(s))  Blood culture (routine x 2)     Status: None (Preliminary result)   Collection Time: 08/12/17 10:15 AM  Result Value Ref Range Status   Specimen Description BLOOD R FAl  Final   Special Requests   Final    BOTTLES DRAWN AEROBIC AND ANAEROBIC Blood Culture adequate volume   Culture   Final    NO GROWTH < 24 HOURS Performed at [facility], [address] [place], [place] [address]    Report Status PENDING  Incomplete  Blood culture (routine x 2)     Status: None (Preliminary result)   Collection Time: 08/12/17 10:25 AM  Result Value Ref Range Status   Specimen Description BLOOD L FA  Final   Special Requests   Final    BOTTLES DRAWN AEROBIC AND ANAEROBIC Blood Culture results may not be optimal due to  an excessive volume of blood received in culture bottles   Culture   Final    NO GROWTH < 24 HOURS Performed at [facility], [address], [place], [place] [address]    Report Status PENDING  Incomplete     Studies: Mr Carol Mooney And Wo Contrast  Result Date: 08/12/2017 CLINICAL DATA:  Headache.  Facial cellulitis. EXAM: MRI HEAD WITHOUT AND WITH CONTRAST TECHNIQUE: Multiplanar, multiecho pulse sequences of the brain and surrounding structures were obtained without and with intravenous contrast. CONTRAST:  58mL MULTIHANCE GADOBENATE DIMEGLUMINE 529 MG/ML IV SOLN COMPARISON:  CT maxillofacial earlier today. FINDINGS: Brain: No acute stroke, hemorrhage, hydrocephalus, or extra-axial  fluid. Mild atrophy. No significant white matter disease. Post infusion, there is evidence for a solid, avidly enhancing, extra-axial mass, in the anterior cranial fossa, with attachment both to the dura posterior to the frontal sinus as well as to the falx. Measurements are 21 x 17 x 22 mm. Imaging characteristics are classic for meningioma. There is no regional vasogenic edema. There could be possible ingrowth into the superior sagittal sinus, but this would be of little clinical consequence, as the sinus is so small at this location. There has been interval growth since 2017. Vascular: Normal flow voids. Skull and upper cervical spine: Normal marrow signal. Sinuses/Orbits: Negative. Other: In the LEFT malar region, there is an approximate 9 x 13 mm focus of restricted diffusion, of concern for focal phlegmon, ill-defined margins, but consistent with developing abscess. This area is poorly visualized on CT, in part due to streak artifact. This collection is also visualized on post infusion imaging, most clearly on series 12, image 9, and series 13, image 29. IMPRESSION: Post infusion brain imaging findings consistent with a incidental meningioma, 21 x 17 x 22 mm, LEFT anterior frontal and parasagittal. Interval growth since 2017. Elective neurosurgical consultation is warranted. LEFT facial cellulitis more optimally evaluated on MR than CT, with a central focus of restricted diffusion and ill-defined fluid collection in the LEFT malar region consistent with phlegmon and developing abscess. Findings discussed with ordering provider. Electronically Signed   By: Carol Mooney M.D.   On: 08/12/2017 15:56    Scheduled Meds: . calcium-vitamin D  1 tablet Oral Daily  . DULoxetine  90 mg Oral Daily  . heparin  5,000 Units Subcutaneous Q8H  . hydrOXYzine  50 mg Oral BID  . methylPREDNISolone (SOLU-MEDROL) injection  40 mg Intravenous Q12H  . nicotine  21 mg Transdermal Daily  . polyethylene glycol  17 g Oral  Daily  . triamcinolone cream   Topical BID   Continuous Infusions: . clindamycin (CLEOCIN) IV 600 mg (08/14/17 5102)    Assessment/Plan:  1. Facial cellulitis and early abscess.  Continue clindamycin and and steroids.  Continue to monitor. 2. Impaired fasting glucose.   patient not a diabetic. 3. Meningioma seen on MRI of the brain.  Neurosurgical consultation for today 4. Visual changes in the last year likely not secondary to meningioma 5. Anxiety depression on Cymbalta 6. Tobacco abuse on nicotine patch 7. Rash on front of the chest triamcinolone cream, as needed Benadryl  Code Status:     Code Status Orders  (From admission, onward)        Start     Ordered   08/12/17 1755  Full code  Continuous     08/12/17 1754    Code Status History    Date Active Date Inactive Code Status Order ID Comments User Context  02/03/2016 0121 02/04/2016 1328 Full Code [id number]  Harvie Bridge, DO ED   12/07/2015 1105 12/10/2015 1242 Full Code [id number]  Schermerhorn, Gwen Her, MD Inpatient     Disposition Plan: Likely will need a few days of IV antibiotics  Consultants:  ENT  Neurology  Neurosurgery for Monday  Time spent: 26 minutes  [place]

## 2017-08-14 NOTE — Consult Note (Signed)
NEUROSURGERY INPATIENT CONSULT  Carol Mooney 24-Jan-1976 [id number] 08/12/2017 2  Date of Service:  08/14/2017  Patient Care Team: Carmon Ginsberg, PA as PCP - General (Family Medicine)  Consult Requested by:  Leslye Peer  Chief Complaint: Meningioma  History of Present Illness: 42 year old woman presented to the emergency department with left facial swelling.  Imaging was obtained demonstrating an abscess.  Incidentally a right frontal meningioma was identified.  Neurosurgery was consulted for their opinion.  She has occasional headaches in the bifrontal region.  She has had changes in vision.  No nausea.  No vomiting.  No weakness.  No difficulty with gait.  Past Medical History  Patient Active Problem List   Diagnosis Date Noted  . Cellulitis 08/12/2017  . Facial cellulitis 08/12/2017  . Depression, major, recurrent, moderate ([place]) 03/22/2017  . History of MRSA infection 02/11/2016  . Preseptal cellulitis 02/03/2016  . Protein-calorie malnutrition, severe 02/03/2016  . Post-operative state 12/07/2015  . Endometriosis 09/01/2015  . Clinical depression 10/13/2014    Past Medical History:  Diagnosis Date  . Allergy   . Anxiety   . Complication of anesthesia    nausea and vomiting  . Depression   . PONV (postoperative nausea and vomiting)      Past Surgical History:  Procedure Laterality Date  . c sections    . HYSTERECTOMY ABDOMINAL WITH SALPINGECTOMY Bilateral 12/07/2015   Procedure: HYSTERECTOMY ABDOMINAL WITH SALPINGECTOMY/EXCISION OF ENDOMETRIOS;  Surgeon: Boykin Nearing, MD;  Location: [facility];  Service: Gynecology;  Laterality: Bilateral;  . OOPHORECTOMY Bilateral 12/07/2015   Procedure: OOPHORECTOMY;  Surgeon: Boykin Nearing, MD;  Location: [facility];  Service: Gynecology;  Laterality: Bilateral;  . TUBAL LIGATION  2000  . UTERINE STENT PLACEMENT Bilateral 12/07/2015   Procedure: UTERINE STENT PLACEMENT/REMOVAL;  Surgeon: Boykin Nearing, MD;   Location: [facility] OR[facility];  Service: Gynecology;  Laterality: Bilateral;     See epic for inpatient medications  Allergies  Allergen Reactions  . Morphine Itching  . Morphine And Related Itching     Social History   Tobacco Use  . Smoking status: Current Every Day Smoker    Packs/day: 0.50    Types: Cigarettes    Last attempt to quit: 05/19/2013    Years since quitting: 4.2  . Smokeless tobacco: Never Used  Substance Use Topics  . Alcohol use: Yes    Alcohol/week: 1.2 oz    Types: 2 Shots of liquor per week    Comment: very rarely     Family History  Problem Relation Age of Onset  . Coronary artery disease Father   . Depression Father   . Diabetes Father        insulin dependent  . Depression Brother   . Cancer Maternal Grandmother        breast  . Breast cancer Maternal Grandmother 40  . Depression Sister   . Breast cancer Paternal Grandmother 54     ROS: As per HPI; all others negative   EXAMINATION  VITALS: BP 136/81   Pulse 62   Temp 98.6 F (37 C) (Oral)   Resp 16   Ht 5\' 5"  (1.651 m)   Wt 73 kg (161 lb)   LMP 10/11/2015   SpO2 100%   BMI 26.79 kg/m    Vitals Current Average / Min / Max     Temp    98.6 F (37 C)    Temp  Min: 97.6 F (36.4 C)  Max: 98.6 F (37 C)  BP     136/81     BP  Min: 124/97  Max: 136/81     HR    62    Pulse  Avg: 63.7  Min: 62  Max: 67     RR    16    Resp  Avg: 17.3  Min: 16  Max: 18     Sats    100 %    SpO2  Min: 96 %  Max: 100 %     Weight    73 kg (161 lb)    Admit: 73 kg (161 lb)    Intake/Output Summary (Last 24 hours) at 08/14/2017 1549 Last data filed at 08/14/2017 1330 Gross per 24 hour  Intake 290 ml  Output 3450 ml  Net -3160 ml    GENERAL: Healthy-appearing, Well-developed, well-nourished.   MUSCULOSKELETAL:   Normal bulk and tone of upper and lower extremities.   No abnormal movements or fasiculations.   HIGHER INTEGRATIVE FUNCTIONS:  GCS: 15 Oriented to person, place, and time Recent and  remote memory normal Attention and concentration normal Language function: normal naming, repetition and repeating phrases Fund of knowledge normal.   CRANIAL NERVES: 3rd, 4th, 6th Cranial nerves:  Pupils equal, round, react to light, full extraocular movements. 5th CN:  No deficit in facial sensation to light touch 7th CN:  Facial muscles strong and symmetric. 9th CN:  Palate movement full and symmetric. 11th CN:  No deficit in shoulder shrug. 12th CN:  Tongue protrusion Midline.   STRENGTH:  Full and symmetric strength in upper extremities Full and symmetric strength in lower extremities   SENSATION: Trace hypnesthesia right middle digit   COORDINATION:  Finger-nose shows no ataxia.  Fine finger movements normal.  LABORATORY: Lab Results  Component Value Date   NA 137 08/13/2017   K 3.8 08/13/2017   CL 101 08/13/2017   WBC 9.0 08/13/2017   HGB 13.5 08/13/2017   HCT 40.3 08/13/2017   INR 0.9 06/17/2016     IMAGING: MRI of the brain demonstrates an approximate 2 x 2 centimeter right frontal pole meningioma.  There is no mass-effect.  There is no underlying cerebral edema.  The meningioma lies adjacent to the origin of the superior sagittal sinus.  No evidence of bone invasion.  Please see the radiology report for additional details.   ASSESSMENT AND PLAN: Asymptomatic, incidentally discovered right frontal meningioma  1.  No current indication for neurosurgical intervention 2.  Recommend patient follow-up in approximately 2 months.  We will repeat MRI at that time.  If meningioma unchanged in size we will continue with serial imaging.  If documented growth on MRI, we will consider surgical resection.  I answered the patient's questions in detail in lay terms.  She is in agreement with the plan.  ACTIVE PROBLEMS: Principal Problem:   Cellulitis Active Problems:   Facial cellulitis   This note was dictated using voice recognition software.  Please contact me if there  are any questions regarding its content.  Electronically signed by: Kara Dies. Gertie Exon, MD  08/14/2017 3:49 PM

## 2017-08-15 MED ORDER — CLINDAMYCIN HCL 300 MG PO CAPS: 300.0000 mg | ORAL_CAPSULE | Freq: Three times a day (TID) | ORAL | 0 refills | Status: AC

## 2017-08-15 MED ORDER — TRIAMCINOLONE ACETONIDE 0.1 % EX CREA: TOPICAL_CREAM | Freq: Two times a day (BID) | CUTANEOUS | 0 refills | Status: DC

## 2017-08-15 MED ORDER — NICOTINE 21 MG/24HR TD PT24: 21.0000 mg | MEDICATED_PATCH | Freq: Every day | TRANSDERMAL | 0 refills | Status: DC

## 2017-08-15 MED ORDER — OXYCODONE HCL 10 MG PO TABS: 10.0000 mg | ORAL_TABLET | Freq: Four times a day (QID) | ORAL | 0 refills | Status: DC | PRN

## 2017-08-15 MED ORDER — GENTAMICIN SULFATE 0.1 % EX OINT: 1.0000 "application " | TOPICAL_OINTMENT | Freq: Three times a day (TID) | CUTANEOUS | 0 refills | Status: DC

## 2017-08-15 MED ORDER — PREDNISONE 10 MG PO TABS: ORAL_TABLET | ORAL | 0 refills | Status: DC

## 2017-08-15 MED ORDER — HYDROXYZINE HCL 50 MG PO TABS: 25.0000 mg | ORAL_TABLET | Freq: Two times a day (BID) | ORAL | 0 refills | Status: DC | PRN

## 2017-08-15 NOTE — Discharge Summary (Signed)
[facility] [place] NAME: Carol Mooney    MR#:  [id number]  DATE OF BIRTH:  [birth year]  DATE OF ADMISSION:  08/12/2017 ADMITTING PHYSICIAN: Vaughan Basta, MD  DATE OF DISCHARGE: 08/15/2017  PRIMARY CARE PHYSICIAN: Carmon Ginsberg, PA    ADMISSION DIAGNOSIS:  Preseptal cellulitis [L03.213]  DISCHARGE DIAGNOSIS:  Principal Problem:   Cellulitis Active Problems:   Facial cellulitis   Meningioma ([place])   SECONDARY DIAGNOSIS:   Past Medical History:  Diagnosis Date  . Allergy   . Anxiety   . Complication of anesthesia    nausea and vomiting  . Depression   . Meningioma ([place]) 08/14/2017  . PONV (postoperative nausea and vomiting)     HOSPITAL COURSE:   1.  Left facial cellulitis and early abscess.  The patient was started on IV clindamycin.  The patient was seen in consultation by Dr. Pryor Ochoa ENT.  On 08/15/2017, the patient had a needle drainage of 4 to 5 mL of drainage by Dr. Pryor Ochoa.  Continue quick prednisone taper and oral clindamycin upon discharge home.  Patient also prescribed a small amount of oxycodone. 2.  Impaired fasting glucose.  Hemoglobin A1c is 5.7.  Patient is not a diabetic. 3.  Meningioma seen on MRI of the brain.  Patient seen by neurosurgery.  Follow-up as outpatient. 4.  Visual changes in the last year not secondary to meningioma. 5.  Anxiety depression on Cymbalta 6.  Tobacco abuse on nicotine patch  DISCHARGE CONDITIONS:   Satisfactory  CONSULTS OBTAINED:  Treatment Team:  Carloyn Manner, MD Leotis Pain, MD Kathlene November, MD  DRUG ALLERGIES:   Allergies  Allergen Reactions  . Morphine Itching  . Morphine And Related Itching    DISCHARGE MEDICATIONS:   Allergies as of 08/15/2017      Reactions   Morphine Itching   Morphine And Related Itching      Medication List    STOP taking these medications   HYDROcodone-acetaminophen 5-325 MG tablet Commonly known as:   NORCO/VICODIN   sulfamethoxazole-trimethoprim 800-160 MG tablet Commonly known as:  BACTRIM DS,SEPTRA DS     TAKE these medications   acetaminophen 325 MG tablet Commonly known as:  TYLENOL Take 650 mg by mouth every 6 (six) hours as needed.   Calcium + D3 600-200 MG-UNIT Tabs Take 1 tablet by mouth daily.   clindamycin 300 MG capsule Commonly known as:  CLEOCIN Take 1 capsule (300 mg total) by mouth 3 (three) times daily for 7 days.   DULoxetine 30 MG capsule Commonly known as:  CYMBALTA Take 1 capsule by mouth daily.   gentamicin ointment 0.1 % Commonly known as:  GARAMYCIN Apply 1 application topically 3 (three) times daily. To face   hydrOXYzine 50 MG tablet Commonly known as:  ATARAX/VISTARIL Take 0.5 tablets (25 mg total) by mouth 2 (two) times daily as needed for itching.   levonorgestrel-ethinyl estradiol 0.15-0.03 MG tablet Commonly known as:  SEASONALE,INTROVALE,JOLESSA Take 1 tablet by mouth daily.   nicotine 21 mg/24hr patch Commonly known as:  NICODERM CQ - dosed in mg/24 hours Place 1 patch (21 mg total) onto the skin daily. Start taking on:  08/16/2017   Oxycodone HCl 10 MG Tabs Take 1 tablet (10 mg total) by mouth every 6 (six) hours as needed.   polyethylene glycol packet Commonly known as:  MIRALAX / GLYCOLAX Take 17 g by mouth daily.   predniSONE 10 MG tablet Commonly known as:  DELTASONE 4  tabs po day1; 3 tabs po day2; 2 tabs po day 3; 1 tab po day4   senna 8.6 MG Tabs tablet Commonly known as:  SENOKOT Take 1 tablet by mouth daily as needed for mild constipation.   triamcinolone cream 0.1 % Commonly known as:  KENALOG Apply topically 2 (two) times daily.   valACYclovir 1000 MG tablet Commonly known as:  VALTREX TAKE 2 TABLETS BY MOUTH EVERY 12 HOURS FOR 1 DAY AT ONSET OF COLD SORE        DISCHARGE INSTRUCTIONS:   Follow-up PMD 1 week Follow-up Dr. Pryor Ochoa on Friday Follow-up neurosurgery in a few months  If you experience  worsening of your admission symptoms, develop shortness of breath, life threatening emergency, suicidal or homicidal thoughts you must seek medical attention immediately by calling 911 or calling your MD immediately  if symptoms less severe.  You Must read complete instructions/literature along with all the possible adverse reactions/side effects for all the Medicines you take and that have been prescribed to you. Take any new Medicines after you have completely understood and accept all the possible adverse reactions/side effects.   Please note  You were cared for by a hospitalist during your hospital stay. If you have any questions about your discharge medications or the care you received while you were in the hospital after you are discharged, you can call the unit and asked to speak with the hospitalist on call if the hospitalist that took care of you is not available. Once you are discharged, your primary care physician will handle any further medical issues. Please note that NO REFILLS for any discharge medications will be authorized once you are discharged, as it is imperative that you return to your primary care physician (or establish a relationship with a primary care physician if you do not have one) for your aftercare needs so that they can reassess your need for medications and monitor your lab values.    Today   CHIEF COMPLAINT:   Chief Complaint  Patient presents with  . Facial Swelling    HISTORY OF PRESENT ILLNESS:  Carol Mooney  is a 42 y.o. female came in with facial swelling and redness  VITAL SIGNS:  Blood pressure 134/78, pulse 62, temperature 98.6 F (37 C), temperature source Oral, resp. rate 18, height 5\' 5"  (1.651 m), weight 73 kg (161 lb), last menstrual period 10/11/2015, SpO2 99 %.    PHYSICAL EXAMINATION:  GENERAL:  42 y.o.-year-old patient lying in the bed with no acute distress.  EYES: Pupils equal, round, reactive to light and accommodation. No scleral  icterus. Extraocular muscles intact.  HEENT: Head atraumatic, normocephalic. Oropharynx and nasopharynx clear.  NECK:  Supple, no jugular venous distention. No thyroid enlargement, no tenderness.  LUNGS: Normal breath sounds bilaterally, no wheezing, rales,rhonchi or crepitation. No use of accessory muscles of respiration.  CARDIOVASCULAR: S1, S2 normal. No murmurs, rubs, or gallops.  ABDOMEN: Soft, non-tender, non-distended. Bowel sounds present. No organomegaly or mass.  EXTREMITIES: No pedal edema, cyanosis, or clubbing.  NEUROLOGIC: Cranial nerves II through XII are intact. Muscle strength 5/5 in all extremities. Sensation intact. Gait not checked.  PSYCHIATRIC: The patient is alert and oriented x 3.  SKIN:  left facial swelling less than when she came in.  Erythema is much improved.  DATA REVIEW:   CBC Recent Labs  Lab 08/13/17 0341  WBC 9.0  HGB 13.5  HCT 40.3  PLT 265    Chemistries  Recent Labs  Lab 08/13/17  0341  NA 137  K 3.8  CL 101  CO2 31  GLUCOSE 153*  BUN 9  CREATININE 0.63  CALCIUM 8.6*     Microbiology Results  Results for orders placed or performed during the hospital encounter of 08/12/17  Blood culture (routine x 2)     Status: None (Preliminary result)   Collection Time: 08/12/17 10:15 AM  Result Value Ref Range Status   Specimen Description BLOOD R FAl  Final   Special Requests   Final    BOTTLES DRAWN AEROBIC AND ANAEROBIC Blood Culture adequate volume   Culture   Final    NO GROWTH 3 DAYS Performed at [facility], [address] [place], [place] [address]    Report Status PENDING  Incomplete  Blood culture (routine x 2)     Status: None (Preliminary result)   Collection Time: 08/12/17 10:25 AM  Result Value Ref Range Status   Specimen Description BLOOD L FA  Final   Special Requests   Final    BOTTLES DRAWN AEROBIC AND ANAEROBIC Blood Culture results may not be optimal due to an excessive volume of blood received in culture  bottles   Culture   Final    NO GROWTH 3 DAYS Performed at [facility], [address] [place], [place] [address]    Report Status PENDING  Incomplete    Management plans discussed with the patient, family and they are in agreement.  CODE STATUS:     Code Status Orders  (From admission, onward)        Start     Ordered   08/12/17 1755  Full code  Continuous     08/12/17 1754    Code Status History    Date Active Date Inactive Code Status Order ID Comments User Context   02/03/2016 0121 02/04/2016 1328 Full Code [id number]  Harvie Bridge, DO ED   12/07/2015 1105 12/10/2015 1242 Full Code [id number]  Schermerhorn, Gwen Her, MD Inpatient      TOTAL TIME TAKING CARE OF THIS PATIENT: 35 minutes.    Loletha Grayer M.D on 08/15/2017 at 3:28 PM  Between 7am to 6pm - Pager - [phone]  After 6pm go to [email or web address] - Proofreader  [employer] Office  [phone]  CC: Primary care physician; Carmon Ginsberg, [place]

## 2017-08-15 NOTE — Discharge Instructions (Addendum)

## 2017-08-15 NOTE — Progress Notes (Signed)
..   08/15/2017 8:19 AM  Mariane Baumgarten [id number]   Temp:  [98 F (36.7 C)-98.6 F (37 C)] 98.6 F (37 C) (04/30 0407) Pulse Rate:  [62-77] 77 (04/30 0407) Resp:  [16-20] 20 (04/30 0407) BP: (119-136)/(81-98) 119/98 (04/30 0407) SpO2:  [97 %-100 %] 97 % (04/30 0407),     Intake/Output Summary (Last 24 hours) at 08/15/2017 0819 Last data filed at 08/15/2017 0232 Gross per 24 hour  Intake -  Output 2750 ml  Net -2750 ml    No results found for this or any previous visit (from the past 24 hour(s)).  SUBJECTIVE:  No acute events overnight.  Feeling better this morning.  Some drainage from left cheek this morning.  OBJECTIVE:  GEN-  NAD FACE-  Improved induration and erythema and edema.  Decreased tenderness.  Increased area of fluctuance beneath papule of left cheek.  IMPRESSION:  Phlegmonous cellulitis of face with small developing abscess  PLAN:  Will plan to drain around lunch time with needle I&D.  OK to discharge per ENT perspective after drainage given significant improvement over last 24 hours.  Giovany Cosby 08/15/2017, 8:19 AM

## 2017-08-15 NOTE — Op Note (Signed)
..  08/15/2017  12:22 PM    Mariane Baumgarten  [id number]   Pre-Op Dx:  Left facial abscess  Post-op Dx: same  Proc: Needle I&D Left facial abscess   Surg:  Sherrita Riederer  Anes:  GOT  EBL:  none  Comp:  none  Findings:  Left sided facial fluctuance and fluid collection  Procedure: After verbal consent was obtained, the left facial was prepped with sterile alcohol.  1/2 ml of 1% lidocaine with 1:100,000 epinephrine was injected into the subcutaneous tissue overlying the left cheek.  After injection, an 18 gauge needle was used to aspiration about 1 ml of purulent drainage.  An additional 4 to 68ml of purulent drainage was expressed from the wound after aspiration.    Dispo:   Back to care of floor  Plan:  Send culture for gram stain.  Follow up as outpatient on Friday at 1:15p.m.  D/c on clindamycin  Stalin Gruenberg  08/15/2017 12:22 PM

## 2017-08-15 NOTE — Progress Notes (Signed)
Pt was given D/C instructions and she was alerted to her appointments. She was released to her friend and wheeled out. Pt stated she had no questions and was happy with her care.

## 2017-08-16 ENCOUNTER — Telehealth: Payer: Self-pay

## 2017-08-17 ENCOUNTER — Other Ambulatory Visit: Payer: Self-pay | Admitting: *Deleted

## 2017-08-17 DIAGNOSIS — F330 Major depressive disorder, recurrent, mild: Secondary | ICD-10-CM | POA: Diagnosis not present

## 2017-08-17 DIAGNOSIS — F411 Generalized anxiety disorder: Secondary | ICD-10-CM | POA: Diagnosis not present

## 2017-08-17 DIAGNOSIS — F5105 Insomnia due to other mental disorder: Secondary | ICD-10-CM | POA: Diagnosis not present

## 2017-08-17 LAB — MRSA CULTURE
Culture: NOT DETECTED
Special Requests: NORMAL

## 2017-08-17 LAB — AEROBIC CULTURE  (SUPERFICIAL SPECIMEN)

## 2017-08-17 LAB — CULTURE, BLOOD (ROUTINE X 2)
Culture: NO GROWTH
Culture: NO GROWTH
Special Requests: ADEQUATE

## 2017-08-17 LAB — AEROBIC CULTURE W GRAM STAIN (SUPERFICIAL SPECIMEN)

## 2017-08-17 NOTE — Patient Outreach (Signed)
[facility]) Care Management  08/17/2017  Carol Mooney Feb 15, 1976 [id number]   Subjective: Telephone call to patient's home  / mobile number, no answer, left HIPAA compliant voicemail message, and requested call back.      Objective: Per KPN (Knowledge Performance Now, point of care tool) and chart review, patient hospitalized 08/12/17 -08/15/17.  Patient also has a history of Meningioma.     Assessment: Received [facility] Transition of care referral on 08/15/17.   Transition of care follow up pending patient contact.        Plan: RNCM will send unsuccessful outreach  letter, [facility] pamphlet, will call patient for 2nd telephone outreach attempt, transition of care follow up, and proceed with case closure, within 10 business days if no return call.        Lanice Folden H. Annia Friendly, BSN, [place] Management [facility] Telephonic CM Phone: [phone] Fax: [phone]

## 2017-08-18 ENCOUNTER — Other Ambulatory Visit: Payer: Self-pay

## 2017-08-18 DIAGNOSIS — L0201 Cutaneous abscess of face: Secondary | ICD-10-CM | POA: Diagnosis not present

## 2017-08-18 NOTE — Patient Outreach (Signed)
[facility]) Care Management  08/18/2017  AKAYSHA COBERN Apr 16, 1976 [id number]  EMMI: General discharge red alert.  Referral date: 08/18/17 Referral reason: unfilled prescriptions: yes Day # 1  Telephone call to patient regarding EMMI general dscharge.  HIPAA verified with patient. Explained reason for call. Patient state she has all of her prescriptions. Patient states the automated call recorded her answering wrong. Patient states she is taking all of her medications as prescribed. States she has a follow up appointment scheduled with her doctor on 08/21/17 and has a follow up with ENT doctor on today. Patient states she has transportation to her appointments.  Denies having any new symptoms or concerns. Patient states she knows which doctor to call if she is having symptoms/ complications. RNCM advised patient to notify MD of any changes in condition prior to scheduled appointment. RNCM verified patient aware of 911 services for urgent/ emergent needs.   PLAN:   RNCM will close patient due to patient being assessed and having no further needs.   Quinn Plowman RN,BSN,CCM [facility] Telephonic  [phone]

## 2017-08-18 NOTE — Telephone Encounter (Signed)
I have tried to contact pt on two separate occasions and been unsuccessful. VMs were left both times requesting a CB and no CB has been made. Hospital f/u is scheduled for 08/22/17. FYI to PCP. -MM

## 2017-08-21 ENCOUNTER — Inpatient Hospital Stay: Payer: 59 | Admitting: Family Medicine

## 2017-08-22 ENCOUNTER — Other Ambulatory Visit: Payer: Self-pay

## 2017-08-22 ENCOUNTER — Inpatient Hospital Stay: Payer: 59 | Admitting: Family Medicine

## 2017-08-22 NOTE — Patient Outreach (Signed)
[facility]) Care Management  08/22/2017  Carol Mooney 1975/05/08 [id number]   Telephone call for transition of care call attempt#2.  Member was hospitalized on4/27/19 for preseptal cellulitis and discharged on 08/15/17.  No answer and unable to leave message as mail box is full.  Member as sent outreach letter on 08/18/17. Plan to schedule 3nd call attempt in 3-4 business day by [facility] and case to be closed per workflow after 10th business day if unable to contact member. Peter Garter RN, [facility] Care Management Coordinator [facility] Care Management [phone]

## 2017-08-25 ENCOUNTER — Other Ambulatory Visit: Payer: Self-pay | Admitting: *Deleted

## 2017-08-25 NOTE — Patient Outreach (Signed)
[name]) Care Management  08/25/2017  Carol Mooney 30-Aug-1975 [id number]   Case closure completed due to patient assessed on 08/18/17 by [place] Management  RNCM, and no needs identified.      [name] H. Annia Friendly, BSN, [place] Management [facility] Telephonic CM Phone: [phone] Fax: [phone]

## 2017-08-29 LAB — HIV ANTIBODY (ROUTINE TESTING W REFLEX): HIV Screen 4th Generation wRfx: NONREACTIVE

## 2017-09-25 DIAGNOSIS — F330 Major depressive disorder, recurrent, mild: Secondary | ICD-10-CM | POA: Diagnosis not present

## 2017-09-25 DIAGNOSIS — F5105 Insomnia due to other mental disorder: Secondary | ICD-10-CM | POA: Diagnosis not present

## 2017-09-25 DIAGNOSIS — F411 Generalized anxiety disorder: Secondary | ICD-10-CM | POA: Diagnosis not present

## 2017-10-03 DIAGNOSIS — D329 Benign neoplasm of meninges, unspecified: Secondary | ICD-10-CM | POA: Diagnosis not present

## 2017-10-17 DIAGNOSIS — L0201 Cutaneous abscess of face: Secondary | ICD-10-CM | POA: Diagnosis not present

## 2017-11-28 DIAGNOSIS — F411 Generalized anxiety disorder: Secondary | ICD-10-CM | POA: Diagnosis not present

## 2017-11-28 DIAGNOSIS — F5105 Insomnia due to other mental disorder: Secondary | ICD-10-CM | POA: Diagnosis not present

## 2017-11-28 DIAGNOSIS — F330 Major depressive disorder, recurrent, mild: Secondary | ICD-10-CM | POA: Diagnosis not present

## 2017-12-12 ENCOUNTER — Other Ambulatory Visit: Payer: Self-pay

## 2017-12-12 ENCOUNTER — Emergency Department: Payer: 59

## 2017-12-12 ENCOUNTER — Emergency Department
Admission: EM | Admit: 2017-12-12 | Discharge: 2017-12-12 | Disposition: A | Discharge status: Home / Self Care | Payer: 59 | Attending: Emergency Medicine | Admitting: Emergency Medicine

## 2017-12-12 ENCOUNTER — Encounter: Payer: Self-pay | Admitting: Emergency Medicine

## 2017-12-12 DIAGNOSIS — R6884 Jaw pain: Secondary | ICD-10-CM | POA: Diagnosis not present

## 2017-12-12 DIAGNOSIS — R079 Chest pain, unspecified: Secondary | ICD-10-CM | POA: Diagnosis not present

## 2017-12-12 DIAGNOSIS — Z79899 Other long term (current) drug therapy: Secondary | ICD-10-CM | POA: Diagnosis not present

## 2017-12-12 DIAGNOSIS — R61 Generalized hyperhidrosis: Secondary | ICD-10-CM | POA: Insufficient documentation

## 2017-12-12 DIAGNOSIS — R0602 Shortness of breath: Secondary | ICD-10-CM | POA: Diagnosis not present

## 2017-12-12 DIAGNOSIS — F17210 Nicotine dependence, cigarettes, uncomplicated: Secondary | ICD-10-CM | POA: Insufficient documentation

## 2017-12-12 LAB — BASIC METABOLIC PANEL
Anion gap: 8 (ref 5–15)
BUN: 8 mg/dL (ref 6–20)
CO2: 24 mmol/L (ref 22–32)
Calcium: 9.7 mg/dL (ref 8.9–10.3)
Chloride: 109 mmol/L (ref 98–111)
Creatinine, Ser: 0.62 mg/dL (ref 0.44–1.00)
GFR calc Af Amer: >60 mL/min (ref >60)
GFR calc non Af Amer: >60 mL/min (ref >60)
Glucose, Bld: 108 mg/dL — ABNORMAL HIGH (ref 70–99)
Potassium: 3.8 mmol/L (ref 3.5–5.1)
Sodium: 141 mmol/L (ref 135–145)

## 2017-12-12 LAB — CBC
HCT: 41.4 % (ref 35.0–47.0)
Hemoglobin: 14.1 g/dL (ref 12.0–16.0)
MCH: 33.4 pg (ref 26.0–34.0)
MCHC: 34 g/dL (ref 32.0–36.0)
MCV: 98.2 fL (ref 80.0–100.0)
Platelets: 317 10*3/uL (ref 150–440)
RBC: 4.21 MIL/uL (ref 3.80–5.20)
RDW: 13.7 % (ref 11.5–14.5)
WBC: 7.8 10*3/uL (ref 3.6–11.0)

## 2017-12-12 LAB — TROPONIN I
Troponin I: <0.03 ng/mL (ref <0.03)
Troponin I: <0.03 ng/mL (ref <0.03)

## 2017-12-12 MED ORDER — NITROGLYCERIN 2 % TD OINT
0.5000 [in_us] | TOPICAL_OINTMENT | Freq: Once | TRANSDERMAL | Status: AC
  Administered 2017-12-12: 0.5 [in_us] via TOPICAL
  Filled 2017-12-12: qty 1

## 2017-12-12 MED ORDER — ISOSORBIDE MONONITRATE ER 60 MG PO TB24
30.0000 mg | ORAL_TABLET | Freq: Every day | ORAL | Status: DC
  Administered 2017-12-12: 30 mg via ORAL
  Filled 2017-12-12: qty 1

## 2017-12-12 NOTE — ED Notes (Signed)
Pt resting. Respirations even and unlabored. NAD. Stretcher in low and locked position. Call bell in reach. Denies needs at this time. Family at bedside. Will continue to monitor. 

## 2017-12-12 NOTE — ED Provider Notes (Signed)
[facility] Emergency Department Provider Note   ____________________________________________   First MD Initiated Contact with Patient 12/12/17 1627     (approximate)  I have reviewed the triage vital signs and the nursing notes.   HISTORY  Chief Complaint Chest Pain   HPI Carol Mooney is a 42 y.o. female patient reports onset of chest pain radiating to the right jaw with shortness of breath and sweating starting this morning while she was laying in bed.  He does not seem to be worse with any exertion.  She does not have any pain with deep breathing.  Pain seemed to get better when she took 1 of her father's nitroglycerin but then got worse again.  Here in the emergency room the pain got better again with nitroglycerin.  {**SYMPTOM/COMPLAINT LOCATION(describe anatomically)  Past Medical History:  Diagnosis Date  . Allergy   . Anxiety   . Complication of anesthesia    nausea and vomiting  . Depression   . Meningioma ([place]) 08/14/2017  . PONV (postoperative nausea and vomiting)     Patient Active Problem List   Diagnosis Date Noted  . Meningioma ([place]) 08/14/2017  . Cellulitis 08/12/2017  . Facial cellulitis 08/12/2017  . Depression, major, recurrent, moderate ([place]) 03/22/2017  . History of MRSA infection 02/11/2016  . Preseptal cellulitis 02/03/2016  . Protein-calorie malnutrition, severe 02/03/2016  . Post-operative state 12/07/2015  . Endometriosis 09/01/2015  . Clinical depression 10/13/2014    Past Surgical History:  Procedure Laterality Date  . c sections    . HYSTERECTOMY ABDOMINAL WITH SALPINGECTOMY Bilateral 12/07/2015   Procedure: HYSTERECTOMY ABDOMINAL WITH SALPINGECTOMY/EXCISION OF ENDOMETRIOS;  Surgeon: Boykin Nearing, MD;  Location: [facility] ORS;  Service: Gynecology;  Laterality: Bilateral;  . OOPHORECTOMY Bilateral 12/07/2015   Procedure: OOPHORECTOMY;  Surgeon: Boykin Nearing, MD;  Location: [facility] ORS;  Service:  Gynecology;  Laterality: Bilateral;  . TUBAL LIGATION  2000  . UTERINE STENT PLACEMENT Bilateral 12/07/2015   Procedure: UTERINE STENT PLACEMENT/REMOVAL;  Surgeon: Boykin Nearing, MD;  Location: [facility] ORS;  Service: Gynecology;  Laterality: Bilateral;    Prior to Admission medications   Medication Sig Start Date End Date Taking? Authorizing Provider  acetaminophen (TYLENOL) 325 MG tablet Take 650 mg by mouth every 6 (six) hours as needed.    [provider]  Calcium Carb-Cholecalciferol (CALCIUM + D3) 600-200 MG-UNIT TABS Take 1 tablet by mouth daily.    [provider]  DULoxetine (CYMBALTA) 30 MG capsule Take 1 capsule by mouth daily. 07/31/17   [provider]  gentamicin ointment (GARAMYCIN) 0.1 % Apply 1 application topically 3 (three) times daily. To face 08/15/17   Loletha Grayer, MD  hydrOXYzine (ATARAX/VISTARIL) 50 MG tablet Take 0.5 tablets (25 mg total) by mouth 2 (two) times daily as needed for itching. 08/15/17   Loletha Grayer, MD  levonorgestrel-ethinyl estradiol (SEASONALE,INTROVALE,JOLESSA) 0.15-0.03 MG tablet Take 1 tablet by mouth daily.    [provider]  nicotine (NICODERM CQ - DOSED IN MG/24 HOURS) 21 mg/24hr patch Place 1 patch (21 mg total) onto the skin daily. 08/16/17   Loletha Grayer, MD  Oxycodone HCl 10 MG TABS Take 1 tablet (10 mg total) by mouth every 6 (six) hours as needed. 08/15/17   Loletha Grayer, MD  polyethylene glycol (MIRALAX / GLYCOLAX) packet Take 17 g by mouth daily.    [provider]  predniSONE (DELTASONE) 10 MG tablet 4 tabs po day1; 3 tabs po day2; 2 tabs po day  3; 1 tab po day4 08/15/17   Loletha Grayer, MD  senna (SENOKOT) 8.6 MG TABS tablet Take 1 tablet by mouth daily as needed for mild constipation.    [provider]  triamcinolone cream (KENALOG) 0.1 % Apply topically 2 (two) times daily. 08/15/17   Loletha Grayer, MD  valACYclovir (VALTREX) 1000 MG tablet TAKE 2 TABLETS BY MOUTH  EVERY 12 HOURS FOR 1 DAY AT ONSET OF COLD SORE 06/29/17   Carmon Ginsberg, PA    Allergies Morphine and Morphine and related  Family History  Problem Relation Age of Onset  . Coronary artery disease Father   . Depression Father   . Diabetes Father        insulin dependent  . Depression Brother   . Cancer Maternal Grandmother        breast  . Breast cancer Maternal Grandmother 27  . Depression Sister   . Breast cancer Paternal Grandmother 88    Social History Social History   Tobacco Use  . Smoking status: Current Every Day Smoker    Packs/day: 1.00    Types: Cigarettes    Last attempt to quit: 05/19/2013    Years since quitting: 4.5  . Smokeless tobacco: Never Used  Substance Use Topics  . Alcohol use: Yes    Alcohol/week: 2.0 standard drinks    Types: 2 Shots of liquor per week    Comment: very rarely  . Drug use: No    Review of Systems  Constitutional: No fever/chills Eyes: No visual changes. ENT: No sore throat. Cardiovascular:  chest pain. Respiratory:shortness of breath. Gastrointestinal: No abdominal pain. nausea, no vomiting.  No diarrhea.  No constipation. Genitourinary: Negative for dysuria. Musculoskeletal: Negative for back pain. Skin: Negative for rash. Neurological: Negative for headaches, focal weakness  ____________________________________________   PHYSICAL EXAM:  VITAL SIGNS: ED Triage Vitals  Enc Vitals Group     BP 12/12/17 1526 134/86     Pulse Rate 12/12/17 1526 (!) 103     Resp 12/12/17 1555 (!) 21     Temp 12/12/17 1526 98.6 F (37 C)     Temp Source 12/12/17 1526 Oral     SpO2 12/12/17 1526 99 %     Weight 12/12/17 1527 150 lb (68 kg)     Height 12/12/17 1527 5\' 6"  (1.676 m)     Head Circumference --      Peak Flow --      Pain Score 12/12/17 1534 7     Pain Loc --      Pain Edu? --      Excl. in [place]? --     Constitutional: Alert and oriented. Well appearing and in no acute distress. Eyes: Conjunctivae are normal.    Head: Atraumatic. Nose: No congestion/rhinnorhea. Mouth/Throat: Mucous membranes are moist.  Oropharynx non-erythematous. Neck: No stridor.   Cardiovascular: Normal rate, regular rhythm. Grossly normal heart sounds.  Good peripheral circulation. Respiratory: Normal respiratory effort.  No retractions. Lungs CTAB. Gastrointestinal: Soft and nontender. No distention. No abdominal bruits. No CVA tenderness. Musculoskeletal: No lower extremity tenderness nor edema.   Neurologic:  Normal speech and language. No gross focal neurologic deficits are appreciated.. Skin:  Skin is warm, dry and intact. No rash noted.   ____________________________________________   LABS (all labs ordered are listed, but only abnormal results are displayed)  Labs Reviewed  BASIC METABOLIC PANEL - Abnormal; Notable for the following components:      Result Value   Glucose, Bld 108 (*)  All other components within normal limits  CBC  TROPONIN I  TROPONIN I   ____________________________________________  EKG  EKG read and interpreted by me shows normal sinus rhythm rate of 81 normal axis no acute ST-T wave changes _EKG #2 read and interpreted by me shows normal sinus rhythm rate of 61 normal axis no acute ST-T wave changes no change from #1 ___________________________________________  RADIOLOGY  ED MD interpretation: Chest x-ray read by radiology reviewed by me shows no acute abnormalities  Official radiology report(s): Dg Chest 2 View  Result Date: 12/12/2017 CLINICAL DATA:  Chest pain centrally radiating to RIGHT jaw, associated diaphoresis EXAM: CHEST - 2 VIEW COMPARISON:  01/02/2016 FINDINGS: Normal heart size, mediastinal contours, and pulmonary vascularity. Lungs clear. No pulmonary infiltrate, pleural effusion or pneumothorax. Bones unremarkable. IMPRESSION: No acute abnormalities. Electronically Signed   By: Lavonia Dana M.D.   On: 12/12/2017 16:22     ____________________________________________   PROCEDURES  Procedure(s) performed:   Procedures  Critical Care performed:  ____________________________________________   INITIAL IMPRESSION / ASSESSMENT AND PLAN / ED COURSE  EKGs x2 show no pathology opponents are negative.  I am not sure what the patient's pain is caused by.  He could possibly be esophageal spasm.  It does not seem to be cardiac.  It does not sound like pulmonary embolus type pain either.  She has no chest tenderness on palpation.  I will plan on discharging her and having her follow-up with the cardiologist.         ____________________________________________   FINAL CLINICAL IMPRESSION(S) / ED DIAGNOSES  Final diagnoses:  Chest pain, unspecified type     ED Discharge Orders    None       Note:  This document was prepared using Dragon voice recognition software and may include unintentional dictation errors.    Nena Polio, MD 12/12/17 [phone]

## 2017-12-12 NOTE — ED Notes (Signed)
Pt resting. Respirations even and unlabored. NAD. Stretcher in low and locked position. Call bell in reach. Denies needs at this time. Family at bedside. Pt updated on plan for repeat troponin at 1800. Will continue to monitor.

## 2017-12-12 NOTE — ED Triage Notes (Signed)
Pt in via POV with complaints of central chest pain with radiation to right jaw, reports associated diaphoresis, denies any other complaints.  NAD noted at this time.

## 2017-12-12 NOTE — ED Notes (Signed)
Pt feeling some lightheadedness after getting nitro. Denies headache. CP now 3/10. Denies needs at this time.

## 2017-12-12 NOTE — Discharge Instructions (Addendum)
Please go to see Dr. Humphrey Rolls the cardiologist in his office tomorrow.  He wants you to show up at 1230 to begin filling out paperwork and he will see you at 1:00.  Please return here if the pain is worse.

## 2017-12-13 DIAGNOSIS — R079 Chest pain, unspecified: Secondary | ICD-10-CM | POA: Diagnosis not present

## 2017-12-13 DIAGNOSIS — I480 Paroxysmal atrial fibrillation: Secondary | ICD-10-CM | POA: Diagnosis not present

## 2017-12-21 DIAGNOSIS — R079 Chest pain, unspecified: Secondary | ICD-10-CM | POA: Diagnosis not present

## 2017-12-26 DIAGNOSIS — I4891 Unspecified atrial fibrillation: Secondary | ICD-10-CM | POA: Diagnosis not present

## 2017-12-26 DIAGNOSIS — R0602 Shortness of breath: Secondary | ICD-10-CM | POA: Diagnosis not present

## 2017-12-26 DIAGNOSIS — R079 Chest pain, unspecified: Secondary | ICD-10-CM | POA: Diagnosis not present

## 2017-12-26 DIAGNOSIS — R002 Palpitations: Secondary | ICD-10-CM | POA: Diagnosis not present

## 2017-12-29 DIAGNOSIS — R9430 Abnormal result of cardiovascular function study, unspecified: Secondary | ICD-10-CM | POA: Diagnosis not present

## 2017-12-29 DIAGNOSIS — R072 Precordial pain: Secondary | ICD-10-CM | POA: Diagnosis not present

## 2018-01-02 ENCOUNTER — Other Ambulatory Visit: Payer: Self-pay | Admitting: Neurological Surgery

## 2018-01-02 DIAGNOSIS — D329 Benign neoplasm of meninges, unspecified: Secondary | ICD-10-CM

## 2018-01-04 DIAGNOSIS — R079 Chest pain, unspecified: Secondary | ICD-10-CM | POA: Diagnosis not present

## 2018-01-04 DIAGNOSIS — R002 Palpitations: Secondary | ICD-10-CM | POA: Diagnosis not present

## 2018-01-04 DIAGNOSIS — I10 Essential (primary) hypertension: Secondary | ICD-10-CM | POA: Diagnosis not present

## 2018-01-04 DIAGNOSIS — I4891 Unspecified atrial fibrillation: Secondary | ICD-10-CM | POA: Diagnosis not present

## 2018-01-04 DIAGNOSIS — R0602 Shortness of breath: Secondary | ICD-10-CM | POA: Diagnosis not present

## 2018-01-10 ENCOUNTER — Encounter: Payer: Self-pay | Admitting: Emergency Medicine

## 2018-01-10 ENCOUNTER — Ambulatory Visit (INDEPENDENT_AMBULATORY_CARE_PROVIDER_SITE_OTHER): Payer: Self-pay | Admitting: Physician Assistant

## 2018-01-10 VITALS — BP 120/80 | HR 73 | Temp 98.0°F | Wt 153.0 lb

## 2018-01-10 DIAGNOSIS — R319 Hematuria, unspecified: Secondary | ICD-10-CM

## 2018-01-10 DIAGNOSIS — R112 Nausea with vomiting, unspecified: Secondary | ICD-10-CM

## 2018-01-10 LAB — POCT URINALYSIS DIPSTICK
Bilirubin, UA: NEGATIVE
Glucose, UA: NEGATIVE
Ketones, UA: NEGATIVE
Leukocytes, UA: NEGATIVE
Nitrite, UA: NEGATIVE
Protein, UA: NEGATIVE
Spec Grav, UA: 1.02 (ref 1.010–1.025)
Urobilinogen, UA: 0.2 E.U./dL
pH, UA: 7 (ref 5.0–8.0)

## 2018-01-10 MED ORDER — TAMSULOSIN HCL 0.4 MG PO CAPS: 0.4000 mg | ORAL_CAPSULE | Freq: Every day | ORAL | 0 refills | Status: AC

## 2018-01-10 MED ORDER — PROMETHAZINE HCL 25 MG PO TABS: 25.0000 mg | ORAL_TABLET | Freq: Four times a day (QID) | ORAL | 0 refills | Status: DC | PRN

## 2018-01-10 NOTE — Patient Instructions (Addendum)
Thank you for choosing [facility] for your health care needs today.  You have been diagnosed with hematuria (blood in your urine). Suspect due to ureteral/kidney stone.  1. Hematuria, unspecified type  - POCT Urinalysis Dipstick - tamsulosin (FLOMAX) 0.4 MG CAPS capsule; Take 1 capsule (0.4 mg total) by mouth daily for 5 days.  Dispense: 5 capsule; Refill: 0  2. Non-intractable vomiting with nausea, unspecified vomiting type  - promethazine (PHENERGAN) 25 MG tablet; Take 1 tablet (25 mg total) by mouth every 6 (six) hours as needed for up to 3 days for nausea or vomiting.  Dispense: 10 tablet; Refill: 0  Take medication as prescribed.  Increase fluids; recommend water and/or Gatorade.  Take Tylenol or ibuprofen for pain/discomfort.  Follow-up at [facility] or at urgent care or at [facility] office if not improving in 2-3 days.  Kidney Stones Kidney stones (urolithiasis) are solid, rock-like deposits that form inside of the organs that make urine (kidneys). A kidney stone may form in a kidney and move into the bladder, where it can cause intense pain and block the flow of urine. Kidney stones are created when high levels of certain minerals are found in the urine. They are usually passed through urination, but in some cases, medical treatment may be needed to remove them. What are the causes? Kidney stones may be caused by:  A condition in which certain glands produce too much parathyroid hormone (primary hyperparathyroidism), which causes too much calcium buildup in the blood.  Buildup of uric acid crystals in the bladder (hyperuricosuria). Uric acid is a chemical that the body produces when you eat certain foods. It usually exits the body in the urine.  Narrowing (stricture) of one or both of the tubes that drain urine from the kidneys to the bladder (ureters).  A kidney blockage that is present at birth (congenital obstruction).  Past surgery on the kidney or the ureters, such as gastric  bypass surgery.  What increases the risk? The following factors make you more likely to develop kidney stones:  Having had a kidney stone in the past.  Having a family history of kidney stones.  Not drinking enough water.  Eating a diet that is high in protein, salt (sodium), or sugar.  Being overweight or obese.  What are the signs or symptoms? Symptoms of a kidney stone may include:  Nausea.  Vomiting.  Blood in the urine (hematuria).  Pain in the side of the abdomen, right below the ribs (flank pain). Pain usually spreads (radiates) to the groin.  Needing to urinate frequently or urgently.  How is this diagnosed? This condition may be diagnosed based on:  Your medical history.  A physical exam.  Blood tests.  Urine tests.  CT scan.  Abdominal X-ray.  A procedure to examine the inside of the bladder (cystoscopy).  How is this treated? Treatment for kidney stones depends on the size, location, and makeup of the stones. Treatment may involve:  Analyzing your urine before and after you pass the stone through urination.  Being monitored at the hospital until you pass the stone through urination.  Increasing your fluid intake and decreasing the amount of calcium and protein in your diet.  A procedure to break up kidney stones in the bladder using: ? A focused beam of light (laser therapy). ? Shock waves (extracorporeal shock wave lithotripsy).  Surgery to remove kidney stones. This may be needed if you have severe pain or have stones that block your urinary tract.  Follow  these instructions at home: Eating and drinking   Drink enough fluid to keep your urine clear or pale yellow. This will help you to pass the kidney stone.  If directed, change your diet. This may include: ? Limiting how much sodium you eat. ? Eating more fruits and vegetables. ? Limiting how much meat, poultry, fish, and eggs you eat.  Follow instructions from your health care  provider about eating or drinking restrictions. General instructions  Collect urine samples as told by your health care provider. You may need to collect a urine sample: ? 24 hours after you pass the stone. ? 8-12 weeks after passing the kidney stone, and every 6-12 months after that.  Strain your urine every time you urinate, for as long as directed. Use the strainer that your health care provider recommends.  Do not throw out the kidney stone after passing it. Keep the stone so it can be tested by your health care provider. Testing the makeup of your kidney stone may help prevent you from getting kidney stones in the future.  Take over-the-counter and prescription medicines only as told by your health care provider.  Keep all follow-up visits as told by your health care provider. This is important. You may need follow-up X-rays or ultrasounds to make sure that your stone has passed. How is this prevented? To prevent another kidney stone:  Drink enough fluid to keep your urine clear or pale yellow. This is the best way to prevent kidney stones.  Eat a healthy diet and follow recommendations from your health care provider about foods to avoid. You may be instructed to eat a low-protein diet. Recommendations vary depending on the type of kidney stone that you have.  Maintain a healthy weight.  Contact a health care provider if:  You have pain that gets worse or does not get better with medicine. Get help right away if:  You have a fever or chills.  You develop severe pain.  You develop new abdominal pain.  You faint.  You are unable to urinate. This information is not intended to replace advice given to you by your health care provider. Make sure you discuss any questions you have with your health care provider. Document Released: 04/04/2005 Document Revised: 10/23/2015 Document Reviewed: 09/18/2015 Elsevier Interactive Patient Education  [employer].

## 2018-01-10 NOTE — Progress Notes (Signed)
Patient ID: CARISMA Mooney DOB: Sep 08, 1975 AGE: 42 y.o. MRN: [id number]   PCP: Carmon Ginsberg, PA   Chief Complaint: Nausea/Vomiting and hematuria   Subjective:    HPI:  Carol Mooney is a 42 y.o. female presents for evaluation of gross hematuria.  Patient reports yesterday mid-day, she developed nausea and malaise. Does not believe secondary to any food she ate that day. Went to bed. Woke up at night to go to the bathroom, noticed right sided low back pain/flank pain. Describes as intermittent clenching/tightness. Patient also with sensation of incomplete emptying when voiding. Patient this morning went to the bathroom, noticed blood on the toilet paper. Continues to have suprapubic pressure. Continues to have episodes of right flank pain. Random spasms. Reports constant nausea and two episodes of vomiting. Has not had any appetite today; has been drinking cranberry juice and has eaten a few crackers.  Patient denies fever, chills, headache, URI symptoms, abdominal pain, hematochezia, diarrhea, constipation, dysuria, frequent urination, foul odor to urine, vaginal rash/pruritis/discharge.  Patient reports last year she had similar symptoms, diagnosed with kidney/ureteral stone, per [facility] urgent care clinic. Unsure if she had imaging. Had nausea/vomiting, flank pain, and hematuria. Resolved with medication.   A complete, at least 10 system review of symptoms was performed, pertinent positives and negatives as mentioned in HPI, otherwise negative.  The following portions of the patient's history were reviewed and updated as appropriate: allergies, current medications and past medical history.  Patient Active Problem List   Diagnosis Date Noted  . Meningioma ([place]) 08/14/2017  . Cellulitis 08/12/2017  . Facial cellulitis 08/12/2017  . Depression, major, recurrent, moderate ([place]) 03/22/2017  . History of MRSA infection 02/11/2016  . Preseptal cellulitis 02/03/2016  .  Protein-calorie malnutrition, severe 02/03/2016  . Post-operative state 12/07/2015  . Endometriosis 09/01/2015  . Clinical depression 10/13/2014    Allergies  Allergen Reactions  . Morphine Itching  . Morphine And Related Itching    Current Outpatient Medications on File Prior to Visit  Medication Sig Dispense Refill  . acetaminophen (TYLENOL) 325 MG tablet Take 650 mg by mouth every 6 (six) hours as needed.    . DULoxetine (CYMBALTA) 30 MG capsule Take 1 capsule by mouth daily.  0  . gentamicin ointment (GARAMYCIN) 0.1 % Apply 1 application topically 3 (three) times daily. To face 15 g 0  . hydrOXYzine (ATARAX/VISTARIL) 50 MG tablet Take 0.5 tablets (25 mg total) by mouth 2 (two) times daily as needed for itching. 20 tablet 0  . losartan (COZAAR) 25 MG tablet   1  . metoprolol succinate (TOPROL-XL) 25 MG 24 hr tablet   0  . polyethylene glycol (MIRALAX / GLYCOLAX) packet Take 17 g by mouth daily.    Marland Kitchen senna (SENOKOT) 8.6 MG TABS tablet Take 1 tablet by mouth daily as needed for mild constipation.    . traZODone (DESYREL) 100 MG tablet Take by mouth.     No current facility-administered medications on file prior to visit.        Objective:    Vitals:   01/10/18 1344  BP: 120/80  Pulse: 73  Temp: 98 F (36.7 C)  SpO2: 99%     Wt Readings from Last 3 Encounters:  01/10/18 153 lb (69.4 kg)  12/12/17 150 lb (68 kg)  08/12/17 161 lb (73 kg)    Physical Exam:   General Appearance:  Alert, cooperative, no distress, appears stated age. Afebrile.  Head:  Normocephalic, without obvious abnormality, atraumatic  Eyes:  PERRL, conjunctiva/corneas clear, EOM's intact, fundi benign, both eyes  Ears:  Normal TM's and external ear canals, both ears  Nose: Nares normal, septum midline,mucosa normal, no drainage or sinus tenderness  Throat: Lips, mucosa, and tongue normal; teeth and gums normal  Neck: Supple, symmetrical, trachea midline, no adenopathy;  thyroid: not enlarged,  symmetric, no tenderness/mass/nodules; no carotid bruit or JVD  Back:   Symmetric, no curvature, ROM normal, right sided CVA tenderness (mild).  Lungs:   Clear to auscultation bilaterally, respirations unlabored  Heart:  Regular rate and rhythm, S1 and S2 normal, no murmur, rub, or gallop  Abdomen:   Soft, non-tender, bowel sounds active all four quadrants,  no masses, no organomegaly. Mild suprapubic pressure with palpation.  Pelvic: Deferred  Extremities: Extremities normal, atraumatic, no cyanosis or edema  Pulses: 2+ and symmetric  Skin: Skin color, texture, turgor normal, no rashes or lesions  Lymph nodes: Cervical, supraclavicular, and axillary nodes normal  Neurologic: Normal    Assessment & Plan:    Exam findings, diagnosis etiology and medication use and indications reviewed with patient. Follow-Up and discharge instructions provided. No emergent/urgent issues found on exam.  Patient education was provided.   Patient verbalized understanding of information provided and agrees with plan of care (POC), all questions answered. The patient is advised to call or return to clinic if condition does not see an improvement in symptoms, or to seek the care of the closest emergency department if condition worsens with the above plan.   1. Hematuria, unspecified type  - POCT Urinalysis Dipstick - tamsulosin (FLOMAX) 0.4 MG CAPS capsule; Take 1 capsule (0.4 mg total) by mouth daily for 5 days.  Dispense: 5 capsule; Refill: 0  2. Non-intractable vomiting with nausea, unspecified vomiting type  - promethazine (PHENERGAN) 25 MG tablet; Take 1 tablet (25 mg total) by mouth every 6 (six) hours as needed for up to 3 days for nausea or vomiting.  Dispense: 10 tablet; Refill: 0  Suspect patient has ureterolithiasis. UA very dilute; however, trace hematuria. Patient with report of gross hematuria, flank pain and nausea/vomiting. Feels similar to previous episode of ureteral stone one year ago,  resolved with medication. Patient prescribed Flomax and Phenergan today. Advised to use OTC pain medication. Patient advised to f/u at urgent care or PCP's office or ED if not improving in 2-3 days for further evaluation, which may include UA with urine culture and/or imaging such as CT scan of abdomen/pelvis.   Montey Hora, MHS, PA-C Advanced Practice Provider [facility]  [address], [facility], [place], [place] [address] (p):  [phone] [email or web address]        .

## 2018-01-12 ENCOUNTER — Telehealth: Payer: Self-pay | Admitting: Emergency Medicine

## 2018-01-12 NOTE — Telephone Encounter (Signed)
LM follow call from [facility] visit with provider

## 2018-01-25 ENCOUNTER — Ambulatory Visit
Admission: RE | Admit: 2018-01-25 | Discharge: 2018-01-25 | Disposition: A | Discharge status: Home / Self Care | Payer: 59 | Source: Ambulatory Visit | Attending: Neurological Surgery | Admitting: Neurological Surgery

## 2018-01-25 DIAGNOSIS — D329 Benign neoplasm of meninges, unspecified: Secondary | ICD-10-CM | POA: Insufficient documentation

## 2018-01-25 DIAGNOSIS — D320 Benign neoplasm of cerebral meninges: Secondary | ICD-10-CM | POA: Diagnosis not present

## 2018-01-25 MED ORDER — GADOBENATE DIMEGLUMINE 529 MG/ML IV SOLN
15.0000 mL | Freq: Once | INTRAVENOUS | Status: AC | PRN
  Administered 2018-01-25: 14 mL via INTRAVENOUS

## 2018-03-02 DIAGNOSIS — F5105 Insomnia due to other mental disorder: Secondary | ICD-10-CM | POA: Diagnosis not present

## 2018-03-02 DIAGNOSIS — F411 Generalized anxiety disorder: Secondary | ICD-10-CM | POA: Diagnosis not present

## 2018-03-02 DIAGNOSIS — F330 Major depressive disorder, recurrent, mild: Secondary | ICD-10-CM | POA: Diagnosis not present

## 2018-06-12 DIAGNOSIS — F330 Major depressive disorder, recurrent, mild: Secondary | ICD-10-CM | POA: Diagnosis not present

## 2018-06-12 DIAGNOSIS — F5105 Insomnia due to other mental disorder: Secondary | ICD-10-CM | POA: Diagnosis not present

## 2018-06-12 DIAGNOSIS — F411 Generalized anxiety disorder: Secondary | ICD-10-CM | POA: Diagnosis not present

## 2018-07-06 ENCOUNTER — Emergency Department
Admission: EM | Admit: 2018-07-06 | Discharge: 2018-07-06 | Disposition: A | Discharge status: Home / Self Care | Payer: 59 | Attending: Emergency Medicine | Admitting: Emergency Medicine

## 2018-07-06 ENCOUNTER — Encounter: Payer: Self-pay | Admitting: *Deleted

## 2018-07-06 ENCOUNTER — Other Ambulatory Visit: Payer: Self-pay

## 2018-07-06 DIAGNOSIS — F17210 Nicotine dependence, cigarettes, uncomplicated: Secondary | ICD-10-CM | POA: Insufficient documentation

## 2018-07-06 DIAGNOSIS — T7421XA Adult sexual abuse, confirmed, initial encounter: Secondary | ICD-10-CM | POA: Insufficient documentation

## 2018-07-06 DIAGNOSIS — T7411XA Adult physical abuse, confirmed, initial encounter: Secondary | ICD-10-CM | POA: Diagnosis not present

## 2018-07-06 DIAGNOSIS — T07XXXA Unspecified multiple injuries, initial encounter: Secondary | ICD-10-CM | POA: Diagnosis not present

## 2018-07-06 DIAGNOSIS — S30810A Abrasion of lower back and pelvis, initial encounter: Secondary | ICD-10-CM | POA: Diagnosis not present

## 2018-07-06 DIAGNOSIS — R000 Tachycardia, unspecified: Secondary | ICD-10-CM | POA: Diagnosis not present

## 2018-07-06 DIAGNOSIS — I10 Essential (primary) hypertension: Secondary | ICD-10-CM | POA: Diagnosis not present

## 2018-07-06 HISTORY — DX: Essential (primary) hypertension: I10

## 2018-07-06 MED ORDER — ONDANSETRON 4 MG PO TBDP
4.0000 mg | ORAL_TABLET | Freq: Once | ORAL | Status: AC
  Administered 2018-07-06: 4 mg via ORAL
  Filled 2018-07-06: qty 1

## 2018-07-06 MED ORDER — METRONIDAZOLE 500 MG PO TABS
2000.0000 mg | ORAL_TABLET | Freq: Once | ORAL | Status: DC
  Filled 2018-07-06: qty 4

## 2018-07-06 MED ORDER — LIDOCAINE HCL (PF) 1 % IJ SOLN
INTRAMUSCULAR | Status: AC
  Filled 2018-07-06: qty 5

## 2018-07-06 MED ORDER — CEFTRIAXONE SODIUM 250 MG IJ SOLR
250.0000 mg | Freq: Once | INTRAMUSCULAR | Status: AC
  Administered 2018-07-06: 250 mg via INTRAMUSCULAR
  Filled 2018-07-06: qty 250

## 2018-07-06 MED ORDER — LORAZEPAM 1 MG PO TABS
1.0000 mg | ORAL_TABLET | Freq: Once | ORAL | Status: AC
  Administered 2018-07-06: 1 mg via ORAL
  Filled 2018-07-06: qty 1

## 2018-07-06 MED ORDER — AZITHROMYCIN 500 MG PO TABS
1000.0000 mg | ORAL_TABLET | Freq: Once | ORAL | Status: AC
  Administered 2018-07-06: 1000 mg via ORAL
  Filled 2018-07-06: qty 2
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
## 2018-07-06 NOTE — ED Notes (Signed)
Pt alerted that her mother is present in ED. Pt verbalized she was okay with mother visiting in room.
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
## 2018-07-06 NOTE — ED Notes (Signed)
Pt and mother escorted out to car by [employer] officer.

## 2018-07-06 NOTE — ED Notes (Signed)
Dr. Norman at bedside.  
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
## 2018-07-06 NOTE — Discharge Instructions (Addendum)
Please go to your local public health department or your primary care physician to be tested for sexually transmitted illnesses, including HIV.  Please also take a pregnancy test.  Return to the emergency department if you develop severe pain, lightheadedness or fainting, severe anxiety, or any other symptoms concerning to you.
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
## 2018-07-09 DIAGNOSIS — F5105 Insomnia due to other mental disorder: Secondary | ICD-10-CM | POA: Diagnosis not present

## 2018-07-09 DIAGNOSIS — F330 Major depressive disorder, recurrent, mild: Secondary | ICD-10-CM | POA: Diagnosis not present

## 2018-07-09 DIAGNOSIS — F4311 Post-traumatic stress disorder, acute: Secondary | ICD-10-CM | POA: Diagnosis not present

## 2018-07-09 DIAGNOSIS — F411 Generalized anxiety disorder: Secondary | ICD-10-CM | POA: Diagnosis not present

## 2018-07-24 DIAGNOSIS — F411 Generalized anxiety disorder: Secondary | ICD-10-CM | POA: Diagnosis not present

## 2018-07-24 DIAGNOSIS — F330 Major depressive disorder, recurrent, mild: Secondary | ICD-10-CM | POA: Diagnosis not present

## 2018-07-24 DIAGNOSIS — F4311 Post-traumatic stress disorder, acute: Secondary | ICD-10-CM | POA: Diagnosis not present

## 2018-07-24 DIAGNOSIS — F5105 Insomnia due to other mental disorder: Secondary | ICD-10-CM | POA: Diagnosis not present

## 2018-08-15 DIAGNOSIS — F411 Generalized anxiety disorder: Secondary | ICD-10-CM | POA: Diagnosis not present

## 2018-08-15 DIAGNOSIS — F4311 Post-traumatic stress disorder, acute: Secondary | ICD-10-CM | POA: Diagnosis not present

## 2018-08-15 DIAGNOSIS — F330 Major depressive disorder, recurrent, mild: Secondary | ICD-10-CM | POA: Diagnosis not present

## 2018-08-15 DIAGNOSIS — F5105 Insomnia due to other mental disorder: Secondary | ICD-10-CM | POA: Diagnosis not present

## 2018-08-16 ENCOUNTER — Telehealth: Payer: Self-pay

## 2018-08-16 NOTE — Telephone Encounter (Signed)
-----   Message from Virginia Crews, MD sent at 08/15/2018  3:48 PM EDT ----- Has she been set up with a new provider yet? ----- Message ----- From: Truitt Merle Sent: 08/15/2018   2:07 PM EDT To: Virginia Crews, MD  Review incoming fax

## 2018-08-16 NOTE — Telephone Encounter (Signed)
LMTCB

## 2018-08-20 ENCOUNTER — Ambulatory Visit: Payer: Self-pay

## 2018-08-21 NOTE — Telephone Encounter (Signed)
LMTCB

## 2018-08-27 NOTE — Telephone Encounter (Signed)
Patient schedule appointment on 09/12/2018 @ 9:40 AM with Dr. Brita Romp.

## 2018-09-02 DIAGNOSIS — F4311 Post-traumatic stress disorder, acute: Secondary | ICD-10-CM | POA: Diagnosis not present

## 2018-09-02 DIAGNOSIS — F5105 Insomnia due to other mental disorder: Secondary | ICD-10-CM | POA: Diagnosis not present

## 2018-09-02 DIAGNOSIS — F411 Generalized anxiety disorder: Secondary | ICD-10-CM | POA: Diagnosis not present

## 2018-09-02 DIAGNOSIS — F330 Major depressive disorder, recurrent, mild: Secondary | ICD-10-CM | POA: Diagnosis not present

## 2018-09-12 ENCOUNTER — Ambulatory Visit (INDEPENDENT_AMBULATORY_CARE_PROVIDER_SITE_OTHER): Payer: 59 | Admitting: Family Medicine

## 2018-09-12 DIAGNOSIS — N809 Endometriosis, unspecified: Secondary | ICD-10-CM | POA: Diagnosis not present

## 2018-09-12 DIAGNOSIS — F331 Major depressive disorder, recurrent, moderate: Secondary | ICD-10-CM

## 2018-09-12 DIAGNOSIS — I10 Essential (primary) hypertension: Secondary | ICD-10-CM
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
## 2018-09-13 DIAGNOSIS — I10 Essential (primary) hypertension: Secondary | ICD-10-CM | POA: Insufficient documentation

## 2018-09-13 NOTE — Assessment & Plan Note (Signed)
Followed by psych and in therapy

## 2018-09-13 NOTE — Assessment & Plan Note (Signed)
Chronic Well-controlled Not on any medications, diet controlled after significant weight loss Continue to monitor Repeat screening labs at next in-person visit
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
## 2018-09-13 NOTE — Assessment & Plan Note (Signed)
Status post TAH and BSO Doing well and no longer on any hormones Discussed with patient that given that her hysterectomy was for benign indications, she likely does not need repeat Pap smears any longer

## 2018-10-05 DIAGNOSIS — F411 Generalized anxiety disorder: Secondary | ICD-10-CM | POA: Diagnosis not present

## 2018-10-05 DIAGNOSIS — F330 Major depressive disorder, recurrent, mild: Secondary | ICD-10-CM | POA: Diagnosis not present

## 2018-10-05 DIAGNOSIS — F5105 Insomnia due to other mental disorder: Secondary | ICD-10-CM | POA: Diagnosis not present

## 2018-10-05 DIAGNOSIS — F4311 Post-traumatic stress disorder, acute: Secondary | ICD-10-CM | POA: Diagnosis not present

## 2018-11-02 DIAGNOSIS — F5105 Insomnia due to other mental disorder: Secondary | ICD-10-CM | POA: Diagnosis not present

## 2018-11-02 DIAGNOSIS — F411 Generalized anxiety disorder: Secondary | ICD-10-CM | POA: Diagnosis not present

## 2018-11-02 DIAGNOSIS — F4311 Post-traumatic stress disorder, acute: Secondary | ICD-10-CM | POA: Diagnosis not present

## 2018-11-02 DIAGNOSIS — F330 Major depressive disorder, recurrent, mild: Secondary | ICD-10-CM | POA: Diagnosis not present

## 2018-12-12 DIAGNOSIS — F419 Anxiety disorder, unspecified: Secondary | ICD-10-CM | POA: Diagnosis not present

## 2018-12-12 DIAGNOSIS — F5101 Primary insomnia: Secondary | ICD-10-CM | POA: Diagnosis not present

## 2018-12-12 DIAGNOSIS — F1020 Alcohol dependence, uncomplicated: Secondary | ICD-10-CM | POA: Diagnosis not present

## 2018-12-12 DIAGNOSIS — F3289 Other specified depressive episodes: Secondary | ICD-10-CM | POA: Diagnosis not present

## 2018-12-12 DIAGNOSIS — T1490XA Injury, unspecified, initial encounter: Secondary | ICD-10-CM | POA: Diagnosis not present

## 2018-12-16 DIAGNOSIS — F1020 Alcohol dependence, uncomplicated: Secondary | ICD-10-CM | POA: Diagnosis not present

## 2018-12-17 DIAGNOSIS — F1020 Alcohol dependence, uncomplicated: Secondary | ICD-10-CM | POA: Diagnosis not present

## 2018-12-18 DIAGNOSIS — F1020 Alcohol dependence, uncomplicated: Secondary | ICD-10-CM | POA: Diagnosis not present

## 2018-12-19 DIAGNOSIS — F1020 Alcohol dependence, uncomplicated: Secondary | ICD-10-CM | POA: Diagnosis not present

## 2018-12-20 DIAGNOSIS — F1020 Alcohol dependence, uncomplicated: Secondary | ICD-10-CM | POA: Diagnosis not present

## 2018-12-20 IMAGING — CR DG CHEST 2V
1 series · 2 of 2 positions shown · non-contrast
Comparison: 01/02/2016

CLINICAL DATA: Chest pain centrally radiating to RIGHT jaw,
associated diaphoresis

EXAM:
CHEST - 2 VIEW

[Series 1: dg chest 2 view · 0.14mm/px · 2 of 2 slices shown]
[im 1/2]
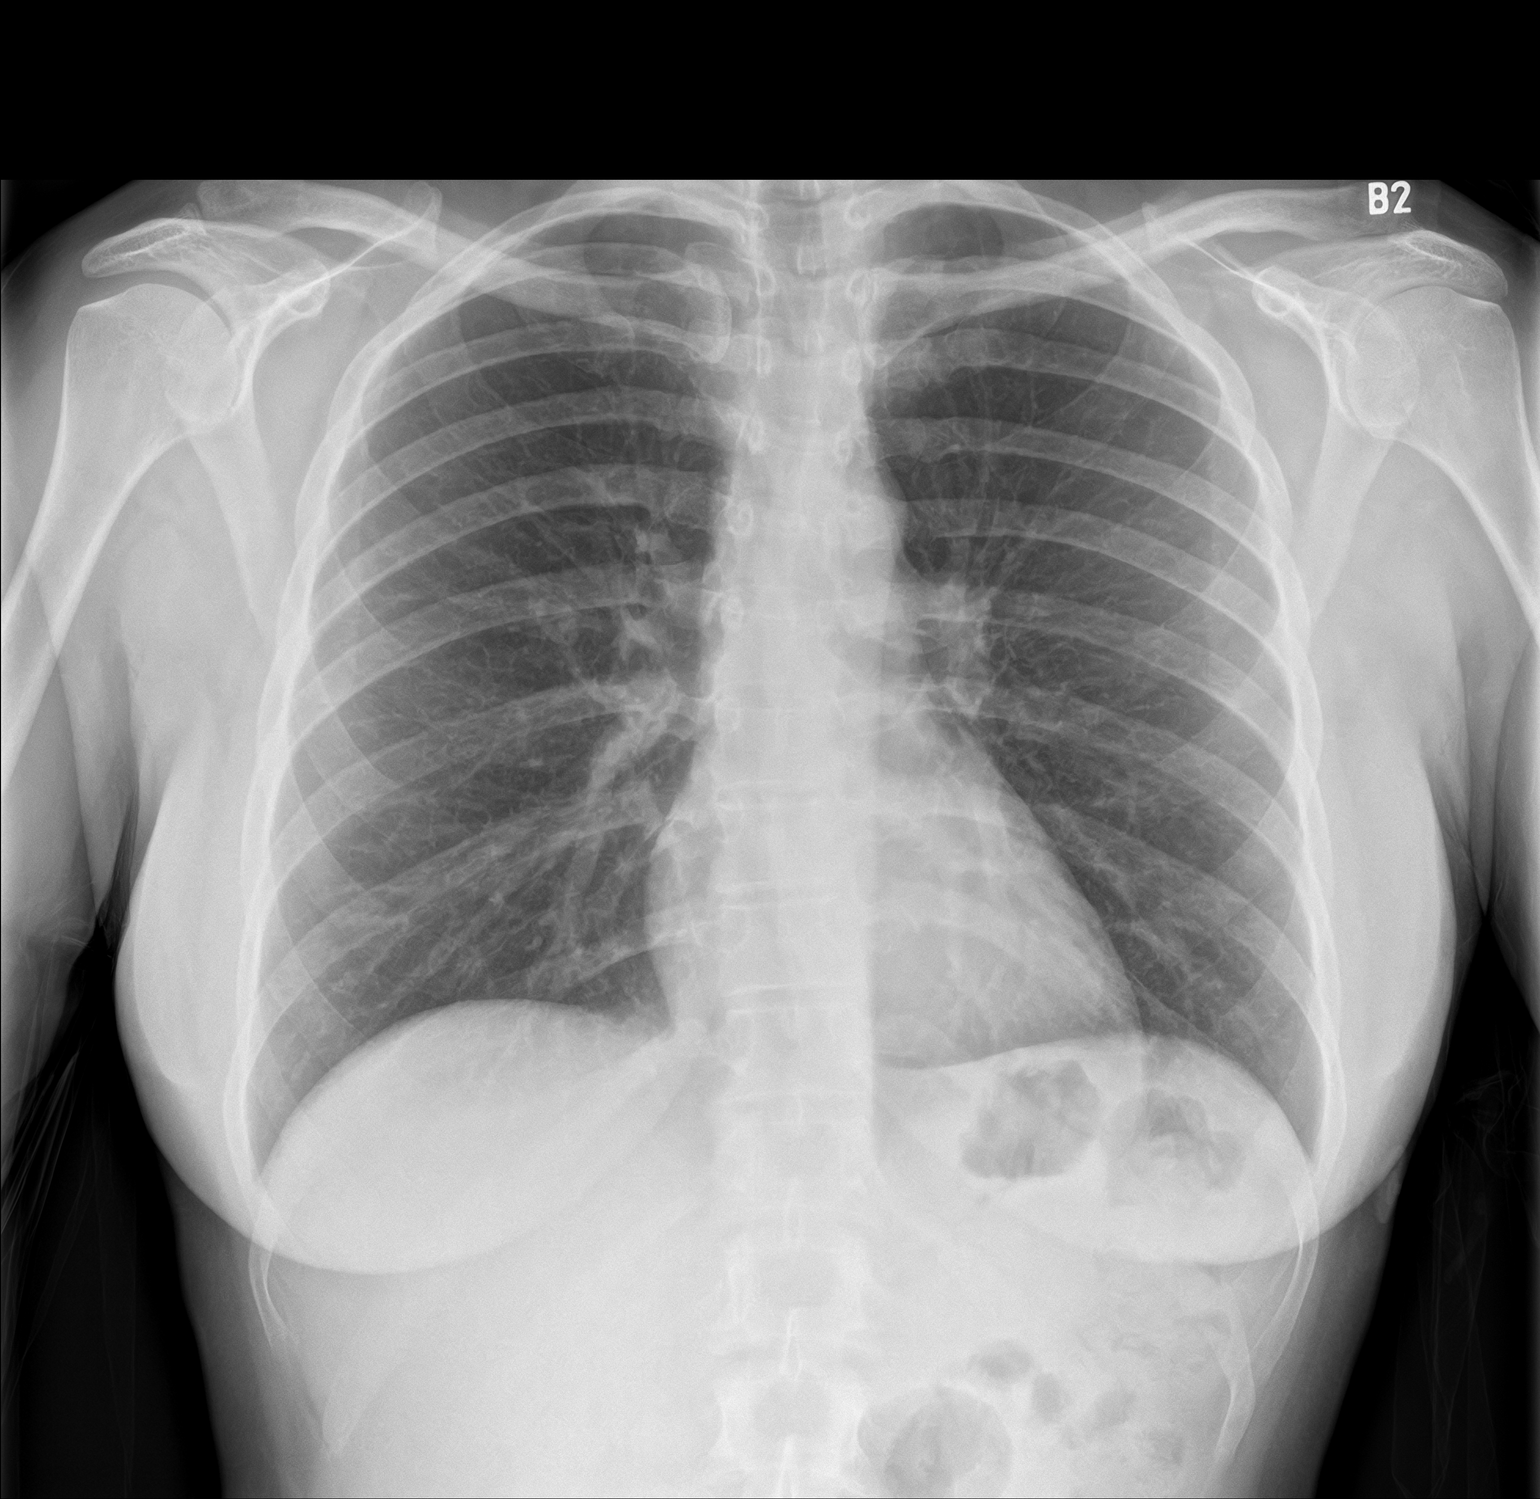
[im 2/2]
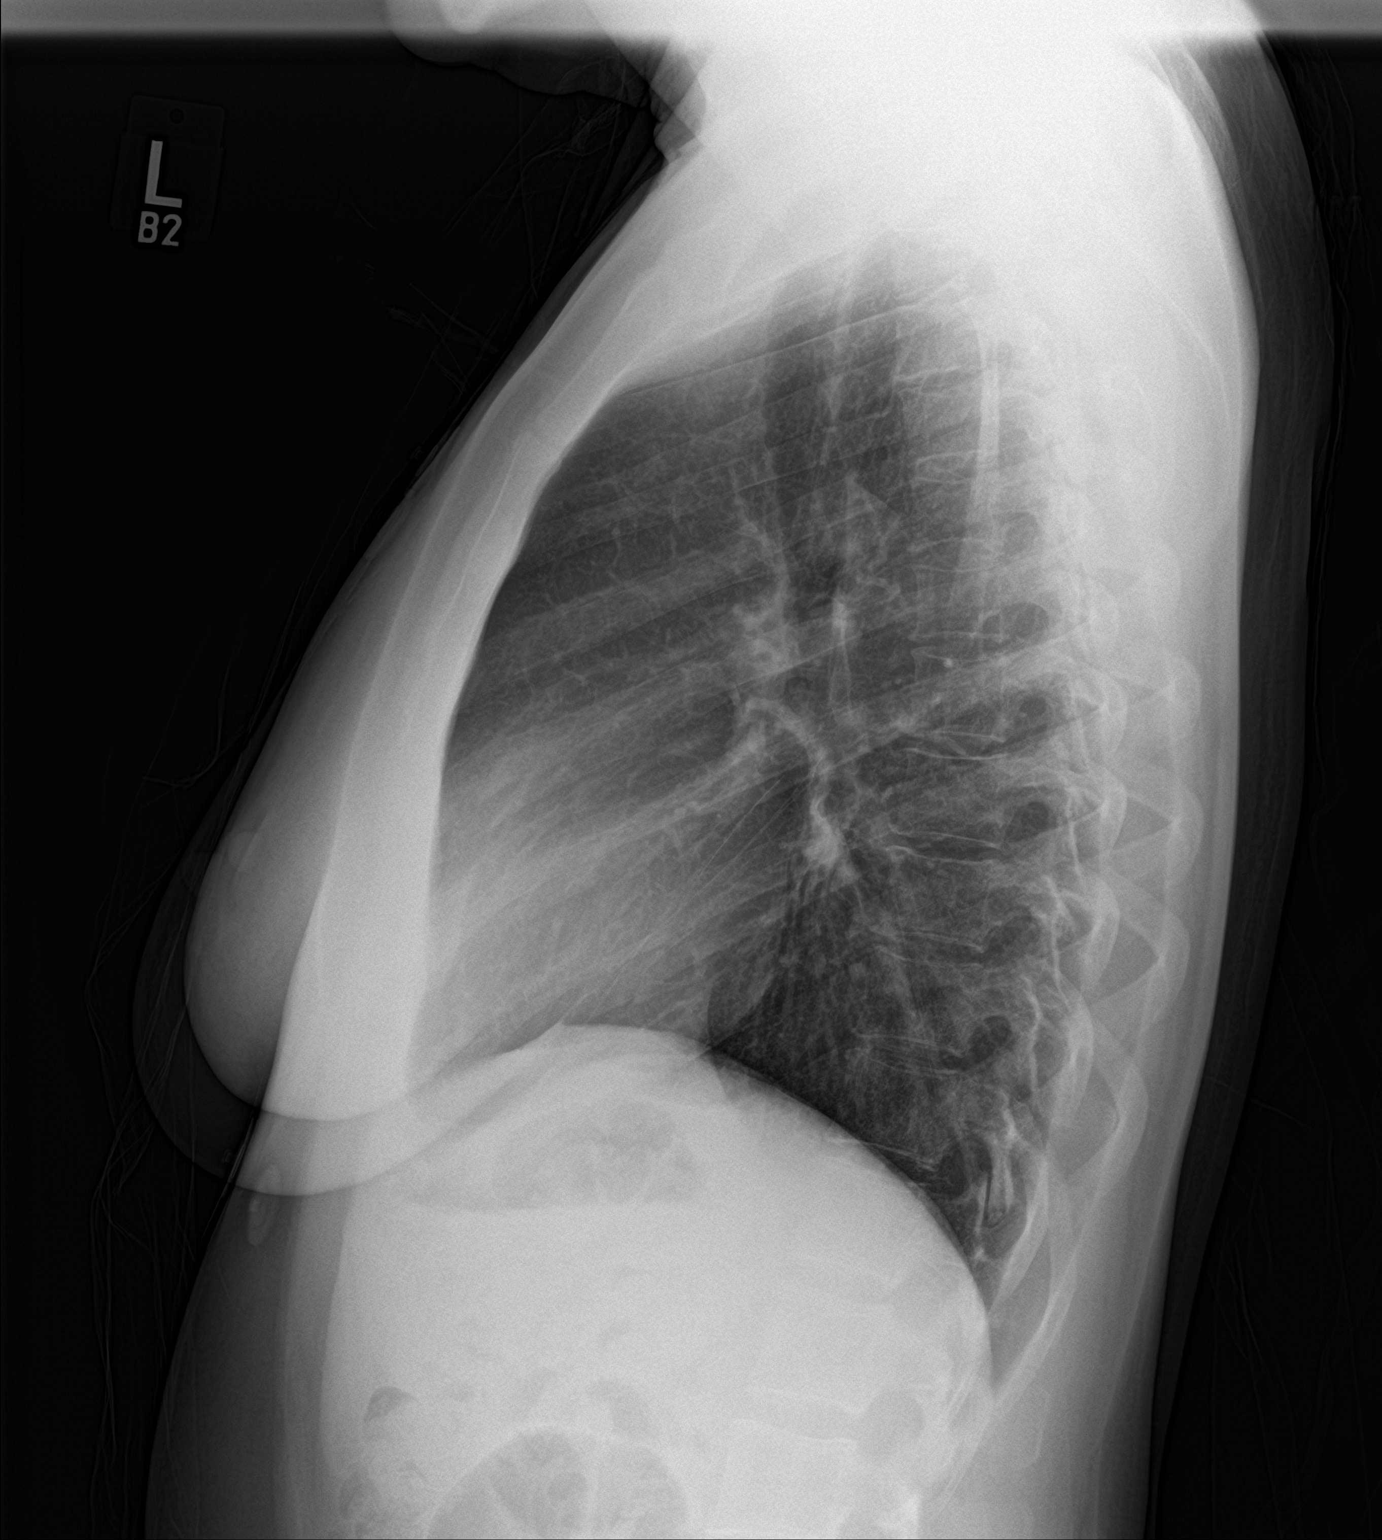

[2 of 2 positions shown; findings below may reference images not displayed]

FINDINGS: Normal heart size, mediastinal contours, and pulmonary vascularity.

Lungs clear.

No pulmonary infiltrate, pleural effusion or pneumothorax.

Bones unremarkable.
IMPRESSION: No acute abnormalities.

## 2018-12-21 DIAGNOSIS — F1020 Alcohol dependence, uncomplicated: Secondary | ICD-10-CM | POA: Diagnosis not present

## 2018-12-22 DIAGNOSIS — F1020 Alcohol dependence, uncomplicated: Secondary | ICD-10-CM | POA: Diagnosis not present

## 2018-12-23 DIAGNOSIS — F1020 Alcohol dependence, uncomplicated: Secondary | ICD-10-CM | POA: Diagnosis not present

## 2018-12-24 DIAGNOSIS — F1020 Alcohol dependence, uncomplicated: Secondary | ICD-10-CM | POA: Diagnosis not present

## 2018-12-25 DIAGNOSIS — F419 Anxiety disorder, unspecified: Secondary | ICD-10-CM | POA: Diagnosis not present

## 2018-12-25 DIAGNOSIS — F5101 Primary insomnia: Secondary | ICD-10-CM | POA: Diagnosis not present

## 2018-12-25 DIAGNOSIS — F1020 Alcohol dependence, uncomplicated: Secondary | ICD-10-CM | POA: Diagnosis not present

## 2018-12-25 DIAGNOSIS — F3289 Other specified depressive episodes: Secondary | ICD-10-CM | POA: Diagnosis not present

## 2018-12-25 DIAGNOSIS — T1490XA Injury, unspecified, initial encounter: Secondary | ICD-10-CM | POA: Diagnosis not present

## 2018-12-26 DIAGNOSIS — F1020 Alcohol dependence, uncomplicated: Secondary | ICD-10-CM | POA: Diagnosis not present

## 2018-12-27 DIAGNOSIS — F1020 Alcohol dependence, uncomplicated: Secondary | ICD-10-CM | POA: Diagnosis not present

## 2018-12-28 DIAGNOSIS — F1020 Alcohol dependence, uncomplicated: Secondary | ICD-10-CM | POA: Diagnosis not present

## 2018-12-29 DIAGNOSIS — F1020 Alcohol dependence, uncomplicated: Secondary | ICD-10-CM | POA: Diagnosis not present

## 2018-12-30 DIAGNOSIS — F1020 Alcohol dependence, uncomplicated: Secondary | ICD-10-CM | POA: Diagnosis not present

## 2018-12-31 DIAGNOSIS — F1020 Alcohol dependence, uncomplicated: Secondary | ICD-10-CM | POA: Diagnosis not present

## 2019-01-01 DIAGNOSIS — F1020 Alcohol dependence, uncomplicated: Secondary | ICD-10-CM | POA: Diagnosis not present

## 2019-01-02 DIAGNOSIS — F1020 Alcohol dependence, uncomplicated: Secondary | ICD-10-CM | POA: Diagnosis not present

## 2019-01-03 DIAGNOSIS — F1020 Alcohol dependence, uncomplicated: Secondary | ICD-10-CM | POA: Diagnosis not present

## 2019-01-03 DIAGNOSIS — F419 Anxiety disorder, unspecified: Secondary | ICD-10-CM | POA: Diagnosis not present

## 2019-01-03 DIAGNOSIS — F3289 Other specified depressive episodes: Secondary | ICD-10-CM | POA: Diagnosis not present

## 2019-01-03 DIAGNOSIS — T1490XA Injury, unspecified, initial encounter: Secondary | ICD-10-CM | POA: Diagnosis not present

## 2019-01-03 DIAGNOSIS — F5101 Primary insomnia: Secondary | ICD-10-CM | POA: Diagnosis not present

## 2019-01-04 DIAGNOSIS — F1020 Alcohol dependence, uncomplicated: Secondary | ICD-10-CM | POA: Diagnosis not present

## 2019-01-05 DIAGNOSIS — F1020 Alcohol dependence, uncomplicated: Secondary | ICD-10-CM | POA: Diagnosis not present

## 2019-01-06 DIAGNOSIS — F1020 Alcohol dependence, uncomplicated: Secondary | ICD-10-CM | POA: Diagnosis not present

## 2019-01-07 DIAGNOSIS — F1020 Alcohol dependence, uncomplicated: Secondary | ICD-10-CM | POA: Diagnosis not present

## 2019-01-08 ENCOUNTER — Other Ambulatory Visit: Payer: Self-pay | Admitting: Neurological Surgery

## 2019-01-08 DIAGNOSIS — D329 Benign neoplasm of meninges, unspecified: Secondary | ICD-10-CM

## 2019-01-08 DIAGNOSIS — F5101 Primary insomnia: Secondary | ICD-10-CM | POA: Diagnosis not present

## 2019-01-08 DIAGNOSIS — F419 Anxiety disorder, unspecified: Secondary | ICD-10-CM | POA: Diagnosis not present

## 2019-01-08 DIAGNOSIS — F3289 Other specified depressive episodes: Secondary | ICD-10-CM | POA: Diagnosis not present

## 2019-01-08 DIAGNOSIS — F1020 Alcohol dependence, uncomplicated: Secondary | ICD-10-CM | POA: Diagnosis not present

## 2019-01-08 DIAGNOSIS — T1490XA Injury, unspecified, initial encounter: Secondary | ICD-10-CM | POA: Diagnosis not present

## 2019-01-30 DIAGNOSIS — F330 Major depressive disorder, recurrent, mild: Secondary | ICD-10-CM | POA: Diagnosis not present

## 2019-01-30 DIAGNOSIS — F5105 Insomnia due to other mental disorder: Secondary | ICD-10-CM | POA: Diagnosis not present

## 2019-01-30 DIAGNOSIS — F4311 Post-traumatic stress disorder, acute: Secondary | ICD-10-CM | POA: Diagnosis not present

## 2019-01-30 DIAGNOSIS — F411 Generalized anxiety disorder: Secondary | ICD-10-CM | POA: Diagnosis not present

## 2019-02-25 ENCOUNTER — Other Ambulatory Visit: Payer: Self-pay

## 2019-02-25 ENCOUNTER — Ambulatory Visit
Admission: RE | Admit: 2019-02-25 | Discharge: 2019-02-25 | Disposition: A | Discharge status: Home / Self Care | Payer: 59 | Source: Ambulatory Visit | Attending: Neurological Surgery | Admitting: Neurological Surgery

## 2019-02-25 DIAGNOSIS — D329 Benign neoplasm of meninges, unspecified: Secondary | ICD-10-CM | POA: Diagnosis not present

## 2019-02-25 MED ORDER — GADOBUTROL 1 MMOL/ML IV SOLN
6.0000 mL | Freq: Once | INTRAVENOUS | Status: AC | PRN
  Administered 2019-02-25: 6 mL via INTRAVENOUS

## 2019-02-26 DIAGNOSIS — D329 Benign neoplasm of meninges, unspecified: Secondary | ICD-10-CM | POA: Diagnosis not present

## 2019-02-26 DIAGNOSIS — G5603 Carpal tunnel syndrome, bilateral upper limbs: Secondary | ICD-10-CM | POA: Diagnosis not present

## 2019-03-01 DIAGNOSIS — F5105 Insomnia due to other mental disorder: Secondary | ICD-10-CM | POA: Diagnosis not present

## 2019-03-01 DIAGNOSIS — F4311 Post-traumatic stress disorder, acute: Secondary | ICD-10-CM | POA: Diagnosis not present

## 2019-03-01 DIAGNOSIS — F330 Major depressive disorder, recurrent, mild: Secondary | ICD-10-CM | POA: Diagnosis not present

## 2019-03-01 DIAGNOSIS — F411 Generalized anxiety disorder: Secondary | ICD-10-CM | POA: Diagnosis not present

## 2019-03-19 DIAGNOSIS — L03213 Periorbital cellulitis: Secondary | ICD-10-CM | POA: Diagnosis not present

## 2019-03-19 DIAGNOSIS — H00012 Hordeolum externum right lower eyelid: Secondary | ICD-10-CM | POA: Diagnosis not present

## 2019-04-17 DIAGNOSIS — F4311 Post-traumatic stress disorder, acute: Secondary | ICD-10-CM | POA: Diagnosis not present

## 2019-04-17 DIAGNOSIS — F5105 Insomnia due to other mental disorder: Secondary | ICD-10-CM | POA: Diagnosis not present

## 2019-04-17 DIAGNOSIS — F411 Generalized anxiety disorder: Secondary | ICD-10-CM | POA: Diagnosis not present

## 2019-04-17 DIAGNOSIS — F330 Major depressive disorder, recurrent, mild: Secondary | ICD-10-CM | POA: Diagnosis not present

## 2019-06-10 DIAGNOSIS — F411 Generalized anxiety disorder: Secondary | ICD-10-CM | POA: Diagnosis not present

## 2019-06-10 DIAGNOSIS — F4311 Post-traumatic stress disorder, acute: Secondary | ICD-10-CM | POA: Diagnosis not present

## 2019-06-10 DIAGNOSIS — F330 Major depressive disorder, recurrent, mild: Secondary | ICD-10-CM | POA: Diagnosis not present

## 2019-06-10 DIAGNOSIS — F5105 Insomnia due to other mental disorder: Secondary | ICD-10-CM | POA: Diagnosis not present

## 2019-07-17 ENCOUNTER — Other Ambulatory Visit: Payer: Self-pay

## 2019-07-17 ENCOUNTER — Ambulatory Visit (INDEPENDENT_AMBULATORY_CARE_PROVIDER_SITE_OTHER): Payer: 59 | Admitting: Family Medicine

## 2019-07-17 ENCOUNTER — Other Ambulatory Visit (HOSPITAL_COMMUNITY)
Admission: RE | Admit: 2019-07-17 | Discharge: 2019-07-17 | Disposition: A | Discharge status: Home / Self Care | Payer: 59 | Source: Ambulatory Visit | Attending: Family Medicine | Admitting: Family Medicine

## 2019-07-17 ENCOUNTER — Encounter: Payer: Self-pay | Admitting: Family Medicine

## 2019-07-17 VITALS — BP 103/70 | HR 67 | Temp 97.7°F | Resp 16 | Ht 65.0 in | Wt 135.0 lb

## 2019-07-17 DIAGNOSIS — M7711 Lateral epicondylitis, right elbow: Secondary | ICD-10-CM

## 2019-07-17 DIAGNOSIS — Z0000 Encounter for general adult medical examination without abnormal findings: Secondary | ICD-10-CM | POA: Diagnosis not present

## 2019-07-17 DIAGNOSIS — Z1231 Encounter for screening mammogram for malignant neoplasm of breast: Secondary | ICD-10-CM

## 2019-07-17 DIAGNOSIS — Z1272 Encounter for screening for malignant neoplasm of vagina: Secondary | ICD-10-CM

## 2019-07-17 DIAGNOSIS — F331 Major depressive disorder, recurrent, moderate: Secondary | ICD-10-CM | POA: Diagnosis not present

## 2019-07-17 HISTORY — DX: Lateral epicondylitis, right elbow: M77.11

## 2019-07-17 MED ORDER — MELOXICAM 15 MG PO TABS: 15.0000 mg | ORAL_TABLET | Freq: Every day | ORAL | 1 refills | Status: DC

## 2019-07-17 NOTE — Assessment & Plan Note (Signed)
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
## 2019-07-17 NOTE — Patient Instructions (Signed)
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
## 2019-07-18 ENCOUNTER — Telehealth: Payer: Self-pay

## 2019-07-18 LAB — CBC WITH DIFFERENTIAL/PLATELET
Basophils Absolute: 0.1 10*3/uL (ref 0.0–0.2)
Basos: 1 %
EOS (ABSOLUTE): 0.2 10*3/uL (ref 0.0–0.4)
Eos: 4 %
Hematocrit: 38.7 % (ref 34.0–46.6)
Hemoglobin: 12.9 g/dL (ref 11.1–15.9)
Immature Grans (Abs): 0 10*3/uL (ref 0.0–0.1)
Immature Granulocytes: 0 %
Lymphocytes Absolute: 1.9 10*3/uL (ref 0.7–3.1)
Lymphs: 44 %
MCH: 31.7 pg (ref 26.6–33.0)
MCHC: 33.3 g/dL (ref 31.5–35.7)
MCV: 95 fL (ref 79–97)
Monocytes Absolute: 0.4 10*3/uL (ref 0.1–0.9)
Monocytes: 8 %
Neutrophils Absolute: 1.9 10*3/uL (ref 1.4–7.0)
Neutrophils: 43 %
Platelets: 304 10*3/uL (ref 150–450)
RBC: 4.07 x10E6/uL (ref 3.77–5.28)
RDW: 11.9 % (ref 11.7–15.4)
WBC: 4.4 10*3/uL (ref 3.4–10.8)

## 2019-07-18 LAB — LIPID PANEL WITH LDL/HDL RATIO
Cholesterol, Total: 177 mg/dL (ref 100–199)
HDL: 84 mg/dL (ref >39)
LDL Chol Calc (NIH): 84 mg/dL (ref 0–99)
LDL/HDL Ratio: 1 ratio (ref 0.0–3.2)
Triglycerides: 43 mg/dL (ref 0–149)
VLDL Cholesterol Cal: 9 mg/dL (ref 5–40)

## 2019-07-18 LAB — COMPREHENSIVE METABOLIC PANEL
ALT: 6 IU/L (ref 0–32)
AST: 15 IU/L (ref 0–40)
Albumin/Globulin Ratio: 2.8 — ABNORMAL HIGH (ref 1.2–2.2)
Albumin: 4.7 g/dL (ref 3.8–4.8)
Alkaline Phosphatase: 75 IU/L (ref 39–117)
BUN/Creatinine Ratio: 19 (ref 9–23)
BUN: 14 mg/dL (ref 6–24)
Bilirubin Total: 0.2 mg/dL (ref 0.0–1.2)
CO2: 22 mmol/L (ref 20–29)
Calcium: 9.6 mg/dL (ref 8.7–10.2)
Chloride: 104 mmol/L (ref 96–106)
Creatinine, Ser: 0.74 mg/dL (ref 0.57–1.00)
GFR calc Af Amer: 114 mL/min/{1.73_m2} (ref >59)
GFR calc non Af Amer: 99 mL/min/{1.73_m2} (ref >59)
Globulin, Total: 1.7 g/dL (ref 1.5–4.5)
Glucose: 86 mg/dL (ref 65–99)
Potassium: 4.4 mmol/L (ref 3.5–5.2)
Sodium: 141 mmol/L (ref 134–144)
Total Protein: 6.4 g/dL (ref 6.0–8.5)

## 2019-07-18 LAB — TSH: TSH: 1.63 u[IU]/mL (ref 0.450–4.500)

## 2019-07-18 NOTE — Telephone Encounter (Signed)
LMTCB 07/18/2019.  PEC please advised pt of lab results below.   Thanks,   -Mickel Baas

## 2019-07-18 NOTE — Telephone Encounter (Signed)
Pt returned office call, advised pt of the message below. Pt expressed understanding.

## 2019-07-18 NOTE — Telephone Encounter (Signed)
-----   Message from Virginia Crews, MD sent at 07/18/2019  1:18 PM EDT ----- Normal labs

## 2019-07-19 ENCOUNTER — Telehealth: Payer: Self-pay

## 2019-07-19 LAB — CYTOLOGY - PAP
Comment: NEGATIVE
Diagnosis: NEGATIVE
High risk HPV: NEGATIVE

## 2019-07-19 NOTE — Telephone Encounter (Signed)
07/19/2019 Tried calling patient. Left message to call back. OK for PEC to advise patient if labs and pap results below when she returns call. ----------  R. Josiah Lobo, CMA          Virginia Crews, MD  07/19/2019 10:42 AM EDT    Normal pap smear and negative HPV. Repeat in 5 years   Virginia Crews, MD  07/18/2019 1:18 PM EDT    Normal labs

## 2019-07-19 NOTE — Telephone Encounter (Signed)
-----   Message from Virginia Crews, MD sent at 07/19/2019 10:42 AM EDT ----- Normal pap smear and negative HPV.  Repeat in 5 years

## 2019-07-22 NOTE — Telephone Encounter (Signed)
Pt given results per Dr Brita Romp; she verbalized understanding.

## 2019-07-31 ENCOUNTER — Encounter: Payer: Self-pay | Admitting: *Deleted

## 2019-07-31 ENCOUNTER — Emergency Department
Admission: EM | Admit: 2019-07-31 | Discharge: 2019-07-31 | Disposition: A | Discharge status: Home / Self Care | Payer: PRIVATE HEALTH INSURANCE | Attending: Emergency Medicine | Admitting: Emergency Medicine

## 2019-07-31 ENCOUNTER — Other Ambulatory Visit: Payer: Self-pay

## 2019-07-31 ENCOUNTER — Emergency Department: Payer: PRIVATE HEALTH INSURANCE

## 2019-07-31 DIAGNOSIS — Y92239 Unspecified place in hospital as the place of occurrence of the external cause: Secondary | ICD-10-CM | POA: Diagnosis not present

## 2019-07-31 DIAGNOSIS — Y93F2 Activity, caregiving, lifting: Secondary | ICD-10-CM | POA: Insufficient documentation

## 2019-07-31 DIAGNOSIS — M25511 Pain in right shoulder: Secondary | ICD-10-CM | POA: Diagnosis not present

## 2019-07-31 DIAGNOSIS — Y990 Civilian activity done for income or pay: Secondary | ICD-10-CM | POA: Diagnosis not present

## 2019-07-31 DIAGNOSIS — X500XXA Overexertion from strenuous movement or load, initial encounter: Secondary | ICD-10-CM | POA: Insufficient documentation

## 2019-07-31 DIAGNOSIS — I10 Essential (primary) hypertension: Secondary | ICD-10-CM | POA: Insufficient documentation

## 2019-07-31 MED ORDER — KETOROLAC TROMETHAMINE 30 MG/ML IJ SOLN
30.0000 mg | Freq: Once | INTRAMUSCULAR | Status: AC
  Administered 2019-07-31: 30 mg via INTRAMUSCULAR
  Filled 2019-07-31: qty 1

## 2019-07-31 MED ORDER — KETOROLAC TROMETHAMINE 10 MG PO TABS: 10.0000 mg | ORAL_TABLET | Freq: Four times a day (QID) | ORAL | 0 refills | Status: AC | PRN

## 2019-07-31 NOTE — ED Notes (Signed)
Pt has requested medication for pain. NP/PA notified

## 2019-07-31 NOTE — ED Notes (Signed)
Urine drug test WC collected for [facility].

## 2019-07-31 NOTE — ED Provider Notes (Signed)
Emergency Department Provider Note  ____________________________________________  Time seen: Approximately 11:50 PM  I have reviewed the triage vital signs and the nursing notes.   HISTORY  Chief Complaint Shoulder Pain   Historian Patient     HPI Carol Mooney is a 44 y.o. female presents to the emergency department with acute right shoulder pain.  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  She states that she experienced a tingling sensation that radiated down her arm.  She has had difficulty abducting the shoulder since injury occurred.  No similar injuries in the past.   Past Medical History:  Diagnosis Date  . Allergy   . Anxiety   . Complication of anesthesia    nausea and vomiting  . Depression   . Hypertension   . Meningioma ([place]) 08/14/2017  . PONV (postoperative nausea and vomiting)      Immunizations up to date:  Yes.     Past Medical History:  Diagnosis Date  . Allergy   . Anxiety   . Complication of anesthesia    nausea and vomiting  . Depression   . Hypertension   . Meningioma ([place]) 08/14/2017  . PONV (postoperative nausea and vomiting)     Patient Active Problem List   Diagnosis Date Noted  . Lateral epicondylitis of right elbow 07/17/2019  . Meningioma ([place]) 08/14/2017  . Depression, major, recurrent, moderate ([employer]) 03/22/2017  . History of MRSA infection 02/11/2016  . Endometriosis 09/01/2015    Past Surgical History:  Procedure Laterality Date  . c sections    . HYSTERECTOMY ABDOMINAL WITH SALPINGECTOMY Bilateral 12/07/2015   Procedure: HYSTERECTOMY ABDOMINAL WITH SALPINGECTOMY/EXCISION OF ENDOMETRIOS;  Surgeon: Boykin Nearing, MD;  Location: [facility] ORS;  Service: Gynecology;  Laterality: Bilateral;  . OOPHORECTOMY Bilateral 12/07/2015   Procedure: OOPHORECTOMY;  Surgeon: Boykin Nearing, MD;  Location: [facility] ORS;  Service: Gynecology;  Laterality: Bilateral;  . TUBAL LIGATION  2000  . UTERINE  STENT PLACEMENT Bilateral 12/07/2015   Procedure: UTERINE STENT PLACEMENT/REMOVAL;  Surgeon: Boykin Nearing, MD;  Location: [facility] ORS;  Service: Gynecology;  Laterality: Bilateral;    Prior to Admission medications   Medication Sig Start Date End Date Taking? Authorizing Provider  acetaminophen (TYLENOL) 325 MG tablet Take 650 mg by mouth every 6 (six) hours as needed.    [provider]  DULoxetine (CYMBALTA) 30 MG capsule Take 1 capsule by mouth daily. 07/31/17   [provider]  DULoxetine (CYMBALTA) 60 MG capsule Take 60 mg by mouth daily. 06/10/19   [provider]  hydrOXYzine (ATARAX/VISTARIL) 50 MG tablet Take 0.5 tablets (25 mg total) by mouth 2 (two) times daily as needed for itching. 08/15/17   Loletha Grayer, MD  ketorolac (TORADOL) 10 MG tablet Take 1 tablet (10 mg total) by mouth every 6 (six) hours as needed for up to 5 days. 07/31/19 08/05/19  Lannie Fields, PA-C  meloxicam (MOBIC) 15 MG tablet Take 1 tablet (15 mg total) by mouth daily. 07/17/19   Virginia Crews, MD  polyethylene glycol (MIRALAX / Floria Raveling) packet Take 17 g by mouth daily.    [provider]  propranolol (INDERAL) 20 MG tablet Take 10-20 mg by mouth 3 (three) times daily as needed for anxiety.    [provider]  senna (SENOKOT) 8.6 MG TABS tablet Take 1 tablet by mouth daily as needed for mild constipation.    [provider]  sertraline (ZOLOFT) 50 MG tablet  Take 50 mg by mouth daily. 04/17/19   [provider]  traZODone (DESYREL) 100 MG tablet Take 100 mg by mouth at bedtime as needed.  08/17/17   [provider]    Allergies Morphine and Morphine and related  Family History  Problem Relation Age of Onset  . Coronary artery disease Father   . Depression Father   . Diabetes Father        insulin dependent  . Depression Brother   . Cancer Maternal Grandmother        breast  . Breast cancer Maternal Grandmother 41  .  Depression Sister   . Breast cancer Paternal Grandmother 52    Social History Social History   Tobacco Use  . Smoking status: Former Smoker    Packs/day: 1.00    Types: Cigarettes    Quit date: 05/20/2019    Years since quitting: 0.1  . Smokeless tobacco: Never Used  Substance Use Topics  . Alcohol use: Yes    Alcohol/week: 2.0 standard drinks    Types: 2 Shots of liquor per week    Comment: very rarely  . Drug use: No     Review of Systems  Constitutional: No fever/chills Eyes:  No discharge ENT: No upper respiratory complaints. Respiratory: no cough. No SOB/ use of accessory muscles to breath Gastrointestinal:   No nausea, no vomiting.  No diarrhea.  No constipation. Musculoskeletal: Patient has right shoulder pain.  Skin: Negative for rash, abrasions, lacerations, ecchymosis.   ____________________________________________   PHYSICAL EXAM:  VITAL SIGNS: ED Triage Vitals [07/31/19 2109]  Enc Vitals Group     BP 111/62     Pulse Rate 87     Resp 18     Temp 98.7 F (37.1 C)     Temp Source Oral     SpO2 98 %     Weight 135 lb (61.2 kg)     Height 5\' 3"  (1.6 m)     Head Circumference      Peak Flow      Pain Score 6     Pain Loc      Pain Edu?      Excl. in [place]?      Constitutional: Alert and oriented. Well appearing and in no acute distress. Eyes: Conjunctivae are normal. PERRL. EOMI. Head: Atraumatic. Cardiovascular: Normal rate, regular rhythm. Normal S1 and S2.  Good peripheral circulation. Respiratory: Normal respiratory effort without tachypnea or retractions. Lungs CTAB. Good air entry to the bases with no decreased or absent breath sounds Gastrointestinal: Bowel sounds x 4 quadrants. Soft and nontender to palpation. No guarding or rigidity. No distention. Musculoskeletal: Patient has pain with right rotator cuff testing but no weakness.  No tenderness to palpation of the right clavicle or the right AC joint. Neurologic:  Normal for age. No gross  focal neurologic deficits are appreciated.  Skin:  Skin is warm, dry and intact. No rash noted. Psychiatric: Mood and affect are normal for age. Speech and behavior are normal.   ____________________________________________   LABS (all labs ordered are listed, but only abnormal results are displayed)  Labs Reviewed - No data to display ____________________________________________  EKG   ____________________________________________  RADIOLOGY Unk Pinto, personally viewed and evaluated these images (plain radiographs) as part of my medical decision making, as well as reviewing the written report by the radiologist.  DG Shoulder Right  Result Date: 07/31/2019 CLINICAL DATA:  Right shoulder pain EXAM: RIGHT SHOULDER - 2+ VIEW  COMPARISON:  None. FINDINGS: There is no evidence of fracture or dislocation. There is no evidence of arthropathy or other focal bone abnormality. Soft tissues are unremarkable. IMPRESSION: Negative. Electronically Signed   By: Donavan Foil M.D.   On: 07/31/2019 22:29    ____________________________________________    PROCEDURES  Procedure(s) performed:     Procedures     Medications  ketorolac (TORADOL) 30 MG/ML injection 30 mg (30 mg Intramuscular Given 07/31/19 2244)     ____________________________________________   INITIAL IMPRESSION / ASSESSMENT AND PLAN / ED COURSE  Pertinent labs & imaging results that were available during my care of the patient were reviewed by me and considered in my medical decision making (see chart for details).      Assessment and plan Right shoulder pain 44 year old female presents to the emergency department with acute right shoulder pain after patient was lifting a patient felt a pop.  X-ray examination reveals no bony abnormality.  Patient did have pain with right rotator cuff testing.  Recommended Toradol over the next 5 days and ice application at home.  She was advised to follow-up with  orthopedics.  Return precautions were given to return with new or worsening symptoms.    ____________________________________________  FINAL CLINICAL IMPRESSION(S) / ED DIAGNOSES  Final diagnoses:  Acute pain of right shoulder      NEW MEDICATIONS STARTED DURING THIS VISIT:  ED Discharge Orders         Ordered    ketorolac (TORADOL) 10 MG tablet  Every 6 hours PRN     07/31/19 2302              This chart was dictated using voice recognition software/Dragon. Despite best efforts to proofread, errors can occur which can change the meaning. Any change was purely unintentional.     Lannie Fields, PA-C 07/31/19 2352    Harvest Dark, MD 08/03/19 [phone]

## 2019-07-31 NOTE — ED Triage Notes (Signed)
Pt has right shoulder pain.  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  Pt has pain and tingling in right shoulder and arm   States WC.  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

## 2019-09-12 DIAGNOSIS — F411 Generalized anxiety disorder: Secondary | ICD-10-CM | POA: Diagnosis not present

## 2019-09-12 DIAGNOSIS — F5105 Insomnia due to other mental disorder: Secondary | ICD-10-CM | POA: Diagnosis not present

## 2019-09-12 DIAGNOSIS — F4311 Post-traumatic stress disorder, acute: Secondary | ICD-10-CM | POA: Diagnosis not present

## 2019-09-12 DIAGNOSIS — F330 Major depressive disorder, recurrent, mild: Secondary | ICD-10-CM | POA: Diagnosis not present

## 2019-09-17 DIAGNOSIS — M25511 Pain in right shoulder: Secondary | ICD-10-CM | POA: Diagnosis not present

## 2019-10-30 NOTE — Progress Notes (Signed)
Established patient visit   Patient: Carol Mooney   DOB: 1976-01-10   44 y.o. Female  MRN: [id number] Visit Date: 10/31/2019  Today's healthcare provider: Trinna Post, PA-C   Chief Complaint  Patient presents with  . Rash  I,Porsha C McClurkin,acting as a scribe for Trinna Post, PA-C.,have documented all relevant documentation on the behalf of Trinna Post, PA-C,as directed by  Trinna Post, PA-C while in the presence of Trinna Post, PA-C.  Subjective    Rash This is a new problem. The current episode started 1 to 4 weeks ago. The problem has been gradually worsening since onset. The affected locations include the abdomen, right buttock, left buttock and chest. The rash is characterized by itchiness, redness and dryness. She was exposed to nothing. Pertinent negatives include no congestion, cough, diarrhea, fatigue or fever. Past treatments include antihistamine and anti-itch cream. The treatment provided mild relief.  Patient reports se had a headache for 10 days now. Patient reports that she has taking BC powder.         Medications: Outpatient Medications Prior to Visit  Medication Sig  . acetaminophen (TYLENOL) 325 MG tablet Take 650 mg by mouth every 6 (six) hours as needed.  . DULoxetine (CYMBALTA) 30 MG capsule Take 1 capsule by mouth daily.  . DULoxetine (CYMBALTA) 60 MG capsule Take 60 mg by mouth daily.  . hydrOXYzine (ATARAX/VISTARIL) 50 MG tablet Take 0.5 tablets (25 mg total) by mouth 2 (two) times daily as needed for itching.  . meloxicam (MOBIC) 15 MG tablet Take 1 tablet (15 mg total) by mouth daily.  . polyethylene glycol (MIRALAX / GLYCOLAX) packet Take 17 g by mouth daily.  . propranolol (INDERAL) 20 MG tablet Take 10-20 mg by mouth 3 (three) times daily as needed for anxiety.  . senna (SENOKOT) 8.6 MG TABS tablet Take 1 tablet by mouth daily as needed for mild constipation.  . sertraline (ZOLOFT) 50 MG tablet Take 50 mg by mouth  daily.  . traZODone (DESYREL) 100 MG tablet Take 100 mg by mouth at bedtime as needed.    No facility-administered medications prior to visit.    Review of Systems  Constitutional: Negative for fatigue and fever.  HENT: Negative for congestion.   Respiratory: Negative for cough.   Gastrointestinal: Negative for diarrhea.  Skin: Positive for rash.  Neurological: Positive for headaches.      Objective    BP 122/74 (BP Location: Left Arm, Patient Position: Sitting, Cuff Size: Normal)   Pulse 71   Temp (!) 97.1 F (36.2 C) (Temporal)   Wt 152 lb 9.6 oz (69.2 kg)   LMP 10/11/2015   SpO2 99%   BMI 27.03 kg/m    Physical Exam Constitutional:      Appearance: Normal appearance.  Skin:    General: Skin is warm and dry.     Findings: Rash present.     Comments: Papular rash on abdomen.   Neurological:     General: No focal deficit present.     Mental Status: She is alert and oriented to person, place, and time.  Psychiatric:        Mood and Affect: Mood normal.        Behavior: Behavior normal.       No results found for any visits on 10/31/19.  Assessment & Plan    1. Rash Patient has a rash to her abdomen, chest and bilateral buttocks for about 3 weeks  now. Patient denies any change in laundry detergent, soaps, plants contact or medications. Patient was prescribed prednisone as below for possible allergic reaction. Return precautions counseled.  - predniSONE (DELTASONE) 10 MG tablet; Take taper doses 6 on day 1, 5 on day 2, 4 on day 3 and etc.  Dispense: 21 tablet; Refill: 0 - mupirocin ointment (BACTROBAN) 2 %; Apply 1 application topically 2 (two) times daily.  Dispense: 22 g; Refill: 0   Return if symptoms worsen or fail to improve.      ITrinna Post, PA-C, have reviewed all documentation for this visit. The documentation on 11/05/19 for the exam, diagnosis, procedures, and orders are all accurate and complete.    Paulene Floor  [facility] [phone] (phone) [phone] (fax)  [place]

## 2019-10-31 ENCOUNTER — Ambulatory Visit (INDEPENDENT_AMBULATORY_CARE_PROVIDER_SITE_OTHER): Payer: 59 | Admitting: Physician Assistant

## 2019-10-31 ENCOUNTER — Other Ambulatory Visit: Payer: Self-pay

## 2019-10-31 ENCOUNTER — Encounter: Payer: Self-pay | Admitting: Physician Assistant

## 2019-10-31 VITALS — BP 122/74 | HR 71 | Temp 97.1°F | Wt 152.6 lb

## 2019-10-31 DIAGNOSIS — R21 Rash and other nonspecific skin eruption: Secondary | ICD-10-CM | POA: Diagnosis not present

## 2019-10-31 MED ORDER — MUPIROCIN 2 % EX OINT: 1.0000 "application " | TOPICAL_OINTMENT | Freq: Two times a day (BID) | CUTANEOUS | 0 refills | Status: DC

## 2019-10-31 MED ORDER — PREDNISONE 10 MG PO TABS: ORAL_TABLET | ORAL | 0 refills | Status: DC

## 2019-10-31 NOTE — Patient Instructions (Signed)
Rash, Adult A rash is a change in the color of your skin. A rash can also change the way your skin feels. There are many different conditions and factors that can cause a rash. Some rashes may disappear after a few days, but some may last for a few weeks. Common causes of rashes include:  Viral infections, such as: ? Colds. ? Measles. ? Hand, foot, and mouth disease.  Bacterial infections, such as: ? Scarlet fever. ? Impetigo.  Fungal infections, such as Candida.  Allergic reactions to food, medicines, or skin care products. Follow these instructions at home: The goal of treatment is to stop the itching and keep the rash from spreading. Pay attention to any changes in your symptoms. Follow these instructions to help with your condition: Medicine Take or apply over-the-counter and prescription medicines only as told by your health care provider. These may include:  Corticosteroid creams to treat red or swollen skin.  Anti-itch lotions.  Oral allergy medicines (antihistamines).  Oral corticosteroids for severe symptoms.  Skin care  Apply cool compresses to the affected areas.  Do not scratch or rub your skin.  Avoid covering the rash. Make sure the rash is exposed to air as much as possible. Managing itching and discomfort  Avoid hot showers or baths, which can make itching worse. A cold shower may help.  Try taking a bath with: ? Epsom salts. Follow manufacturer instructions on the packaging. You can get these at your local pharmacy or grocery store. ? Baking soda. Pour a small amount into the bath as told by your health care provider. ? Colloidal oatmeal. Follow manufacturer instructions on the packaging. You can get this at your local pharmacy or grocery store.  Try applying baking soda paste to your skin. Stir water into baking soda until it reaches a paste-like consistency.  Try applying calamine lotion. This is an over-the-counter lotion that helps to relieve  itchiness.  Keep cool and out of the sun. Sweating and being hot can make itching worse. General instructions   Rest as needed.  Drink enough fluid to keep your urine pale yellow.  Wear loose-fitting clothing.  Avoid scented soaps, detergents, and perfumes. Use gentle soaps, detergents, perfumes, and other cosmetic products.  Avoid any substance that causes your rash. Keep a journal to help track what causes your rash. Write down: ? What you eat. ? What cosmetic products you use. ? What you drink. ? What you wear. This includes jewelry.  Keep all follow-up visits as told by your health care provider. This is important. Contact a health care provider if:  You sweat at night.  You lose weight.  You urinate more than normal.  You urinate less than normal, or you notice that your urine is a darker color than usual.  You feel weak.  You vomit.  Your skin or the whites of your eyes look yellow (jaundice).  Your skin: ? Tingles. ? Is numb.  Your rash: ? Does not go away after several days. ? Gets worse.  You are: ? Unusually thirsty. ? More tired than normal.  You have: ? New symptoms. ? Pain in your abdomen. ? A fever. ? Diarrhea. Get help right away if you:  Have a fever and your symptoms suddenly get worse.  Develop confusion.  Have a severe headache or a stiff neck.  Have severe joint pains or stiffness.  Have a seizure.  Develop a rash that covers all or most of your body. The rash may   or may not be painful.  Develop blisters that: ? Are on top of the rash. ? Grow larger or grow together. ? Are painful. ? Are inside your nose or mouth.  Develop a rash that: ? Looks like purple pinprick-sized spots all over your body. ? Has a "bull's eye" or looks like a target. ? Is not related to sun exposure, is red and painful, and causes your skin to peel. Summary  A rash is a change in the color of your skin. Some rashes disappear after a few days,  but some may last for a few weeks.  The goal of treatment is to stop the itching and keep the rash from spreading.  Take or apply over-the-counter and prescription medicines only as told by your health care provider.  Contact a health care provider if you have new or worsening symptoms.  Keep all follow-up visits as told by your health care provider. This is important. This information is not intended to replace advice given to you by your health care provider. Make sure you discuss any questions you have with your health care provider. Document Revised: 07/27/2018 Document Reviewed: 11/06/2017 Elsevier Patient Education  2020 Elsevier Inc.  

## 2019-11-05 DIAGNOSIS — R519 Headache, unspecified: Secondary | ICD-10-CM | POA: Diagnosis not present

## 2019-11-05 DIAGNOSIS — D329 Benign neoplasm of meninges, unspecified: Secondary | ICD-10-CM | POA: Diagnosis not present

## 2019-11-06 ENCOUNTER — Other Ambulatory Visit: Payer: Self-pay | Admitting: Neurosurgery

## 2019-11-06 ENCOUNTER — Other Ambulatory Visit (HOSPITAL_COMMUNITY): Payer: Self-pay | Admitting: Neurosurgery

## 2019-11-06 DIAGNOSIS — D329 Benign neoplasm of meninges, unspecified: Secondary | ICD-10-CM

## 2019-11-25 ENCOUNTER — Other Ambulatory Visit: Payer: Self-pay

## 2019-11-25 ENCOUNTER — Ambulatory Visit
Admission: RE | Admit: 2019-11-25 | Discharge: 2019-11-25 | Disposition: A | Discharge status: Home / Self Care | Payer: 59 | Source: Ambulatory Visit | Attending: Neurosurgery | Admitting: Neurosurgery

## 2019-11-25 DIAGNOSIS — J0120 Acute ethmoidal sinusitis, unspecified: Secondary | ICD-10-CM | POA: Diagnosis not present

## 2019-11-25 DIAGNOSIS — D329 Benign neoplasm of meninges, unspecified: Secondary | ICD-10-CM | POA: Diagnosis not present

## 2019-11-25 DIAGNOSIS — G9389 Other specified disorders of brain: Secondary | ICD-10-CM | POA: Diagnosis not present

## 2019-11-25 DIAGNOSIS — J322 Chronic ethmoidal sinusitis: Secondary | ICD-10-CM | POA: Diagnosis not present

## 2019-11-25 MED ORDER — GADOBUTROL 1 MMOL/ML IV SOLN
6.0000 mL | Freq: Once | INTRAVENOUS | Status: AC | PRN
  Administered 2019-11-25: 6 mL via INTRAVENOUS

## 2019-12-30 ENCOUNTER — Other Ambulatory Visit: Payer: Self-pay | Admitting: Neurology

## 2019-12-30 DIAGNOSIS — D329 Benign neoplasm of meninges, unspecified: Secondary | ICD-10-CM | POA: Diagnosis not present

## 2019-12-30 DIAGNOSIS — G43719 Chronic migraine without aura, intractable, without status migrainosus: Secondary | ICD-10-CM | POA: Diagnosis not present

## 2019-12-30 DIAGNOSIS — F419 Anxiety disorder, unspecified: Secondary | ICD-10-CM | POA: Diagnosis not present

## 2019-12-30 DIAGNOSIS — E559 Vitamin D deficiency, unspecified: Secondary | ICD-10-CM | POA: Diagnosis not present

## 2019-12-30 DIAGNOSIS — F5105 Insomnia due to other mental disorder: Secondary | ICD-10-CM | POA: Diagnosis not present

## 2019-12-30 DIAGNOSIS — F411 Generalized anxiety disorder: Secondary | ICD-10-CM | POA: Diagnosis not present

## 2019-12-30 DIAGNOSIS — F330 Major depressive disorder, recurrent, mild: Secondary | ICD-10-CM | POA: Diagnosis not present

## 2019-12-30 DIAGNOSIS — Z87898 Personal history of other specified conditions: Secondary | ICD-10-CM | POA: Diagnosis not present

## 2019-12-30 DIAGNOSIS — F4311 Post-traumatic stress disorder, acute: Secondary | ICD-10-CM | POA: Diagnosis not present

## 2019-12-30 DIAGNOSIS — E538 Deficiency of other specified B group vitamins: Secondary | ICD-10-CM | POA: Diagnosis not present

## 2020-01-07 DIAGNOSIS — G43719 Chronic migraine without aura, intractable, without status migrainosus: Secondary | ICD-10-CM | POA: Insufficient documentation

## 2020-01-07 DIAGNOSIS — Z87898 Personal history of other specified conditions: Secondary | ICD-10-CM | POA: Insufficient documentation

## 2020-01-29 ENCOUNTER — Other Ambulatory Visit: Payer: Self-pay | Admitting: Oncology

## 2020-01-29 DIAGNOSIS — U071 COVID-19: Secondary | ICD-10-CM

## 2020-01-29 NOTE — Progress Notes (Signed)
I connected by phone with  Carol Mooney now to discuss the potential use of an new treatment for mild to moderate COVID-19 viral infection in non-hospitalized patients.   This patient is a age/sex that meets the FDA criteria for Emergency Use Authorization of casirivimab\imdevimab.  Has a (+) direct SARS-CoV-2 viral test result 1. Has mild or moderate COVID-19  2. Is ? 45 years of age and weighs ? 40 kg 3. Is NOT hospitalized due to COVID-19 4. Is NOT requiring oxygen therapy or requiring an increase in baseline oxygen flow rate due to COVID-19 5. Is within 10 days of symptom onset 6. Has at least one of the high risk factor(s) for progression to severe COVID-19 and/or hospitalization as defined in EUA. Specific high risk criteria : Past Medical History:  Diagnosis Date  . Allergy   . Anxiety   . Complication of anesthesia    nausea and vomiting  . Depression   . Hypertension   . Meningioma ([place]) 08/14/2017  . PONV (postoperative nausea and vomiting)   ?  ?    Symptom onset  01/22/20   I have spoken and communicated the following to the patient or parent/caregiver:   1. FDA has authorized the emergency use of casirivimab\imdevimab for the treatment of mild to moderate COVID-19 in adults and pediatric patients with positive results of direct SARS-CoV-2 viral testing who are 19 years of age and older weighing at least 40 kg, and who are at high risk for progressing to severe COVID-19 and/or hospitalization.   2. The significant known and potential risks and benefits of casirivimab\imdevimab, and the extent to which such potential risks and benefits are unknown.   3. Information on available alternative treatments and the risks and benefits of those alternatives, including clinical trials.   4. Patients treated with casirivimab\imdevimab should continue to self-isolate and use infection control measures (e.g., wear mask, isolate, social distance, avoid sharing personal items, clean and  disinfect "high touch" surfaces, and frequent handwashing) according to CDC guidelines.    5. The patient or parent/caregiver has the option to accept or refuse casirivimab\imdevimab .   After reviewing this information with the patient, The patient agreed to proceed with receiving casirivimab\imdevimab infusion and will be provided a copy of the Fact sheet prior to receiving the infusion.Rulon Abide, AGNP-C [phone] ([place])

## 2020-01-30 ENCOUNTER — Ambulatory Visit (HOSPITAL_COMMUNITY)
Admission: RE | Admit: 2020-01-30 | Discharge: 2020-01-30 | Disposition: A | Discharge status: Home / Self Care | Payer: 59 | Source: Ambulatory Visit | Attending: Pulmonary Disease | Admitting: Pulmonary Disease

## 2020-01-30 DIAGNOSIS — U071 COVID-19: Secondary | ICD-10-CM | POA: Diagnosis not present

## 2020-01-30 MED ORDER — SODIUM CHLORIDE 0.9 % IV SOLN: Freq: Once | INTRAVENOUS | Status: AC

## 2020-01-30 MED ORDER — FAMOTIDINE IN NACL 20-0.9 MG/50ML-% IV SOLN: 20.0000 mg | Freq: Once | INTRAVENOUS | Status: DC | PRN

## 2020-01-30 MED ORDER — METHYLPREDNISOLONE SODIUM SUCC 125 MG IJ SOLR: 125.0000 mg | Freq: Once | INTRAMUSCULAR | Status: DC | PRN

## 2020-01-30 MED ORDER — SODIUM CHLORIDE 0.9 % IV SOLN: INTRAVENOUS | Status: DC | PRN

## 2020-01-30 MED ORDER — DIPHENHYDRAMINE HCL 50 MG/ML IJ SOLN: 50.0000 mg | Freq: Once | INTRAMUSCULAR | Status: DC | PRN

## 2020-01-30 MED ORDER — EPINEPHRINE 0.3 MG/0.3ML IJ SOAJ: 0.3000 mg | Freq: Once | INTRAMUSCULAR | Status: DC | PRN

## 2020-01-30 MED ORDER — ALBUTEROL SULFATE HFA 108 (90 BASE) MCG/ACT IN AERS: 2.0000 | INHALATION_SPRAY | Freq: Once | RESPIRATORY_TRACT | Status: DC | PRN

## 2020-01-30 NOTE — Progress Notes (Signed)
  Diagnosis: COVID-19  Physician: Dr. Joya Gaskins  Procedure: [facility] Med: bamlanivimab\etesevimab infusion - Provided patient with bamlanimivab\etesevimab fact sheet for patients, parents and caregivers prior to infusion.  Complications: No immediate complications noted.  Discharge: Discharged home   Tia Masker 01/30/2020

## 2020-01-30 NOTE — Discharge Instructions (Signed)

## 2020-02-24 ENCOUNTER — Encounter: Payer: Self-pay | Admitting: Family Medicine

## 2020-02-24 ENCOUNTER — Telehealth (INDEPENDENT_AMBULATORY_CARE_PROVIDER_SITE_OTHER): Payer: 59 | Admitting: Family Medicine

## 2020-02-24 DIAGNOSIS — J069 Acute upper respiratory infection, unspecified: Secondary | ICD-10-CM

## 2020-02-24 DIAGNOSIS — J029 Acute pharyngitis, unspecified: Secondary | ICD-10-CM

## 2020-02-24 NOTE — Progress Notes (Signed)
MyChart Video Visit    Virtual Visit via Video Note   This visit type was conducted due to national recommendations for restrictions regarding the COVID-19 Pandemic (e.g. social distancing) in an effort to limit this patient's exposure and mitigate transmission in our community. This patient is at least at moderate risk for complications without adequate follow up. This format is felt to be most appropriate for this patient at this time. Physical exam was limited by quality of the video and audio technology used for the visit.    Patient location: home Provider location: [place] involved in the visit: patient, provider  I discussed the limitations of evaluation and management by telemedicine and the availability of in person appointments. The patient expressed understanding and agreed to proceed.  Patient: Carol Mooney   DOB: [birth year]   44 y.o. Female  MRN: [id number] Visit Date: 02/24/2020  Today's healthcare provider: Lavon Paganini, MD   Chief Complaint  Patient presents with  . 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 Smoking status: Former Smoker    Packs/day: 1.00    Types: Cigarettes    Quit date: 05/20/2019    Years since quitting: 0.7  . Smokeless tobacco: Never Used  Vaping Use  . Vaping Use: Every day  Substance Use Topics  . Alcohol use: Yes    Alcohol/week: 2.0 standard drinks    Types: 2 Shots of liquor per week    Comment: very rarely  . Drug use: No      Medications: Outpatient Medications Prior to Visit  Medication Sig  . acetaminophen (TYLENOL) 325  MG tablet Take 650 mg by mouth every 6 (six) hours as needed.  . DULoxetine (CYMBALTA) 30 MG capsule Take 1 capsule by mouth daily.  . DULoxetine (CYMBALTA) 60 MG capsule Take 60 mg by mouth daily.  . hydrOXYzine (ATARAX/VISTARIL) 50 MG tablet Take 0.5 tablets (25 mg total) by mouth 2 (two) times daily as needed for itching.  . meloxicam (MOBIC) 15 MG tablet Take 1 tablet (15 mg total) by mouth daily.  . mupirocin ointment (BACTROBAN) 2 % Apply 1 application topically 2 (two) times daily.  . polyethylene glycol (MIRALAX / GLYCOLAX) packet Take 17 g by mouth daily.  . predniSONE (DELTASONE) 10 MG tablet Take taper doses 6 on day 1, 5 on day 2, 4 on day 3 and etc.  . propranolol (INDERAL) 20 MG tablet Take 10-20 mg by mouth 3 (three) times daily as needed for anxiety.  . senna (SENOKOT) 8.6 MG TABS tablet Take 1 tablet by mouth daily as needed for mild constipation.  . sertraline (ZOLOFT) 50 MG tablet Take 50 mg by mouth daily.  . traZODone (DESYREL) 100 MG tablet Take 100 mg by mouth at bedtime as needed.    No facility-administered medications prior to visit.    Review of Systems  Constitutional: Positive for activity change and fatigue.  HENT: Positive for congestion, ear pain, postnasal drip, sinus pressure, sinus pain and sore throat.   Respiratory: Negative.   Cardiovascular: Negative.   Skin:  Negative.   Psychiatric/Behavioral: Negative.       Objective    LMP 10/11/2015    Physical Exam Constitutional:      General: She is not in acute distress.    Appearance: Normal appearance. She is not toxic-appearing.  HENT:     Head: Normocephalic and atraumatic.  Pulmonary:     Effort: Pulmonary effort is normal. No respiratory distress.  Neurological:     Mental Status: She is alert and oriented to person, place, and time. Mental status is at baseline.        Assessment & Plan     1. Upper respiratory tract infection, unspecified type 2. Sore throat - symptoms and exam  c/w viral URI - no symptoms of CAP, AOM, bacterial sinusitis - concern for possible strep pharyngitis given lymph node swelling and sore throat - concern for possible influenza - unlikely to be COVID given recent infection - test for flu and strep - treatment pending results - discussed symptomatic management, natural course, and return precautions  - Influenza a and b - Culture, Group A Strep   Return if symptoms worsen or fail to improve.     I discussed the assessment and treatment plan with the patient. The patient was provided an opportunity to ask questions and all were answered. The patient agreed with the plan and demonstrated an understanding of the instructions.   The patient was advised to call back or seek an in-person evaluation if the symptoms worsen or if the condition fails to improve as anticipated.  I, Lavon Paganini, MD, have reviewed all documentation for this visit. The documentation on 02/24/20 for the exam, diagnosis, procedures, and orders are all accurate and complete.   Johanthan Kneeland, Dionne Bucy, MD, MPH [facility]

## 2020-02-26 LAB — INFLUENZA A AND B
Influenza A Ag, EIA: NEGATIVE
Influenza B Ag, EIA: NEGATIVE

## 2020-02-26 LAB — PLEASE NOTE:

## 2020-02-27 LAB — CULTURE, GROUP A STREP

## 2020-02-28 NOTE — Progress Notes (Signed)
Could be if having sinus pressure.  If symptoms last past 7-10 days would consider antibiotic.  If ear pain worsens, could be seen for that as she tested negative for these other things.

## 2020-03-26 DIAGNOSIS — F330 Major depressive disorder, recurrent, mild: Secondary | ICD-10-CM | POA: Diagnosis not present

## 2020-03-26 DIAGNOSIS — F411 Generalized anxiety disorder: Secondary | ICD-10-CM | POA: Diagnosis not present

## 2020-03-26 DIAGNOSIS — F4311 Post-traumatic stress disorder, acute: Secondary | ICD-10-CM | POA: Diagnosis not present

## 2020-03-26 DIAGNOSIS — F5105 Insomnia due to other mental disorder: Secondary | ICD-10-CM | POA: Diagnosis not present

## 2020-06-19 ENCOUNTER — Other Ambulatory Visit: Payer: Self-pay | Admitting: Psychiatry

## 2020-06-19 DIAGNOSIS — F411 Generalized anxiety disorder: Secondary | ICD-10-CM | POA: Diagnosis not present

## 2020-06-19 DIAGNOSIS — F5105 Insomnia due to other mental disorder: Secondary | ICD-10-CM | POA: Diagnosis not present

## 2020-06-19 DIAGNOSIS — F4311 Post-traumatic stress disorder, acute: Secondary | ICD-10-CM | POA: Diagnosis not present

## 2020-06-19 DIAGNOSIS — F330 Major depressive disorder, recurrent, mild: Secondary | ICD-10-CM | POA: Diagnosis not present

## 2020-07-01 DIAGNOSIS — H5213 Myopia, bilateral: Secondary | ICD-10-CM | POA: Diagnosis not present

## 2020-07-17 ENCOUNTER — Encounter: Payer: 59 | Admitting: Family Medicine

## 2020-07-20 ENCOUNTER — Other Ambulatory Visit: Payer: Self-pay

## 2020-07-20 DIAGNOSIS — F4311 Post-traumatic stress disorder, acute: Secondary | ICD-10-CM | POA: Diagnosis not present

## 2020-07-20 DIAGNOSIS — F411 Generalized anxiety disorder: Secondary | ICD-10-CM | POA: Diagnosis not present

## 2020-07-20 DIAGNOSIS — F330 Major depressive disorder, recurrent, mild: Secondary | ICD-10-CM | POA: Diagnosis not present

## 2020-07-20 DIAGNOSIS — F5105 Insomnia due to other mental disorder: Secondary | ICD-10-CM | POA: Diagnosis not present

## 2020-07-20 MED ORDER — DULOXETINE HCL 60 MG PO CPEP
ORAL_CAPSULE | ORAL | 1 refills | Status: DC
Start: 2020-07-20 — End: 2020-12-03
  Filled 2020-07-20: qty 90, 90d supply, fill #0

## 2020-07-20 MED ORDER — SERTRALINE HCL 50 MG PO TABS
ORAL_TABLET | ORAL | 1 refills | Status: DC
Start: 2020-07-20 — End: 2020-12-03
  Filled 2020-07-20 – 2020-10-21 (×2): qty 90, 90d supply, fill #0

## 2020-07-20 MED ORDER — TRAZODONE HCL 50 MG PO TABS
ORAL_TABLET | ORAL | 1 refills | Status: DC
Start: 2020-07-20 — End: 2021-09-29
  Filled 2020-07-20: qty 90, 90d supply, fill #0

## 2020-07-31 ENCOUNTER — Other Ambulatory Visit: Payer: Self-pay

## 2020-08-05 ENCOUNTER — Other Ambulatory Visit: Payer: Self-pay

## 2020-08-05 DIAGNOSIS — B9689 Other specified bacterial agents as the cause of diseases classified elsewhere: Secondary | ICD-10-CM | POA: Diagnosis not present

## 2020-08-05 DIAGNOSIS — Z03818 Encounter for observation for suspected exposure to other biological agents ruled out: Secondary | ICD-10-CM | POA: Diagnosis not present

## 2020-08-05 DIAGNOSIS — J019 Acute sinusitis, unspecified: Secondary | ICD-10-CM | POA: Diagnosis not present

## 2020-08-05 MED ORDER — AZELASTINE HCL 0.1 % NA SOLN
NASAL | 1 refills | Status: DC
Start: 2020-08-05 — End: 2020-12-03
  Filled 2020-08-05: qty 30, 30d supply, fill #0

## 2020-08-05 MED ORDER — FLUCONAZOLE 150 MG PO TABS
ORAL_TABLET | ORAL | 0 refills | Status: DC
Start: 2020-08-05 — End: 2020-12-03
  Filled 2020-08-05: qty 3, 9d supply, fill #0

## 2020-08-05 MED ORDER — CEFDINIR 300 MG PO CAPS
ORAL_CAPSULE | ORAL | 0 refills | Status: DC
Start: 2020-08-05 — End: 2021-02-17
  Filled 2020-08-05: qty 20, 10d supply, fill #0

## 2020-10-16 ENCOUNTER — Other Ambulatory Visit: Payer: Self-pay

## 2020-10-16 ENCOUNTER — Emergency Department: Payer: 59

## 2020-10-16 ENCOUNTER — Emergency Department
Admission: EM | Admit: 2020-10-16 | Discharge: 2020-10-16 | Disposition: A | Discharge status: Home / Self Care | Payer: 59 | Attending: Emergency Medicine | Admitting: Emergency Medicine

## 2020-10-16 DIAGNOSIS — R197 Diarrhea, unspecified: Secondary | ICD-10-CM | POA: Insufficient documentation

## 2020-10-16 DIAGNOSIS — R0789 Other chest pain: Secondary | ICD-10-CM | POA: Insufficient documentation

## 2020-10-16 DIAGNOSIS — R1011 Right upper quadrant pain: Secondary | ICD-10-CM | POA: Insufficient documentation

## 2020-10-16 DIAGNOSIS — Z87891 Personal history of nicotine dependence: Secondary | ICD-10-CM | POA: Insufficient documentation

## 2020-10-16 DIAGNOSIS — I10 Essential (primary) hypertension: Secondary | ICD-10-CM | POA: Diagnosis not present

## 2020-10-16 DIAGNOSIS — R109 Unspecified abdominal pain: Secondary | ICD-10-CM | POA: Diagnosis not present

## 2020-10-16 DIAGNOSIS — R1013 Epigastric pain: Secondary | ICD-10-CM

## 2020-10-16 DIAGNOSIS — Z79899 Other long term (current) drug therapy: Secondary | ICD-10-CM | POA: Diagnosis not present

## 2020-10-16 DIAGNOSIS — R110 Nausea: Secondary | ICD-10-CM | POA: Insufficient documentation

## 2020-10-16 LAB — CBC WITH DIFFERENTIAL/PLATELET
Abs Immature Granulocytes: 0.04 10*3/uL (ref 0.00–0.07)
Basophils Absolute: 0.1 10*3/uL (ref 0.0–0.1)
Basophils Relative: 1 %
Eosinophils Absolute: 0.5 10*3/uL (ref 0.0–0.5)
Eosinophils Relative: 5 %
HCT: 38.3 % (ref 36.0–46.0)
Hemoglobin: 12.7 g/dL (ref 12.0–15.0)
Immature Granulocytes: 0 %
Lymphocytes Relative: 26 %
Lymphs Abs: 2.7 10*3/uL (ref 0.7–4.0)
MCH: 31.8 pg (ref 26.0–34.0)
MCHC: 33.2 g/dL (ref 30.0–36.0)
MCV: 95.8 fL (ref 80.0–100.0)
Monocytes Absolute: 0.7 10*3/uL (ref 0.1–1.0)
Monocytes Relative: 7 %
Neutro Abs: 6.3 10*3/uL (ref 1.7–7.7)
Neutrophils Relative %: 61 %
Platelets: 294 10*3/uL (ref 150–400)
RBC: 4 MIL/uL (ref 3.87–5.11)
RDW: 11.9 % (ref 11.5–15.5)
WBC: 10.3 10*3/uL (ref 4.0–10.5)
nRBC: 0 % (ref 0.0–0.2)

## 2020-10-16 LAB — COMPREHENSIVE METABOLIC PANEL
ALT: 12 U/L (ref 0–44)
AST: 18 U/L (ref 15–41)
Albumin: 4 g/dL (ref 3.5–5.0)
Alkaline Phosphatase: 56 U/L (ref 38–126)
Anion gap: 5 (ref 5–15)
BUN: 17 mg/dL (ref 6–20)
CO2: 26 mmol/L (ref 22–32)
Calcium: 9.1 mg/dL (ref 8.9–10.3)
Chloride: 110 mmol/L (ref 98–111)
Creatinine, Ser: 0.64 mg/dL (ref 0.44–1.00)
GFR, Estimated: >60 mL/min (ref >60)
Glucose, Bld: 106 mg/dL — ABNORMAL HIGH (ref 70–99)
Potassium: 3.8 mmol/L (ref 3.5–5.1)
Sodium: 141 mmol/L (ref 135–145)
Total Bilirubin: 0.5 mg/dL (ref 0.3–1.2)
Total Protein: 6.3 g/dL — ABNORMAL LOW (ref 6.5–8.1)

## 2020-10-16 LAB — LIPASE, BLOOD: Lipase: 36 U/L (ref 11–51)

## 2020-10-16 LAB — TROPONIN I (HIGH SENSITIVITY): Troponin I (High Sensitivity): <2 ng/L (ref <18)

## 2020-10-16 MED ORDER — LIDOCAINE VISCOUS HCL 2 % MT SOLN
15.0000 mL | Freq: Once | OROMUCOSAL | Status: AC
  Administered 2020-10-16: 15 mL via ORAL
  Filled 2020-10-16: qty 15

## 2020-10-16 MED ORDER — SODIUM CHLORIDE 0.9 % IV SOLN: INTRAVENOUS | Status: DC

## 2020-10-16 MED ORDER — ONDANSETRON HCL 4 MG/2ML IJ SOLN
4.0000 mg | Freq: Once | INTRAMUSCULAR | Status: AC
  Administered 2020-10-16: 4 mg via INTRAVENOUS
  Filled 2020-10-16: qty 2

## 2020-10-16 MED ORDER — LIDOCAINE VISCOUS HCL 2 % MT SOLN
15.0000 mL | OROMUCOSAL | 0 refills | Status: DC | PRN
  Filled 2020-10-16: qty 150, 5d supply, fill #0

## 2020-10-16 MED ORDER — ONDANSETRON 4 MG PO TBDP
4.0000 mg | ORAL_TABLET | Freq: Four times a day (QID) | ORAL | 0 refills | Status: DC | PRN
  Filled 2020-10-16: qty 20, 5d supply, fill #0

## 2020-10-16 MED ORDER — FENTANYL CITRATE (PF) 100 MCG/2ML IJ SOLN
50.0000 ug | Freq: Once | INTRAMUSCULAR | Status: AC
  Administered 2020-10-16: 50 ug via INTRAVENOUS
  Filled 2020-10-16: qty 2

## 2020-10-16 MED ORDER — PANTOPRAZOLE SODIUM 40 MG PO TBEC
40.0000 mg | DELAYED_RELEASE_TABLET | Freq: Every day | ORAL | 1 refills | Status: DC
  Filled 2020-10-16: qty 30, 30d supply, fill #0
  Filled 2021-01-03 – 2021-01-04 (×2): qty 30, 30d supply, fill #1

## 2020-10-16 MED ORDER — DIPHENHYDRAMINE HCL 50 MG/ML IJ SOLN
25.0000 mg | Freq: Once | INTRAMUSCULAR | Status: AC
  Administered 2020-10-16: 25 mg via INTRAVENOUS
  Filled 2020-10-16: qty 1

## 2020-10-16 MED ORDER — ALUM & MAG HYDROXIDE-SIMETH 200-200-20 MG/5ML PO SUSP
30.0000 mL | Freq: Once | ORAL | Status: AC
  Administered 2020-10-16: 30 mL via ORAL
  Filled 2020-10-16: qty 30

## 2020-10-16 MED ORDER — PANTOPRAZOLE SODIUM 40 MG IV SOLR
40.0000 mg | Freq: Once | INTRAVENOUS | Status: AC
  Administered 2020-10-16: 40 mg via INTRAVENOUS
  Filled 2020-10-16: qty 40

## 2020-10-16 NOTE — ED Notes (Signed)
ED Provider at bedside. 

## 2020-10-16 NOTE — ED Notes (Signed)
Patient transported to X-ray 

## 2020-10-16 NOTE — ED Notes (Signed)
Ultrasound at bedside

## 2020-10-16 NOTE — ED Triage Notes (Addendum)
Pt presents to the ER with complaints of epigastric pain onset a few hours ago. She note pain radiates to her right shoulder. Denies previous sx in the past. Reports intermittent Chest discomfort. Denies cardiac hx.

## 2020-10-16 NOTE — ED Provider Notes (Signed)
[facility] Emergency Department Provider Note  ____________________________________________   Event Date/Time   First MD Initiated Contact with Patient 10/16/20 0421     (approximate)  I have reviewed the triage vital signs and the nursing notes.   HISTORY  Chief Complaint Abdominal Pain    HPI Carol Mooney is a 45 y.o. female with history of previous hysterectomy and bilateral salpingo-oophorectomy, C-section x2 who presents to the emergency department with complaints of right upper abdominal pain that is achy, severe that radiates into the right chest and right upper arm.  Pain worse after eating pasta salad while at work tonight.  States symptoms progressively worsening throughout the day.  No shortness of breath.  Has had nausea without vomiting.  Had some diarrhea today.  No bloody stools, melena.  No history of biliary colic.  No personal history of CAD but does have a strong family history.  No history of PE, DVT, exogenous estrogen use, recent fractures, surgery, trauma, hospitalization, prolonged travel or other immobilization. No lower extremity swelling or pain. No calf tenderness.         Past Medical History:  Diagnosis Date   Allergy    Anxiety    Complication of anesthesia    nausea and vomiting   Depression    Hypertension    Meningioma ([place]) 08/14/2017   PONV (postoperative nausea and vomiting)     Patient Active Problem List   Diagnosis Date Noted   Lateral epicondylitis of right elbow 07/17/2019   Meningioma ([place]) 08/14/2017   Depression, major, recurrent, moderate ([place]) 03/22/2017   History of MRSA infection 02/11/2016   Endometriosis 09/01/2015    Past Surgical History:  Procedure Laterality Date   c sections     HYSTERECTOMY ABDOMINAL WITH SALPINGECTOMY Bilateral 12/07/2015   Procedure: HYSTERECTOMY ABDOMINAL WITH SALPINGECTOMY/EXCISION OF ENDOMETRIOS;  Surgeon: Boykin Nearing, MD;  Location: [facility] ORS;  Service:  Gynecology;  Laterality: Bilateral;   OOPHORECTOMY Bilateral 12/07/2015   Procedure: OOPHORECTOMY;  Surgeon: Boykin Nearing, MD;  Location: [facility] ORS;  Service: Gynecology;  Laterality: Bilateral;   TUBAL LIGATION  2000   UTERINE STENT PLACEMENT Bilateral 12/07/2015   Procedure: UTERINE STENT PLACEMENT/REMOVAL;  Surgeon: Boykin Nearing, MD;  Location: [facility] ORS;  Service: Gynecology;  Laterality: Bilateral;    Prior to Admission medications   Medication Sig Start Date End Date Taking? Authorizing Provider  lidocaine (XYLOCAINE) 2 % solution Use as directed 15 mLs in the mouth or throat as needed for mouth pain. 10/16/20  Yes Tari Lecount N, DO  ondansetron (ZOFRAN ODT) 4 MG disintegrating tablet Take 1 tablet (4 mg total) by mouth every 6 (six) hours as needed for nausea or vomiting. 10/16/20  Yes Kirstina Leinweber, Cyril Mourning N, DO  pantoprazole (PROTONIX) 40 MG tablet Take 1 tablet (40 mg total) by mouth daily. 10/16/20 10/16/21 Yes Meylin Stenzel, Delice Bison, DO  acetaminophen (TYLENOL) 325 MG tablet Take 650 mg by mouth every 6 (six) hours as needed.    [provider]  azelastine (ASTELIN) 0.1 % nasal spray Place 1 spray into both nostrils 2 (two) times daily 08/05/20     baclofen (LIORESAL) 10 MG tablet TAKE 1 TABLET BY MOUTH TWICE DAILY AS NEEDED 12/30/19 12/29/20  Vladimir Crofts, MD  cefdinir (OMNICEF) 300 MG capsule Take 1 capsule (300 mg total) by mouth 2 (two) times daily for 10 days 08/05/20     DULoxetine (CYMBALTA) 30 MG capsule Take 1 capsule by mouth daily. 07/31/17  [provider]  DULoxetine (CYMBALTA) 30 MG capsule TAKE 1 CAPSULE BY MOUTH DAILY AT 8:01AM 12/30/19 12/29/20  Chauncey Mann, MD  DULoxetine (CYMBALTA) 60 MG capsule Take 60 mg by mouth daily. 06/10/19   [provider]  DULoxetine (CYMBALTA) 60 MG capsule TAKE 1 CAPSULE BY MOUTH DAILY AT 8:00AM 06/19/20 06/19/21  Chauncey Mann, MD  DULoxetine (CYMBALTA) 60 MG capsule Take 1 capsule by mouth daily at 8:00am 07/20/20      fluconazole (DIFLUCAN) 150 MG tablet Take 1 tablet (150 mg total) by mouth once for 1 dose May repeat x1 if symptoms recur after 3 days 08/05/20     hydrOXYzine (ATARAX/VISTARIL) 50 MG tablet Take 0.5 tablets (25 mg total) by mouth 2 (two) times daily as needed for itching. 08/15/17   Loletha Grayer, MD  meloxicam (MOBIC) 15 MG tablet Take 1 tablet (15 mg total) by mouth daily. 07/17/19   Virginia Crews, MD  mupirocin ointment (BACTROBAN) 2 % Apply 1 application topically 2 (two) times daily. 10/31/19   Trinna Post, PA-C  polyethylene glycol (MIRALAX / GLYCOLAX) packet Take 17 g by mouth daily.    [provider]  predniSONE (DELTASONE) 10 MG tablet Take taper doses 6 on day 1, 5 on day 2, 4 on day 3 and etc. 10/31/19   Trinna Post, PA-C  propranolol (INDERAL) 20 MG tablet Take 10-20 mg by mouth 3 (three) times daily as needed for anxiety.    [provider]  senna (SENOKOT) 8.6 MG TABS tablet Take 1 tablet by mouth daily as needed for mild constipation.    [provider]  sertraline (ZOLOFT) 50 MG tablet Take 50 mg by mouth daily. 04/17/19   [provider]  sertraline (ZOLOFT) 50 MG tablet TAKE 1 TABLET BY MOUTH DAILY WITH BREAKFAST 06/19/20 06/19/21  Chauncey Mann, MD  sertraline (ZOLOFT) 50 MG tablet Take 1 tablet by mouth daily with breakfast 07/20/20     topiramate (TOPAMAX) 50 MG tablet TAKE 1 TABLET BY MOUTH TWICE DAILY 12/30/19 12/29/20  Vladimir Crofts, MD  traZODone (DESYREL) 100 MG tablet Take 100 mg by mouth at bedtime as needed.  08/17/17   [provider]  traZODone (DESYREL) 50 MG tablet Take 1 tablet by mouth at bedtime as needed 07/20/20       Allergies Morphine and Morphine and related  Family History  Problem Relation Age of Onset   Coronary artery disease Father    Depression Father    Diabetes Father        insulin dependent   Depression Brother    Cancer Maternal Grandmother        breast   Breast cancer Maternal  Grandmother 32   Depression Sister    Breast cancer Paternal Grandmother 82    Social History Social History   Tobacco Use   Smoking status: Former    Packs/day: 1.00    Pack years: 0.00    Types: Cigarettes    Quit date: 05/20/2019    Years since quitting: 1.4   Smokeless tobacco: Never  Vaping Use   Vaping Use: Every day  Substance Use Topics   Alcohol use: Not Currently    Alcohol/week: 2.0 standard drinks    Types: 2 Shots of liquor per week    Comment: very rarely   Drug use: No    Review of Systems Constitutional: No fever. Eyes: No visual changes. ENT: No sore throat. Cardiovascular: + right sided chest  pain. Respiratory: Denies shortness of breath. Gastrointestinal: No vomiting, diarrhea. Genitourinary: Negative for dysuria. Musculoskeletal: Negative for back pain. Skin: Negative for rash. Neurological: Negative for focal weakness or numbness.  ____________________________________________   PHYSICAL EXAM:  VITAL SIGNS: Today's Vitals   10/16/20 0530 10/16/20 0545 10/16/20 0600 10/16/20 0630  BP: (!) 101/59  109/65 108/63  Pulse: (!) 58  (!) 52 (!) 54  Resp: 14  13 12   Temp:      TempSrc:      SpO2: 96%  99% 99%  Weight:      Height:      PainSc:  7     HR 70 Body mass index is 26.63 kg/m.  CONSTITUTIONAL: Alert and oriented and responds appropriately to questions.  Appears uncomfortable but afebrile, nontoxic HEAD: Normocephalic EYES: Conjunctivae clear, pupils appear equal, EOM appear intact ENT: normal nose; moist mucous membranes NECK: Supple, normal ROM CARD: RRR; S1 and S2 appreciated; no murmurs, no clicks, no rubs, no gallops RESP: Normal chest excursion without splinting or tachypnea; breath sounds clear and equal bilaterally; no wheezes, no rhonchi, no rales, no hypoxia or respiratory distress, speaking full sentences ABD/GI: Normal bowel sounds; non-distended; soft, tender to palpation in the right upper quadrant, no guarding or  rebound, negative Murphy sign, no tenderness at McBurney's point BACK: The back appears normal EXT: Normal ROM in all joints; no deformity noted, no edema; no cyanosis, no calf tenderness or calf swelling SKIN: Normal color for age and race; warm; no rash on exposed skin NEURO: Moves all extremities equally PSYCH: The patient's mood and manner are appropriate.  ____________________________________________   LABS (all labs ordered are listed, but only abnormal results are displayed)  Labs Reviewed  COMPREHENSIVE METABOLIC PANEL - Abnormal; Notable for the following components:      Result Value   Glucose, Bld 106 (*)    Total Protein 6.3 (*)    All other components within normal limits  CBC WITH DIFFERENTIAL/PLATELET  LIPASE, BLOOD  TROPONIN I (HIGH SENSITIVITY)   ____________________________________________  EKG   Date: 10/16/2020 4:43 AM  Rate: 76  Rhythm: normal sinus rhythm  QRS Axis: normal  Intervals: normal  ST/T Wave abnormalities: normal  Conduction Disutrbances: none  Narrative Interpretation: unremarkable    ____________________________________________  RADIOLOGY I, Diogenes Whirley, personally viewed and evaluated these images (plain radiographs) as part of my medical decision making, as well as reviewing the written report by the radiologist.  ED MD interpretation: Chest x-ray clear.  Right upper quadrant ultrasound shows no acute abnormality.  Official radiology report(s): DG Chest 2 View  Result Date: 10/16/2020 CLINICAL DATA:  45 year old female with chest and epigastric pain. Pain radiating to the right shoulder. Former smoker. EXAM: CHEST - 2 VIEW COMPARISON:  Chest radiographs 12/12/2017 and earlier. FINDINGS: Larger lung volumes but lung volumes and mediastinal contours remain normal. Visualized tracheal air column is within normal limits. Both lungs appear clear aside from borderline to mild increased interstitial markings. No pneumothorax or pleural  effusion. Paucity of bowel gas in the upper abdomen which appears negative. Mild scoliosis. Otherwise no osseous abnormality identified. IMPRESSION: No acute cardiopulmonary abnormality. Electronically Signed   By: Genevie Ann M.D.   On: 10/16/2020 05:29   US Abdomen Limited RUQ (LIVER/GB)  Result Date: 10/16/2020 CLINICAL DATA:  Right upper quadrant abdominal pain EXAM: ULTRASOUND ABDOMEN LIMITED RIGHT UPPER QUADRANT COMPARISON:  None. FINDINGS: Gallbladder: The gallbladder appears contracted. No gallstones or wall thickening visualized. No sonographic Murphy sign noted by sonographer.  Common bile duct: Diameter: 4-6 mm in mid diameter Liver: No focal lesion identified. Within normal limits in parenchymal echogenicity. Portal vein is patent on color Doppler imaging with normal direction of blood flow towards the liver. Other: None. IMPRESSION: Normal examination. Electronically Signed   By: Fidela Salisbury MD   On: 10/16/2020 06:10    ____________________________________________   PROCEDURES  Procedure(s) performed (including Critical Care):  Procedures    ____________________________________________   INITIAL IMPRESSION / ASSESSMENT AND PLAN / ED COURSE  As part of my medical decision making, I reviewed the following data within the [place] notes reviewed and incorporated, Labs reviewed , EKG interpreted , Old EKG reviewed, Old chart reviewed, Radiograph reviewed , and Notes from prior ED visits         Patient here with upper abdominal pain on the right side as well as atypical right-sided chest pain.  I suspect that this may be due to biliary colic versus cholecystitis, choledocholithiasis, pancreatitis.  GERD, gastritis also on the differential.  Less likely ACS but patient does have a strong family history.  Will obtain EKG, troponin, chest x-ray.  Abdominal labs, right upper quadrant ultrasound also ordered.  Will give IV fluids, pain and nausea medicine.   Doubt PE, dissection.  ED PROGRESS  Patient's work-up thus far has been unremarkable.  No leukocytosis.  Normal electrolytes, creatinine, LFTs, lipase.  Negative troponin.  She is PERC negative.  EKG nonischemic.  Chest x-ray clear.  Right upper quadrant ultrasound unremarkable.  Pain is mostly in the upper abdomen.  I suspect that this is possible gastritis given otherwise reassuring work-up.  She reports feeling better after fentanyl.  Will give Protonix, GI cocktail and p.o. challenge.  She is comfortable with this plan.  7:23 AM  Pt reports pain has significantly improved.  Tolerating p.o.  Will discharge with Protonix, viscous lidocaine, Zofran.  Will give GI follow-up information.  Recommended bland diet for the next several days.  Patient comfortable with this plan.  At this time, I do not feel there is any life-threatening condition present. I have reviewed, interpreted and discussed all results (EKG, imaging, lab, urine as appropriate) and exam findings with patient/family. I have reviewed nursing notes and appropriate previous records.  I feel the patient is safe to be discharged home without further emergent workup and can continue workup as an outpatient as needed. Discussed usual and customary return precautions. Patient/family verbalize understanding and are comfortable with this plan.  Outpatient follow-up has been provided as needed. All questions have been answered.  ____________________________________________   FINAL CLINICAL IMPRESSION(S) / ED DIAGNOSES  Final diagnoses:  RUQ abdominal pain  Epigastric pain  Atypical chest pain     ED Discharge Orders          Ordered    pantoprazole (PROTONIX) 40 MG tablet  Daily        10/16/20 0726    ondansetron (ZOFRAN ODT) 4 MG disintegrating tablet  Every 6 hours PRN        10/16/20 0726    lidocaine (XYLOCAINE) 2 % solution  As needed        10/16/20 9937            *Please note:  Carol Mooney was evaluated in  Emergency Department on 10/16/2020 for the symptoms described in the history of present illness. She was evaluated in the context of the global COVID-19 pandemic, which necessitated consideration that the patient might be at risk  for infection with the SARS-CoV-2 virus that causes COVID-19. Institutional protocols and algorithms that pertain to the evaluation of patients at risk for COVID-19 are in a state of rapid change based on information released by regulatory bodies including the CDC and federal and state organizations. These policies and algorithms were followed during the patient's care in the ED.  Some ED evaluations and interventions may be delayed as a result of limited staffing during and the pandemic.*   Note:  This document was prepared using Dragon voice recognition software and may include unintentional dictation errors.    Gregg Winchell, Delice Bison, DO 10/16/20 [phone]

## 2020-10-16 NOTE — Discharge Instructions (Addendum)
Please avoid NSAIDs such as aspirin (Goody powders), ibuprofen (Motrin, Advil), naproxen (Aleve) as these may worsen your symptoms.  Tylenol 1000 mg every 6 hours is safe to take as long as you have no history of liver problems (heavy alcohol use, cirrhosis, hepatitis).  Please avoid spicy, acidic (citrus fruits, tomato based sauces, salsa), greasy, fatty foods.  Please avoid caffeine and alcohol.  Smoking can also make GERD/acid reflux worse.  Over the counter medications such as TUMS, Maalox or Mylanta, pepcid, Prilosec or Nexium may help with your symptoms.  Do not take Prilosec or Nexium if you are already prescribed a proton pump inhibitor.  

## 2020-10-21 ENCOUNTER — Other Ambulatory Visit: Payer: Self-pay

## 2020-10-21 MED FILL — Duloxetine HCl Enteric Coated Pellets Cap 30 MG (Base Eq): ORAL | 90 days supply | Qty: 90 | Fill #0 | Status: AC

## 2020-10-27 ENCOUNTER — Other Ambulatory Visit: Payer: Self-pay

## 2020-10-27 ENCOUNTER — Encounter: Payer: Self-pay | Admitting: Emergency Medicine

## 2020-10-27 ENCOUNTER — Ambulatory Visit
Admission: EM | Admit: 2020-10-27 | Discharge: 2020-10-27 | Disposition: A | Discharge status: Home / Self Care | Payer: 59 | Attending: Emergency Medicine | Admitting: Emergency Medicine

## 2020-10-27 DIAGNOSIS — J069 Acute upper respiratory infection, unspecified: Secondary | ICD-10-CM

## 2020-10-27 MED ORDER — IPRATROPIUM BROMIDE 0.06 % NA SOLN
2.0000 | Freq: Four times a day (QID) | NASAL | 12 refills | Status: DC
  Filled 2020-10-27: qty 15, 20d supply, fill #0
  Filled 2021-01-03 – 2021-01-04 (×2): qty 15, 20d supply, fill #1

## 2020-10-27 MED ORDER — BENZONATATE 100 MG PO CAPS
200.0000 mg | ORAL_CAPSULE | Freq: Three times a day (TID) | ORAL | 0 refills | Status: DC
Start: 2020-10-27 — End: 2020-12-03
  Filled 2020-10-27: qty 21, 3d supply, fill #0

## 2020-10-27 MED ORDER — PROMETHAZINE-DM 6.25-15 MG/5ML PO SYRP
5.0000 mL | ORAL_SOLUTION | Freq: Four times a day (QID) | ORAL | 0 refills | Status: DC | PRN
  Filled 2020-10-27: qty 118, 6d supply, fill #0

## 2020-10-27 NOTE — Discharge Instructions (Signed)

## 2020-10-27 NOTE — ED Triage Notes (Signed)
Sore throat, headache, body aches, nasal congestion started Friday. 2 negative home covid tests.

## 2020-10-27 NOTE — ED Provider Notes (Signed)
[facility]    CSN: [id number] Arrival date & time: 10/27/20  0930      History   Chief Complaint Chief Complaint  Patient presents with   Sore Throat   Headache   Nasal Congestion    HPI Carol Mooney is a 45 y.o. female.   HPI  45 year old female here for evaluation of upper respiratory complaints.  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  She is also had associated symptoms of left ear pressure, runny nose, and a nonproductive cough.  She did have diarrhea during the first 2 days of her illness but that has since resolved.  She still continues to have a decreased appetite.  She denies any nausea or vomiting, shortness of breath or wheezing.  Patient has been vaccinating his COVID but has not received a booster shot.  She is unaware of any sick contacts.  Past Medical History:  Diagnosis Date   Allergy    Anxiety    Complication of anesthesia    nausea and vomiting   Depression    Hypertension    Meningioma ([place]) 08/14/2017   PONV (postoperative nausea and vomiting)     Patient Active Problem List   Diagnosis Date Noted   Lateral epicondylitis of right elbow 07/17/2019   Meningioma ([place]) 08/14/2017   Depression, major, recurrent, moderate ([place]) 03/22/2017   History of MRSA infection 02/11/2016   Endometriosis 09/01/2015    Past Surgical History:  Procedure Laterality Date   c sections     HYSTERECTOMY ABDOMINAL WITH SALPINGECTOMY Bilateral 12/07/2015   Procedure: HYSTERECTOMY ABDOMINAL WITH SALPINGECTOMY/EXCISION OF ENDOMETRIOS;  Surgeon: Boykin Nearing, MD;  Location: [facility];  Service: Gynecology;  Laterality: Bilateral;   OOPHORECTOMY Bilateral 12/07/2015   Procedure: OOPHORECTOMY;  Surgeon: Boykin Nearing, MD;  Location: [facility];  Service: Gynecology;  Laterality: Bilateral;   TUBAL LIGATION   2000   UTERINE STENT PLACEMENT Bilateral 12/07/2015   Procedure: UTERINE STENT PLACEMENT/REMOVAL;  Surgeon: Boykin Nearing, MD;  Location: [facility];  Service: Gynecology;  Laterality: Bilateral;    OB History   No obstetric history on file.      Home Medications    Prior to Admission medications   Medication Sig Start Date End Date Taking? Authorizing Provider  baclofen (LIORESAL) 10 MG tablet TAKE 1 TABLET BY MOUTH TWICE DAILY AS NEEDED 12/30/19 12/29/20 Yes Vladimir Crofts, MD  benzonatate (TESSALON) 100 MG capsule Take 2 capsules (200 mg total) by mouth every 8 (eight) hours. 10/27/20  Yes Margarette Canada, NP  DULoxetine (CYMBALTA) 60 MG capsule Take 1 capsule by mouth daily at 8:00am 07/20/20  Yes   ipratropium (ATROVENT) 0.06 % nasal spray Place 2 sprays into both nostrils 4 (four) times daily. 10/27/20  Yes Margarette Canada, NP  ondansetron (ZOFRAN ODT) 4 MG disintegrating tablet Take 1 tablet (4 mg total) by mouth every 6 (six) hours as needed for nausea or vomiting. 10/16/20  Yes Ward, Cyril Mourning N, DO  pantoprazole (PROTONIX) 40 MG tablet Take 1 tablet (40 mg total) by mouth daily. 10/16/20 10/16/21 Yes Ward, Kristen N, DO  promethazine-dextromethorphan (PROMETHAZINE-DM) 6.25-15 MG/5ML syrup Take 5 mLs by mouth 4 (four) times daily as needed. 10/27/20  Yes Margarette Canada, NP  sertraline (ZOLOFT) 50 MG tablet Take 1 tablet by mouth daily with breakfast 07/20/20  Yes  traZODone (DESYREL) 50 MG tablet Take 1 tablet by mouth at bedtime as needed 07/20/20  Yes   acetaminophen (TYLENOL) 325 MG tablet Take 650 mg by mouth every 6 (six) hours as needed.    [provider]  azelastine (ASTELIN) 0.1 % nasal spray Place 1 spray into both nostrils 2 (two) times daily 08/05/20     cefdinir (OMNICEF) 300 MG capsule Take 1 capsule (300 mg total) by mouth 2 (two) times daily for 10 days 08/05/20     DULoxetine (CYMBALTA) 30 MG capsule Take 1 capsule by mouth daily. 07/31/17   [provider]  DULoxetine  (CYMBALTA) 30 MG capsule TAKE 1 CAPSULE BY MOUTH DAILY AT 8:01AM 12/30/19 01/20/21  Chauncey Mann, MD  DULoxetine (CYMBALTA) 60 MG capsule Take 60 mg by mouth daily. 06/10/19   [provider]  DULoxetine (CYMBALTA) 60 MG capsule TAKE 1 CAPSULE BY MOUTH DAILY AT 8:00AM 06/19/20 06/19/21  Chauncey Mann, MD  fluconazole (DIFLUCAN) 150 MG tablet Take 1 tablet (150 mg total) by mouth once for 1 dose May repeat x1 if symptoms recur after 3 days 08/05/20     hydrOXYzine (ATARAX/VISTARIL) 50 MG tablet Take 0.5 tablets (25 mg total) by mouth 2 (two) times daily as needed for itching. 08/15/17   Loletha Grayer, MD  lidocaine (XYLOCAINE) 2 % solution Use as directed 15 mLs in the mouth or throat as needed for mouth pain. 10/16/20   Ward, Delice Bison, DO  meloxicam (MOBIC) 15 MG tablet Take 1 tablet (15 mg total) by mouth daily. 07/17/19   Virginia Crews, MD  mupirocin ointment (BACTROBAN) 2 % Apply 1 application topically 2 (two) times daily. 10/31/19   Trinna Post, PA-C  polyethylene glycol (MIRALAX / GLYCOLAX) packet Take 17 g by mouth daily.    [provider]  predniSONE (DELTASONE) 10 MG tablet Take taper doses 6 on day 1, 5 on day 2, 4 on day 3 and etc. 10/31/19   Trinna Post, PA-C  propranolol (INDERAL) 20 MG tablet Take 10-20 mg by mouth 3 (three) times daily as needed for anxiety.    [provider]  senna (SENOKOT) 8.6 MG TABS tablet Take 1 tablet by mouth daily as needed for mild constipation.    [provider]  sertraline (ZOLOFT) 50 MG tablet Take 50 mg by mouth daily. 04/17/19   [provider]  sertraline (ZOLOFT) 50 MG tablet TAKE 1 TABLET BY MOUTH DAILY WITH BREAKFAST 06/19/20 06/19/21  Chauncey Mann, MD  topiramate (TOPAMAX) 50 MG tablet TAKE 1 TABLET BY MOUTH TWICE DAILY 12/30/19 12/29/20  Vladimir Crofts, MD  traZODone (DESYREL) 100 MG tablet Take 100 mg by mouth at bedtime as needed.  08/17/17   [provider]    Family  History Family History  Problem Relation Age of Onset   Coronary artery disease Father    Depression Father    Diabetes Father        insulin dependent   Depression Brother    Cancer Maternal Grandmother        breast   Breast cancer Maternal Grandmother 4   Depression Sister    Breast cancer Paternal Grandmother 82    Social History Social History   Tobacco Use   Smoking status: Former    Packs/day: 1.00    Pack years: 0.00    Types: Cigarettes    Quit date: 05/20/2019    Years since quitting: 1.4   Smokeless tobacco:  Never  Vaping Use   Vaping Use: Every day  Substance Use Topics   Alcohol use: Not Currently    Alcohol/week: 2.0 standard drinks    Types: 2 Shots of liquor per week    Comment: very rarely   Drug use: No     Allergies   Morphine and Morphine and related   Review of Systems Review of Systems  Constitutional:  Positive for appetite change and fever.  HENT:  Positive for congestion, ear pain, rhinorrhea and sore throat.   Respiratory:  Positive for cough. Negative for shortness of breath and wheezing.   Gastrointestinal:  Positive for diarrhea. Negative for nausea and vomiting.  Musculoskeletal:  Positive for arthralgias and myalgias.  Skin:  Negative for rash.  Neurological:  Positive for headaches.  Hematological: Negative.   Psychiatric/Behavioral: Negative.      Physical Exam Triage Vital Signs ED Triage Vitals  Enc Vitals Group     BP 10/27/20 0939 130/73     Pulse Rate 10/27/20 0939 82     Resp 10/27/20 0939 16     Temp 10/27/20 0939 98.6 F (37 C)     Temp Source 10/27/20 0939 Oral     SpO2 10/27/20 0939 100 %     Weight --      Height --      Head Circumference --      Peak Flow --      Pain Score 10/27/20 0940 5     Pain Loc --      Pain Edu? --      Excl. in [place]? --    No data found.  Updated Vital Signs BP 130/73   Pulse 82   Temp 98.6 F (37 C) (Oral)   Resp 16   LMP 10/11/2015   SpO2 100%   Visual  Acuity Right Eye Distance:   Left Eye Distance:   Bilateral Distance:    Right Eye Near:   Left Eye Near:    Bilateral Near:     Physical Exam Vitals and nursing note reviewed.  Constitutional:      General: She is not in acute distress.    Appearance: Normal appearance. She is well-developed and normal weight. She is not ill-appearing.  HENT:     Head: Normocephalic and atraumatic.     Right Ear: Tympanic membrane, ear canal and external ear normal. There is no impacted cerumen.     Left Ear: Tympanic membrane, ear canal and external ear normal. There is no impacted cerumen.     Nose: Congestion and rhinorrhea present.     Mouth/Throat:     Mouth: Mucous membranes are moist.     Pharynx: Oropharynx is clear. Posterior oropharyngeal erythema present.  Cardiovascular:     Rate and Rhythm: Normal rate and regular rhythm.     Pulses: Normal pulses.     Heart sounds: Normal heart sounds. No murmur heard.   No gallop.  Pulmonary:     Effort: Pulmonary effort is normal.     Breath sounds: Normal breath sounds. No wheezing, rhonchi or rales.  Musculoskeletal:     Cervical back: Normal range of motion and neck supple.  Lymphadenopathy:     Cervical: Cervical adenopathy present.  Skin:    General: Skin is warm and dry.     Capillary Refill: Capillary refill takes less than 2 seconds.     Findings: No erythema or rash.  Neurological:     General: No focal deficit present.  Mental Status: She is alert and oriented to person, place, and time.  Psychiatric:        Mood and Affect: Mood normal.        Behavior: Behavior normal.        Thought Content: Thought content normal.        Judgment: Judgment normal.     UC Treatments / Results  Labs (all labs ordered are listed, but only abnormal results are displayed) Labs Reviewed - No data to display  EKG   Radiology No results found.  Procedures Procedures (including critical care time)  Medications Ordered in  UC Medications - No data to display  Initial Impression / Assessment and Plan / UC Course  I have reviewed the triage vital signs and the nursing notes.  Pertinent labs & imaging results that were available during my care of the patient were reviewed by me and considered in my medical decision making (see chart for details).  Patient is a very pleasant 45 year old female here for evaluation of respiratory complaints as outlined in HPI above.  Patient's physical exam reveals pearly gray tympanic membranes bilaterally with a normal light reflex and clear external auditory canals.  Nasal mucosa is erythematous and edematous with clear nasal discharge.  Oropharyngeal exam reveals benign tonsillar pillars without erythema, edema, or exudate.  Posterior oropharynx does have erythema and mild injection with clear postnasal drip.  Patient also has bilateral anterior cervical lymphadenopathy on exam.  Cardiopulmonary exam is benign.  Patient exam is consistent with a viral URI with cough.  We will treat with Atrovent nasal spray, Tessalon Perles, Promethazine DM cough syrup for cough and congestion.  Also advising rest, Tylenol and ibuprofen for fever, supportive care.  Work note provided.  As patient is 4 days into symptoms and outside the window treatment for flu I will not check her for flu at this time.  She has taken 2 at home COVID test that were both negative but due to duration of symptoms she will be through her quarantine interval tomorrow if she does have COVID I have elected not to swab her for COVID at this time either.   Final Clinical Impressions(s) / UC Diagnoses   Final diagnoses:  Viral URI with cough     Discharge Instructions      Use the Atrovent nasal spray, 2 squirts in each nostril every 6 hours, as needed for runny nose and postnasal drip.  Use the Tessalon Perles every 8 hours during the day.  Take them with a small sip of water.  They may give you some numbness to the base of  your tongue or a metallic taste in your mouth, this is normal.  Use the Promethazine DM cough syrup at bedtime for cough and congestion.  It will make you drowsy so do not take it during the day.  Return for reevaluation or see your primary care provider for any new or worsening symptoms.      ED Prescriptions     Medication Sig Dispense Auth. Provider   benzonatate (TESSALON) 100 MG capsule Take 2 capsules (200 mg total) by mouth every 8 (eight) hours. 21 capsule Margarette Canada, NP   ipratropium (ATROVENT) 0.06 % nasal spray Place 2 sprays into both nostrils 4 (four) times daily. 15 mL Margarette Canada, NP   promethazine-dextromethorphan (PROMETHAZINE-DM) 6.25-15 MG/5ML syrup Take 5 mLs by mouth 4 (four) times daily as needed. 118 mL Margarette Canada, NP      PDMP not reviewed this  encounter.   Margarette Canada, NP 10/27/20 1004

## 2020-11-26 ENCOUNTER — Other Ambulatory Visit: Payer: Self-pay

## 2020-11-26 DIAGNOSIS — F5105 Insomnia due to other mental disorder: Secondary | ICD-10-CM | POA: Diagnosis not present

## 2020-11-26 DIAGNOSIS — F411 Generalized anxiety disorder: Secondary | ICD-10-CM | POA: Diagnosis not present

## 2020-11-26 DIAGNOSIS — F4311 Post-traumatic stress disorder, acute: Secondary | ICD-10-CM | POA: Diagnosis not present

## 2020-11-26 DIAGNOSIS — F330 Major depressive disorder, recurrent, mild: Secondary | ICD-10-CM | POA: Diagnosis not present

## 2020-11-26 MED ORDER — TRAZODONE HCL 50 MG PO TABS
ORAL_TABLET | ORAL | 1 refills | Status: DC
  Filled 2020-11-26: qty 90, 90d supply, fill #0

## 2020-11-26 MED ORDER — SERTRALINE HCL 50 MG PO TABS
ORAL_TABLET | ORAL | 1 refills | Status: DC
  Filled 2020-11-26: qty 90, 90d supply, fill #0

## 2020-11-26 MED ORDER — DULOXETINE HCL 60 MG PO CPEP
ORAL_CAPSULE | ORAL | 1 refills | Status: DC
  Filled 2020-11-26: qty 90, 90d supply, fill #0

## 2020-12-03 ENCOUNTER — Other Ambulatory Visit: Payer: Self-pay

## 2020-12-03 ENCOUNTER — Encounter: Payer: Self-pay | Admitting: Family Medicine

## 2020-12-03 ENCOUNTER — Ambulatory Visit (INDEPENDENT_AMBULATORY_CARE_PROVIDER_SITE_OTHER): Payer: 59 | Admitting: Family Medicine

## 2020-12-03 VITALS — BP 101/73 | HR 82 | Temp 98.7°F | Resp 16 | Ht 65.0 in | Wt 158.7 lb

## 2020-12-03 DIAGNOSIS — R739 Hyperglycemia, unspecified: Secondary | ICD-10-CM

## 2020-12-03 DIAGNOSIS — Z1231 Encounter for screening mammogram for malignant neoplasm of breast: Secondary | ICD-10-CM | POA: Diagnosis not present

## 2020-12-03 DIAGNOSIS — Z1211 Encounter for screening for malignant neoplasm of colon: Secondary | ICD-10-CM | POA: Diagnosis not present

## 2020-12-03 DIAGNOSIS — Z0000 Encounter for general adult medical examination without abnormal findings: Secondary | ICD-10-CM | POA: Diagnosis not present

## 2020-12-03 DIAGNOSIS — Z1159 Encounter for screening for other viral diseases: Secondary | ICD-10-CM

## 2020-12-03 NOTE — Progress Notes (Signed)
Complete physical exam   Patient: Carol Mooney   DOB: 1975/04/21   45 y.o. Female  MRN: [id number] Visit Date: 12/03/2020  Today's healthcare provider: Lavon Paganini, MD   Chief Complaint  Patient presents with   Annual Exam   Subjective     HPI   Carol Mooney is a 45 y.o. female who presents today for a complete physical exam.   She reports consuming a general diet. Home exercise routine includes walking 1 hrs per week. She generally feels well. She reports sleeping well. She does not have additional problems to discuss today.   Screenings  Last Reported Pap-07/17/2019 normal HPV negative Mammogram 03/16/2017 She is scheduled to see [facility] in September however she is requesting for a referral for a colonoscopy.  Vaccines  She has 2 COVID vaccines and eligible for the booster. She is current on other vaccines.   Past Medical History:  Diagnosis Date   Allergy    Anxiety    Complication of anesthesia    nausea and vomiting   Depression    Hypertension    Meningioma ([place]) 08/14/2017   PONV (postoperative nausea and vomiting)    Past Surgical History:  Procedure Laterality Date   c sections     HYSTERECTOMY ABDOMINAL WITH SALPINGECTOMY Bilateral 12/07/2015   Procedure: HYSTERECTOMY ABDOMINAL WITH SALPINGECTOMY/EXCISION OF ENDOMETRIOS;  Surgeon: Boykin Nearing, MD;  Location: [facility];  Service: Gynecology;  Laterality: Bilateral;   OOPHORECTOMY Bilateral 12/07/2015   Procedure: OOPHORECTOMY;  Surgeon: Boykin Nearing, MD;  Location: [facility];  Service: Gynecology;  Laterality: Bilateral;   TUBAL LIGATION  2000   UTERINE STENT PLACEMENT Bilateral 12/07/2015   Procedure: UTERINE STENT PLACEMENT/REMOVAL;  Surgeon: Boykin Nearing, MD;  Location: [facility];  Service: Gynecology;  Laterality: Bilateral;   Social History   Socioeconomic History   Marital status: Married    Spouse name: Not on file   Number of children: Not on file    Years of education: Not on file   Highest education level: Not on file  Occupational History   Not on file  Tobacco Use   Smoking status: Former    Packs/day: 1.00    Types: Cigarettes    Quit date: 05/20/2019    Years since quitting: 1.5   Smokeless tobacco: Never  Vaping Use   Vaping Use: Every day  Substance and Sexual Activity   Alcohol use: Not Currently    Alcohol/week: 2.0 standard drinks    Types: 2 Shots of liquor per week    Comment: very rarely   Drug use: No   Sexual activity: Yes    Birth control/protection: Other-see comments    Comment: hysterectomy  Other Topics Concern   Not on file  Social History Narrative   Not on file   Social Determinants of Health   Financial Resource Strain: Not on file  Food Insecurity: Not on file  Transportation Needs: Not on file  Physical Activity: Not on file  Stress: Not on file  Social Connections: Not on file  Intimate Partner Violence: Not on file   Family Status  Relation Name Status   Father  Alive   Brother  Alive   MGM  Alive   Sister  Alive   Mother  Alive   Sister  Alive   Daughter  Alive   MGF  Deceased   PGM  Deceased   PGF  Deceased       CHF  Family History  Problem Relation Age of Onset   Coronary artery disease Father    Depression Father    Diabetes Father        insulin dependent   Depression Brother    Cancer Maternal Grandmother        breast   Breast cancer Maternal Grandmother 15   Depression Sister    Breast cancer Paternal Grandmother 49   Allergies  Allergen Reactions   Morphine Itching   Morphine And Related Itching    Patient Care Team: Virginia Crews, MD as PCP - General (Family Medicine)   Medications: Outpatient Medications Prior to Visit  Medication Sig   acetaminophen (TYLENOL) 325 MG tablet Take 650 mg by mouth every 6 (six) hours as needed.   baclofen (LIORESAL) 10 MG tablet TAKE 1 TABLET BY MOUTH TWICE DAILY AS NEEDED   cefdinir (OMNICEF) 300 MG capsule  Take 1 capsule (300 mg total) by mouth 2 (two) times daily for 10 days   DULoxetine (CYMBALTA) 60 MG capsule TAKE 1 CAPSULE BY MOUTH DAILY AT 8:00AM (Patient taking differently: Take 60 mg by mouth daily.)   ipratropium (ATROVENT) 0.06 % nasal spray Place 2 sprays into both nostrils 4 (four) times daily.   pantoprazole (PROTONIX) 40 MG tablet Take 1 tablet (40 mg total) by mouth daily.   propranolol (INDERAL) 20 MG tablet Take 10-20 mg by mouth 3 (three) times daily as needed for anxiety.   senna (SENOKOT) 8.6 MG TABS tablet Take 1 tablet by mouth daily as needed for mild constipation.   sertraline (ZOLOFT) 50 MG tablet Take 50 mg by mouth daily.   topiramate (TOPAMAX) 50 MG tablet TAKE 1 TABLET BY MOUTH TWICE DAILY   traZODone (DESYREL) 50 MG tablet Take 1 tablet by mouth at bedtime as needed   [DISCONTINUED] azelastine (ASTELIN) 0.1 % nasal spray Place 1 spray into both nostrils 2 (two) times daily   [DISCONTINUED] benzonatate (TESSALON) 100 MG capsule Take 2 capsules (200 mg total) by mouth every 8 (eight) hours.   [DISCONTINUED] DULoxetine (CYMBALTA) 30 MG capsule Take 1 capsule by mouth daily.   [DISCONTINUED] DULoxetine (CYMBALTA) 30 MG capsule TAKE 1 CAPSULE BY MOUTH DAILY AT 8:01AM   [DISCONTINUED] DULoxetine (CYMBALTA) 60 MG capsule Take 60 mg by mouth daily.   [DISCONTINUED] DULoxetine (CYMBALTA) 60 MG capsule Take 1 capsule by mouth daily at 8:00am   [DISCONTINUED] DULoxetine (CYMBALTA) 60 MG capsule Take 1 capsule by mouth daily at 8:00am   [DISCONTINUED] fluconazole (DIFLUCAN) 150 MG tablet Take 1 tablet (150 mg total) by mouth once for 1 dose May repeat x1 if symptoms recur after 3 days   [DISCONTINUED] hydrOXYzine (ATARAX/VISTARIL) 50 MG tablet Take 0.5 tablets (25 mg total) by mouth 2 (two) times daily as needed for itching.   [DISCONTINUED] lidocaine (XYLOCAINE) 2 % solution Use as directed 15 mLs in the mouth or throat as needed for mouth pain.   [DISCONTINUED] meloxicam (MOBIC)  15 MG tablet Take 1 tablet (15 mg total) by mouth daily.   [DISCONTINUED] mupirocin ointment (BACTROBAN) 2 % Apply 1 application topically 2 (two) times daily.   [DISCONTINUED] ondansetron (ZOFRAN ODT) 4 MG disintegrating tablet Take 1 tablet (4 mg total) by mouth every 6 (six) hours as needed for nausea or vomiting.   [DISCONTINUED] polyethylene glycol (MIRALAX / GLYCOLAX) packet Take 17 g by mouth daily.   [DISCONTINUED] predniSONE (DELTASONE) 10 MG tablet Take taper doses 6 on day 1, 5 on day 2, 4 on day  3 and etc.   [DISCONTINUED] promethazine-dextromethorphan (PROMETHAZINE-DM) 6.25-15 MG/5ML syrup Take 5 mLs by mouth 4 (four) times daily as needed.   [DISCONTINUED] sertraline (ZOLOFT) 50 MG tablet TAKE 1 TABLET BY MOUTH DAILY WITH BREAKFAST   [DISCONTINUED] sertraline (ZOLOFT) 50 MG tablet Take 1 tablet by mouth daily with breakfast   [DISCONTINUED] sertraline (ZOLOFT) 50 MG tablet Take 1 daily with breakfast   [DISCONTINUED] traZODone (DESYREL) 100 MG tablet Take 100 mg by mouth at bedtime as needed.    [DISCONTINUED] traZODone (DESYREL) 50 MG tablet Take 1 tablet by mouth at bedtime as needed   No facility-administered medications prior to visit.    Review of Systems  Constitutional:  Negative for chills, diaphoresis, fatigue and fever.  HENT:  Positive for sinus pressure. Negative for ear pain, sinus pain and sore throat.   Eyes:  Negative for pain and visual disturbance.  Respiratory:  Negative for cough, chest tightness, shortness of breath and wheezing.   Cardiovascular:  Negative for chest pain, palpitations and leg swelling.  Gastrointestinal:  Positive for abdominal pain, constipation and diarrhea. Negative for blood in stool, nausea and vomiting.  Genitourinary:  Negative for dysuria, flank pain, frequency, pelvic pain and urgency.  Musculoskeletal:  Negative for back pain, myalgias and neck pain.  Allergic/Immunologic: Positive for environmental allergies.  Neurological:   Negative for dizziness, seizures, syncope, weakness, light-headedness, numbness and headaches.  All other systems reviewed and are negative.  Last CBC Lab Results  Component Value Date   WBC 10.3 10/16/2020   HGB 12.7 10/16/2020   HCT 38.3 10/16/2020   MCV 95.8 10/16/2020   MCH 31.8 10/16/2020   RDW 11.9 10/16/2020   PLT 294 123456   Last metabolic panel Lab Results  Component Value Date   GLUCOSE 106 (H) 10/16/2020   NA 141 10/16/2020   K 3.8 10/16/2020   CL 110 10/16/2020   CO2 26 10/16/2020   BUN 17 10/16/2020   CREATININE 0.64 10/16/2020   GFRNONAA >60 10/16/2020   GFRAA 114 07/17/2019   CALCIUM 9.1 10/16/2020   PROT 6.3 (L) 10/16/2020   ALBUMIN 4.0 10/16/2020   LABGLOB 1.7 07/17/2019   AGRATIO 2.8 (H) 07/17/2019   BILITOT 0.5 10/16/2020   ALKPHOS 56 10/16/2020   AST 18 10/16/2020   ALT 12 10/16/2020   ANIONGAP 5 10/16/2020   Last lipids Lab Results  Component Value Date   CHOL 177 07/17/2019   HDL 84 07/17/2019   LDLCALC 84 07/17/2019   TRIG 43 07/17/2019   CHOLHDL 2.4 05/17/2017   Last hemoglobin A1c Lab Results  Component Value Date   HGBA1C 5.7 (H) 08/13/2017   Last thyroid functions Lab Results  Component Value Date   TSH 1.630 07/17/2019   Last vitamin D Lab Results  Component Value Date   VD25OH 12.7 (L) 11/11/2014   Last vitamin B12 and Folate Lab Results  Component Value Date   VITAMINB12 361 05/02/2017   FOLATE 8.0 05/02/2017      Objective    BP 101/73 (BP Location: Left Arm, Patient Position: Sitting, Cuff Size: Large)   Pulse 82   Temp 98.7 F (37.1 C) (Oral)   Resp 16   Ht '5\' 5"'$  (1.651 m)   Wt 158 lb 11.2 oz (72 kg)   LMP 10/11/2015   BMI 26.41 kg/m  BP Readings from Last 3 Encounters:  12/03/20 101/73  10/27/20 130/73  10/16/20 103/67   Wt Readings from Last 3 Encounters:  12/03/20 158 lb 11.2 oz (  72 kg)  10/16/20 160 lb (72.6 kg)  10/31/19 152 lb 9.6 oz (69.2 kg)      Physical Exam Vitals reviewed.   Constitutional:      General: She is not in acute distress.    Appearance: Normal appearance. She is well-developed. She is not diaphoretic.  HENT:     Head: Normocephalic and atraumatic.     Right Ear: Tympanic membrane, ear canal and external ear normal.     Left Ear: Tympanic membrane, ear canal and external ear normal.     Nose: Nose normal.     Mouth/Throat:     Mouth: Mucous membranes are moist.     Pharynx: Oropharynx is clear. No oropharyngeal exudate.  Eyes:     General: No scleral icterus.    Conjunctiva/sclera: Conjunctivae normal.     Pupils: Pupils are equal, round, and reactive to light.  Neck:     Thyroid: No thyromegaly.  Cardiovascular:     Rate and Rhythm: Normal rate and regular rhythm.     Pulses: Normal pulses.     Heart sounds: Normal heart sounds. No murmur heard. Pulmonary:     Effort: Pulmonary effort is normal. No respiratory distress.     Breath sounds: Normal breath sounds. No wheezing or rales.  Chest:     Comments: Breasts: breasts appear normal, no suspicious masses, no skin or nipple changes or axillary nodes  Abdominal:     General: There is no distension.     Palpations: Abdomen is soft.     Tenderness: There is no abdominal tenderness.  Musculoskeletal:        General: No deformity.     Cervical back: Neck supple.     Right lower leg: No edema.     Left lower leg: No edema.  Lymphadenopathy:     Cervical: No cervical adenopathy.  Skin:    General: Skin is warm and dry.     Findings: No rash.  Neurological:     Mental Status: She is alert and oriented to person, place, and time. Mental status is at baseline.     Sensory: No sensory deficit.     Motor: No weakness.     Gait: Gait normal.  Psychiatric:        Mood and Affect: Mood normal.        Behavior: Behavior normal.        Thought Content: Thought content normal.      Last depression screening scores PHQ 2/9 Scores 12/03/2020 07/17/2019 05/17/2017  PHQ - 2 Score 0 2 2  PHQ-  9 Score '1 4 6   '$ Last fall risk screening Fall Risk  12/03/2020  Falls in the past year? 1  Number falls in past yr: 0  Injury with Fall? 0  Risk for fall due to : No Fall Risks  Follow up Falls evaluation completed   Last Audit-C alcohol use screening Alcohol Use Disorder Test (AUDIT) 12/03/2020  1. How often do you have a drink containing alcohol? 0  2. How many drinks containing alcohol do you have on a typical day when you are drinking? 0  3. How often do you have six or more drinks on one occasion? 0  AUDIT-C Score 0  Alcohol Brief Interventions/Follow-up -   A score of 3 or more in women, and 4 or more in men indicates increased risk for alcohol abuse, EXCEPT if all of the points are from question 1   No results found for  any visits on 12/03/20.  Assessment & Plan     Problem List Items Addressed This Visit   None Visit Diagnoses     Annual physical exam    -  Primary   Relevant Orders   Lipid Panel With LDL/HDL Ratio   MM 3D SCREEN BREAST BILATERAL   Hepatitis C antibody   Hemoglobin A1c   Encounter for screening mammogram for malignant neoplasm of breast       Relevant Orders   MM 3D SCREEN BREAST BILATERAL   Encounter for hepatitis C screening test for low risk patient       Relevant Orders   Hepatitis C antibody   Colon cancer screening       Relevant Orders   Ambulatory referral to Gastroenterology   Hyperglycemia       Relevant Orders   Hemoglobin A1c      Routine Health Maintenance and Physical Exam  Exercise Activities and Dietary recommendations  Goals   None     Immunization History  Administered Date(s) Administered   Influenza-Unspecified 01/15/2017, 02/05/2019, 01/17/2020   PFIZER(Purple Top)SARS-COV-2 Vaccination 03/31/2019, 05/01/2019   Tdap 05/17/2017    Health Maintenance  Topic Date Due   Pneumococcal Vaccine 41-33 Years old (1 - PCV) Never done   Hepatitis C Screening  Never done   MAMMOGRAM  03/16/2018   COVID-19 Vaccine (3 -  Booster for Pfizer series) 09/29/2019   COLONOSCOPY (Pts 45-71yr Insurance coverage will need to be confirmed)  Never done   INFLUENZA VACCINE  11/16/2020   PAP SMEAR-Modifier  07/16/2024   TETANUS/TDAP  05/18/2027   HIV Screening  Completed   HPV VACCINES  Aged Out    Discussed health benefits of physical activity, and encouraged her to engage in regular exercise appropriate for her age and condition.  Return in about 1 year (around 12/03/2021) for CPE.     I,Essence Turner,acting as a sEducation administratorfor ALavon Paganini MD.,have documented all relevant documentation on the behalf of ALavon Paganini MD,as directed by  ALavon Paganini MD while in the presence of ALavon Paganini MD.  I, ALavon Paganini MD, have reviewed all documentation for this visit. The documentation on 12/03/20 for the exam, diagnosis, procedures, and orders are all accurate and complete.   Lakshya Mcgillicuddy, ADionne Bucy MD, MPH [facility]

## 2020-12-04 LAB — HEMOGLOBIN A1C
Est. average glucose Bld gHb Est-mCnc: 120 mg/dL
Hgb A1c MFr Bld: 5.8 % — ABNORMAL HIGH (ref 4.8–5.6)

## 2020-12-04 LAB — HEPATITIS C ANTIBODY: Hep C Virus Ab: <0.1 s/co ratio (ref 0.0–0.9)

## 2020-12-04 LAB — LIPID PANEL WITH LDL/HDL RATIO
Cholesterol, Total: 201 mg/dL — ABNORMAL HIGH (ref 100–199)
HDL: 76 mg/dL (ref >39)
LDL Chol Calc (NIH): 110 mg/dL — ABNORMAL HIGH (ref 0–99)
LDL/HDL Ratio: 1.4 ratio (ref 0.0–3.2)
Triglycerides: 85 mg/dL (ref 0–149)
VLDL Cholesterol Cal: 15 mg/dL (ref 5–40)

## 2020-12-16 ENCOUNTER — Telehealth: Payer: Self-pay

## 2020-12-16 NOTE — Telephone Encounter (Signed)
Patient is seeing Dr. Vicente Males in the office on 12/17/2020 for gastritis. She will schedule colonoscopy tomorrow at appointment

## 2020-12-17 ENCOUNTER — Other Ambulatory Visit: Payer: Self-pay

## 2020-12-17 ENCOUNTER — Ambulatory Visit: Payer: 59 | Admitting: Gastroenterology

## 2020-12-17 ENCOUNTER — Encounter: Payer: Self-pay | Admitting: Gastroenterology

## 2020-12-17 VITALS — BP 118/81 | HR 79 | Temp 98.3°F | Ht 65.0 in | Wt 160.0 lb

## 2020-12-17 DIAGNOSIS — G8929 Other chronic pain: Secondary | ICD-10-CM

## 2020-12-17 DIAGNOSIS — Z1211 Encounter for screening for malignant neoplasm of colon: Secondary | ICD-10-CM

## 2020-12-17 DIAGNOSIS — R1011 Right upper quadrant pain: Secondary | ICD-10-CM | POA: Diagnosis not present

## 2020-12-17 DIAGNOSIS — K5900 Constipation, unspecified: Secondary | ICD-10-CM | POA: Diagnosis not present

## 2020-12-17 MED ORDER — NA SULFATE-K SULFATE-MG SULF 17.5-3.13-1.6 GM/177ML PO SOLN
354.0000 mL | Freq: Once | ORAL | 0 refills | Status: AC
  Filled 2020-12-17 – 2021-01-01 (×2): qty 354, 1d supply, fill #0

## 2020-12-17 NOTE — Progress Notes (Signed)
Jonathon Bellows MD, MRCP(U.K) [address]  [place]  [place], [place] [address]  Main: [phone]  Fax: [phone]   Gastroenterology Consultation  Referring Provider:     Virginia Crews, MD Primary Care Physician:  Virginia Crews, MD Primary Gastroenterologist:  Dr. Jonathon Bellows  Reason for Consultation:     Abdominal pain and colon cancer screening        HPI:   Carol Mooney is a 45 y.o. y/o female referred for consultation & management  by Dr. Brita Romp, Dionne Bucy, MD.     She presented to the emergency room on 10/16/2020 with abdominal pain.  History of prior hysterectomy, salpingo-oophorectomy, C-section x2.  Right upper quadrant ultrasound showed normal examination with common bile duct 4 to 6 mm in size.  Gallbladder appears contracted.  Given GI cocktail and discharge. 10/16/2020: LFTs normal.  Creatinine 0.64.  Lipase normal, hemoglobin 12.7 g She has had right upper quadrant pain ongoing for many months.  2-3 times per week.  Each episode occurs about 15 minutes after meals last 2 to 3 hours.  Feels like a squeeze.  Radiates all over her abdomen.  Relieved after bowel movement.  She suffers from constipation.  Denies any blood in the stool.  Father had colon polyps.  She has never had a colonoscopy.  Ex smoker.  She is a Marine scientist at [employer].  Her sister had to have her gallbladder taken out  Past Medical History:  Diagnosis Date   Allergy    Anxiety    Complication of anesthesia    nausea and vomiting   Depression    Hypertension    Meningioma ([place]) 08/14/2017   PONV (postoperative nausea and vomiting)     Past Surgical History:  Procedure Laterality Date   c sections     HYSTERECTOMY ABDOMINAL WITH SALPINGECTOMY Bilateral 12/07/2015   Procedure: HYSTERECTOMY ABDOMINAL WITH SALPINGECTOMY/EXCISION OF ENDOMETRIOS;  Surgeon: Boykin Nearing, MD;  Location: [facility];  Service: Gynecology;  Laterality: Bilateral;   OOPHORECTOMY  Bilateral 12/07/2015   Procedure: OOPHORECTOMY;  Surgeon: Boykin Nearing, MD;  Location: [facility];  Service: Gynecology;  Laterality: Bilateral;   TUBAL LIGATION  2000   UTERINE STENT PLACEMENT Bilateral 12/07/2015   Procedure: UTERINE STENT PLACEMENT/REMOVAL;  Surgeon: Boykin Nearing, MD;  Location: [facility];  Service: Gynecology;  Laterality: Bilateral;    Prior to Admission medications   Medication Sig Start Date End Date Taking? Authorizing Provider  cefdinir (OMNICEF) 300 MG capsule Take 1 capsule (300 mg total) by mouth 2 (two) times daily for 10 days 08/05/20  Yes   DULoxetine (CYMBALTA) 60 MG capsule TAKE 1 CAPSULE BY MOUTH DAILY AT 8:00AM Patient taking differently: Take 60 mg by mouth daily. 06/19/20 06/19/21 Yes Chauncey Mann, MD  ipratropium (ATROVENT) 0.06 % nasal spray Place 2 sprays into both nostrils 4 (four) times daily. 10/27/20  Yes Margarette Canada, NP  pantoprazole (PROTONIX) 40 MG tablet Take 1 tablet (40 mg total) by mouth daily. 10/16/20 10/16/21 Yes Ward, Delice Bison, DO  senna (SENOKOT) 8.6 MG TABS tablet Take 1 tablet by mouth daily as needed for mild constipation.   Yes [provider]  sertraline (ZOLOFT) 50 MG tablet Take 50 mg by mouth daily. 04/17/19  Yes [provider]  topiramate (TOPAMAX) 50 MG tablet TAKE 1 TABLET BY MOUTH TWICE DAILY 12/30/19 12/29/20 Yes Vladimir Crofts, MD  acetaminophen (TYLENOL) 325 MG tablet Take 650 mg by  mouth every 6 (six) hours as needed. Patient not taking: Reported on 12/17/2020    [provider]  baclofen (LIORESAL) 10 MG tablet TAKE 1 TABLET BY MOUTH TWICE DAILY AS NEEDED Patient not taking: Reported on 12/17/2020 12/30/19 12/29/20  Vladimir Crofts, MD  traZODone (DESYREL) 50 MG tablet Take 1 tablet by mouth at bedtime as needed Patient not taking: Reported on 12/17/2020 07/20/20       Family History  Problem Relation Age of Onset   Coronary artery disease Father    Depression Father    Diabetes Father         insulin dependent   Depression Brother    Cancer Maternal Grandmother        breast   Breast cancer Maternal Grandmother 48   Depression Sister    Breast cancer Paternal Grandmother 82     Social History   Tobacco Use   Smoking status: Former    Packs/day: 1.00    Types: Cigarettes    Quit date: 05/20/2019    Years since quitting: 1.5   Smokeless tobacco: Never  Vaping Use   Vaping Use: Every day  Substance Use Topics   Alcohol use: Not Currently    Alcohol/week: 2.0 standard drinks    Types: 2 Shots of liquor per week    Comment: very rarely   Drug use: No    Allergies as of 12/17/2020 - Review Complete 12/17/2020  Allergen Reaction Noted   Morphine Itching 01/02/2016   Morphine and related Itching 01/02/2016    Review of Systems:    All systems reviewed and negative except where noted in HPI.   Physical Exam:  BP 118/81   Pulse 79   Temp 98.3 F (36.8 C) (Oral)   Ht '5\' 5"'$  (1.651 m)   Wt 160 lb (72.6 kg)   LMP 10/11/2015   BMI 26.63 kg/m  Patient's last menstrual period was 10/11/2015. Psych:  Alert and cooperative. Normal mood and affect. General:   Alert,  Well-developed, well-nourished, pleasant and cooperative in NAD Head:  Normocephalic and atraumatic. Eyes:  Sclera clear, no icterus.   Conjunctiva pink. Ears:  Normal auditory acuity. Lungs:  Respirations even and unlabored.  Clear throughout to auscultation.   No wheezes, crackles, or rhonchi. No acute distress. Heart:  Regular rate and rhythm; no murmurs, clicks, rubs, or gallops. Abdomen:  Normal bowel sounds.  No bruits.  Soft, non-tender and non-distended without masses, hepatosplenomegaly or hernias noted.  No guarding or rebound tenderness.    Neurologic:  Alert and oriented x3;  grossly normal neurologically.  Psych:  Alert and cooperative. Normal mood and affect.  Imaging Studies: No results found.  Assessment and Plan:   Carol Mooney is a 45 y.o. y/o female has been referred for  abdominal pain and colon cancer screening.  Father had colon polyps.  Right upper quadrant pain differential diagnosis includes irritable bowel syndrome with constipation versus gallbladder dyskinesia  Plan 1.  Colonoscopy 2.  High-fiber diet patient information provided 3.  Linzess 145 mcg 2-week samples will be provided advised to increase it to 290 mcg after a week if no response 4.  If above evaluation and treatment does not help with the right upper quadrant pain then will evaluate gallbladder with a HIDA scan.  Her sister had gallbladder issues and had to undergo a cholecystectomy.   I have discussed alternative options, risks & benefits,  which include, but are not limited to, bleeding, infection, perforation,respiratory complication &  drug reaction.  The patient agrees with this plan & written consent will be obtained.     Follow up in 6 to 8 weeks  Dr Jonathon Bellows MD,MRCP(U.K)

## 2020-12-17 NOTE — Patient Instructions (Signed)
Take Linzess 145 mcg once a day 30 minutes before breakfast. Let us know if it works so Dr. Vicente Males could write you a prescription.  High-Fiber Eating Plan Fiber, also called dietary fiber, is a type of carbohydrate. It is found foods such as fruits, vegetables, whole grains, and beans. A high-fiber diet can have many health benefits. Your health care provider may recommend a high-fiber diet to help: Prevent constipation. Fiber can make your bowel movements more regular. Lower your cholesterol. Relieve the following conditions: Inflammation of veins in the anus (hemorrhoids). Inflammation of specific areas of the digestive tract (uncomplicated diverticulosis). A problem of the large intestine, also called the colon, that sometimes causes pain and diarrhea (irritable bowel syndrome, or IBS). Prevent overeating as part of a weight-loss plan. Prevent heart disease, type 2 diabetes, and certain cancers. What are tips for following this plan? Reading food labels  Check the nutrition facts label on food products for the amount of dietary fiber. Choose foods that have 5 grams of fiber or more per serving. The goals for recommended daily fiber intake include: Men (age 46 or younger): 34-38 g. Men (over age 46): 28-34 g. Women (age 46 or younger): 25-28 g. Women (over age 46): 22-25 g. Your daily fiber goal is _____________ g. Shopping Choose whole fruits and vegetables instead of processed forms, such as apple juice or applesauce. Choose a wide variety of high-fiber foods such as avocados, lentils, oats, and kidney beans. Read the nutrition facts label of the foods you choose. Be aware of foods with added fiber. These foods often have high sugar and sodium amounts per serving. Cooking Use whole-grain flour for baking and cooking. Cook with brown rice instead of white rice. Meal planning Start the day with a breakfast that is high in fiber, such as a cereal that contains 5 g of fiber or more per  serving. Eat breads and cereals that are made with whole-grain flour instead of refined flour or white flour. Eat brown rice, bulgur wheat, or millet instead of white rice. Use beans in place of meat in soups, salads, and pasta dishes. Be sure that half of the grains you eat each day are whole grains. General information You can get the recommended daily intake of dietary fiber by: Eating a variety of fruits, vegetables, grains, nuts, and beans. Taking a fiber supplement if you are not able to take in enough fiber in your diet. It is better to get fiber through food than from a supplement. Gradually increase how much fiber you consume. If you increase your intake of dietary fiber too quickly, you may have bloating, cramping, or gas. Drink plenty of water to help you digest fiber. Choose high-fiber snacks, such as berries, raw vegetables, nuts, and popcorn. What foods should I eat? Fruits Berries. Pears. Apples. Oranges. Avocado. Prunes and raisins. Dried figs. Vegetables Sweet potatoes. Spinach. Kale. Artichokes. Cabbage. Broccoli. Cauliflower. Green peas. Carrots. Squash. Grains Whole-grain breads. Multigrain cereal. Oats and oatmeal. Brown rice. Barley. Bulgur wheat. [place]. Quinoa. Bran muffins. Popcorn. Rye wafer crackers. Meats and other proteins Navy beans, kidney beans, and pinto beans. Soybeans. Split peas. Lentils. Nuts and seeds. Dairy Fiber-fortified yogurt. Beverages Fiber-fortified soy milk. Fiber-fortified orange juice. Other foods Fiber bars. The items listed above may not be a complete list of recommended foods and beverages. Contact a dietitian for more information. What foods should I avoid? Fruits Fruit juice. Cooked, strained fruit. Vegetables Fried potatoes. Canned vegetables. Well-cooked vegetables. Grains White bread. Pasta made with  refined flour. White rice. Meats and other proteins Fatty cuts of meat. Fried chicken or fried fish. Dairy Milk. Yogurt.  Cream cheese. Sour cream. Fats and oils Butters. Beverages Soft drinks. Other foods Cakes and pastries. The items listed above may not be a complete list of foods and beverages to avoid. Talk with your dietitian about what choices are best for you. Summary Fiber is a type of carbohydrate. It is found in foods such as fruits, vegetables, whole grains, and beans. A high-fiber diet has many benefits. It can help to prevent constipation, lower blood cholesterol, aid weight loss, and reduce your risk of heart disease, diabetes, and certain cancers. Increase your intake of fiber gradually. Increasing fiber too quickly may cause cramping, bloating, and gas. Drink plenty of water while you increase the amount of fiber you consume. The best sources of fiber include whole fruits and vegetables, whole grains, nuts, seeds, and beans. This information is not intended to replace advice given to you by your health care provider. Make sure you discuss any questions you have with your health care provider. Document Revised: 08/08/2019 Document Reviewed: 08/08/2019 Elsevier Patient Education  2022 [employer].

## 2020-12-18 ENCOUNTER — Ambulatory Visit
Admission: RE | Admit: 2020-12-18 | Discharge: 2020-12-18 | Disposition: A | Discharge status: Home / Self Care | Payer: 59 | Source: Ambulatory Visit | Attending: Family Medicine | Admitting: Family Medicine

## 2020-12-18 DIAGNOSIS — Z1231 Encounter for screening mammogram for malignant neoplasm of breast: Secondary | ICD-10-CM | POA: Diagnosis not present

## 2020-12-18 DIAGNOSIS — Z0000 Encounter for general adult medical examination without abnormal findings: Secondary | ICD-10-CM | POA: Insufficient documentation

## 2020-12-28 ENCOUNTER — Other Ambulatory Visit: Payer: Self-pay

## 2021-01-01 ENCOUNTER — Other Ambulatory Visit: Payer: Self-pay

## 2021-01-04 ENCOUNTER — Other Ambulatory Visit: Payer: Self-pay

## 2021-01-06 ENCOUNTER — Encounter: Payer: Self-pay | Admitting: Gastroenterology

## 2021-01-07 ENCOUNTER — Ambulatory Visit: Payer: 59 | Admitting: Anesthesiology

## 2021-01-07 ENCOUNTER — Encounter
Admission: RE | Disposition: A | Discharge status: Home / Self Care | Payer: Self-pay | Source: Ambulatory Visit | Attending: Gastroenterology

## 2021-01-07 ENCOUNTER — Ambulatory Visit
Admission: RE | Admit: 2021-01-07 | Discharge: 2021-01-07 | Disposition: A | Discharge status: Home / Self Care | Payer: 59 | Source: Ambulatory Visit | Attending: Gastroenterology | Admitting: Gastroenterology

## 2021-01-07 ENCOUNTER — Encounter: Payer: Self-pay | Admitting: Gastroenterology

## 2021-01-07 DIAGNOSIS — D122 Benign neoplasm of ascending colon: Secondary | ICD-10-CM | POA: Diagnosis not present

## 2021-01-07 DIAGNOSIS — Z1211 Encounter for screening for malignant neoplasm of colon: Secondary | ICD-10-CM | POA: Insufficient documentation

## 2021-01-07 DIAGNOSIS — Z885 Allergy status to narcotic agent status: Secondary | ICD-10-CM | POA: Insufficient documentation

## 2021-01-07 DIAGNOSIS — Z87891 Personal history of nicotine dependence: Secondary | ICD-10-CM | POA: Insufficient documentation

## 2021-01-07 DIAGNOSIS — Z79899 Other long term (current) drug therapy: Secondary | ICD-10-CM | POA: Insufficient documentation

## 2021-01-07 DIAGNOSIS — K635 Polyp of colon: Secondary | ICD-10-CM | POA: Diagnosis not present

## 2021-01-07 DIAGNOSIS — K648 Other hemorrhoids: Secondary | ICD-10-CM | POA: Insufficient documentation

## 2021-01-07 DIAGNOSIS — K620 Anal polyp: Secondary | ICD-10-CM | POA: Diagnosis not present

## 2021-01-07 DIAGNOSIS — K621 Rectal polyp: Secondary | ICD-10-CM | POA: Diagnosis not present

## 2021-01-07 DIAGNOSIS — D128 Benign neoplasm of rectum: Secondary | ICD-10-CM | POA: Diagnosis not present

## 2021-01-07 HISTORY — PX: COLONOSCOPY WITH PROPOFOL: SHX5780

## 2021-01-07 SURGERY — COLONOSCOPY WITH PROPOFOL
Anesthesia: General

## 2021-01-07 MED ORDER — PROPOFOL 500 MG/50ML IV EMUL
INTRAVENOUS | Status: DC | PRN
  Administered 2021-01-07: 200 ug/kg/min via INTRAVENOUS

## 2021-01-07 MED ORDER — PROPOFOL 10 MG/ML IV BOLUS
INTRAVENOUS | Status: DC | PRN
  Administered 2021-01-07: 100 mg via INTRAVENOUS

## 2021-01-07 MED ORDER — SODIUM CHLORIDE 0.9 % IV SOLN: INTRAVENOUS | Status: DC

## 2021-01-07 NOTE — Op Note (Signed)
[facility] Gastroenterology Patient Name: Carol Mooney Procedure Date: 01/07/2021 12:07 PM MRN: [id number] Account #: [id number] Date of Birth: April 24, 1975 Admit Type: Outpatient Age: 45 Room: [facility] ENDO ROOM 3 Gender: Female Note Status: Finalized Instrument Name: Park Meo [id number] Procedure:             Colonoscopy Indications:           Screening for colorectal malignant neoplasm Providers:             Jonathon Bellows MD, MD Referring MD:          Dionne Bucy. Bacigalupo (Referring MD) Medicines:             Monitored Anesthesia Care Complications:         No immediate complications. Procedure:             Pre-Anesthesia Assessment:                        - Prior to the procedure, a History and Physical was                         performed, and patient medications, allergies and                         sensitivities were reviewed. The patient's tolerance                         of previous anesthesia was reviewed.                        - The risks and benefits of the procedure and the                         sedation options and risks were discussed with the                         patient. All questions were answered and informed                         consent was obtained.                        - ASA Grade Assessment: II - A patient with mild                         systemic disease.                        After obtaining informed consent, the colonoscope was                         passed under direct vision. Throughout the procedure,                         the patient's blood pressure, pulse, and oxygen                         saturations were monitored continuously. The                         Colonoscope was introduced  through the anus and                         advanced to the the cecum, identified by the                         appendiceal orifice. The colonoscopy was performed                         with ease. The patient tolerated the procedure well.                          The quality of the bowel preparation was excellent. Findings:      The perianal and digital rectal examinations were normal.      Seven sessile polyps were found in the rectum. The polyps were 5 to 7 mm       in size. These polyps were removed with a cold snare. Resection and       retrieval were complete.      A 3 mm polyp was found in the ascending colon. The polyp was sessile.       The polyp was removed with a cold biopsy forceps. Resection and       retrieval were complete.      A 10 mm polyp was found in the anus. The polyp was semi-pedunculated.       Polypectomy was not attempted due to polyp on internal hemorroid      The exam was otherwise without abnormality on direct and retroflexion       views. Impression:            - Seven 5 to 7 mm polyps in the rectum, removed with a                         cold snare. Resected and retrieved.                        - One 3 mm polyp in the ascending colon, removed with                         a cold biopsy forceps. Resected and retrieved.                        - One 10 mm polyp at the anus. Resection not attempted.                        - The examination was otherwise normal on direct and                         retroflexion views. Recommendation:        - Discharge patient to home (with escort).                        - Resume previous diet.                        - Continue present medications.                        - Await pathology results.                        -  Repeat colonoscopy for surveillance based on                         pathology results.                        - Refer to a surgeon Dr Dahlia Byes to excise                         polyp/papilomma over internal hemorroid. Procedure Code(s):     --- Professional ---                        [phone], Colonoscopy, flexible; with removal of                         tumor(s), polyp(s), or other lesion(s) by snare                         technique                         45380, 42, Colonoscopy, flexible; with biopsy, single                         or multiple Diagnosis Code(s):     --- Professional ---                        Z12.11, Encounter for screening for malignant neoplasm                         of colon                        K62.1, Rectal polyp                        K63.5, Polyp of colon                        K62.0, Anal polyp CPT copyright 2019 American Medical Association. All rights reserved. The codes documented in this report are preliminary and upon coder review may  be revised to meet current compliance requirements. Jonathon Bellows, MD Jonathon Bellows MD, MD 01/07/2021 12:39:49 PM This report has been signed electronically. Number of Addenda: 0 Note Initiated On: 01/07/2021 12:07 PM Scope Withdrawal Time: 0 hours 14 minutes 18 seconds  Total Procedure Duration: 0 hours 20 minutes 11 seconds  Estimated Blood Loss:  Estimated blood loss: none.      [facility]

## 2021-01-07 NOTE — H&P (Signed)
Jonathon Bellows, MD [address] [place], [place], [place], [address] [address], [place], [place], [place], [address] Phone: [phone]  Fax: [phone]  Primary Care Physician:  Virginia Crews, MD   Pre-Procedure History & Physical: HPI:  Carol Mooney is a 45 y.o. female is here for an colonoscopy.   Past Medical History:  Diagnosis Date   Allergy    Anxiety    Complication of anesthesia    nausea and vomiting   Depression    Hypertension    Meningioma ([place]) 08/14/2017   PONV (postoperative nausea and vomiting)     Past Surgical History:  Procedure Laterality Date   ABDOMINAL HYSTERECTOMY     c sections     HYSTERECTOMY ABDOMINAL WITH SALPINGECTOMY Bilateral 12/07/2015   Procedure: HYSTERECTOMY ABDOMINAL WITH SALPINGECTOMY/EXCISION OF ENDOMETRIOS;  Surgeon: Boykin Nearing, MD;  Location: [facility] ORS;  Service: Gynecology;  Laterality: Bilateral;   OOPHORECTOMY Bilateral 12/07/2015   Procedure: OOPHORECTOMY;  Surgeon: Boykin Nearing, MD;  Location: [facility] ORS;  Service: Gynecology;  Laterality: Bilateral;   TUBAL LIGATION  04/18/1998   UTERINE STENT PLACEMENT Bilateral 12/07/2015   Procedure: UTERINE STENT PLACEMENT/REMOVAL;  Surgeon: Boykin Nearing, MD;  Location: [facility] ORS;  Service: Gynecology;  Laterality: Bilateral;    Prior to Admission medications   Medication Sig Start Date End Date Taking? Authorizing Provider  acetaminophen (TYLENOL) 325 MG tablet Take 650 mg by mouth every 6 (six) hours as needed.   Yes [provider]  DULoxetine (CYMBALTA) 60 MG capsule TAKE 1 CAPSULE BY MOUTH DAILY AT 8:00AM Patient taking differently: Take 60 mg by mouth daily. 06/19/20 06/19/21 Yes Chauncey Mann, MD  ipratropium (ATROVENT) 0.06 % nasal spray Place 2 sprays into both nostrils 4 (four) times daily. 10/27/20  Yes Margarette Canada, NP  linaCLOtide (LINZESS PO) Take by mouth.   Yes [provider]  pantoprazole (PROTONIX) 40 MG  tablet Take 1 tablet (40 mg total) by mouth daily. 10/16/20 10/16/21 Yes Ward, Delice Bison, DO  senna (SENOKOT) 8.6 MG TABS tablet Take 1 tablet by mouth daily as needed for mild constipation.   Yes [provider]  sertraline (ZOLOFT) 50 MG tablet Take 50 mg by mouth daily. 04/17/19  Yes [provider]  traZODone (DESYREL) 50 MG tablet Take 1 tablet by mouth at bedtime as needed 07/20/20  Yes   cefdinir (OMNICEF) 300 MG capsule Take 1 capsule (300 mg total) by mouth 2 (two) times daily for 10 days Patient not taking: Reported on 01/07/2021 08/05/20     topiramate (TOPAMAX) 50 MG tablet TAKE 1 TABLET BY MOUTH TWICE DAILY 12/30/19 12/29/20  Vladimir Crofts, MD    Allergies as of 12/17/2020 - Review Complete 12/17/2020  Allergen Reaction Noted   Morphine Itching 01/02/2016   Morphine and related Itching 01/02/2016    Family History  Problem Relation Age of Onset   Coronary artery disease Father    Depression Father    Diabetes Father        insulin dependent   Depression Brother    Cancer Maternal Grandmother        breast   Breast cancer Maternal Grandmother 52   Depression Sister    Breast cancer Paternal Grandmother 82    Social History   Socioeconomic History   Marital status: Married    Spouse name: Not on file   Number of children: Not on file   Years of education: Not on  file   Highest education level: Not on file  Occupational History   Not on file  Tobacco Use   Smoking status: Former    Packs/day: 1.00    Types: Cigarettes    Quit date: 05/20/2019    Years since quitting: 1.6   Smokeless tobacco: Never  Vaping Use   Vaping Use: Every day  Substance and Sexual Activity   Alcohol use: Not Currently   Drug use: No   Sexual activity: Yes    Birth control/protection: Other-see comments    Comment: hysterectomy  Other Topics Concern   Not on file  Social History Narrative   Not on file   Social Determinants of Health   Financial Resource Strain: Not  on file  Food Insecurity: Not on file  Transportation Needs: Not on file  Physical Activity: Not on file  Stress: Not on file  Social Connections: Not on file  Intimate Partner Violence: Not on file    Review of Systems: See HPI, otherwise negative ROS  Physical Exam: BP 123/80   Pulse 63   Temp (!) 96.7 F (35.9 C) (Temporal)   Resp 18   Ht 5\' 4"  (1.626 m)   Wt 70.3 kg   LMP 10/11/2015   SpO2 99%   BMI 26.61 kg/m  General:   Alert,  pleasant and cooperative in NAD Head:  Normocephalic and atraumatic. Neck:  Supple; no masses or thyromegaly. Lungs:  Clear throughout to auscultation, normal respiratory effort.    Heart:  +S1, +S2, Regular rate and rhythm, No edema. Abdomen:  Soft, nontender and nondistended. Normal bowel sounds, without guarding, and without rebound.   Neurologic:  Alert and  oriented x4;  grossly normal neurologically.  Impression/Plan: Carol Mooney is here for an colonoscopy to be performed for Screening colonoscopy average risk   Risks, benefits, limitations, and alternatives regarding  colonoscopy have been reviewed with the patient.  Questions have been answered.  All parties agreeable.   Jonathon Bellows, MD  01/07/2021, 12:06 PM

## 2021-01-07 NOTE — Anesthesia Preprocedure Evaluation (Addendum)
Anesthesia Evaluation  Patient identified by MRN, date of birth, ID band Patient awake    Reviewed: Allergy & Precautions, NPO status , Patient's Chart, lab work & pertinent test results  History of Anesthesia Complications (+) PONV  Airway Mallampati: I  TM Distance: >3 FB Neck ROM: Full    Dental   Pulmonary neg pulmonary ROS, former smoker,    Pulmonary exam normal        Cardiovascular hypertension, Normal cardiovascular exam     Neuro/Psych  Headaches, Anxiety Depression Meningioma, stable     GI/Hepatic negative GI ROS, History of alcohol use disorder   Endo/Other  negative endocrine ROS  Renal/GU negative Renal ROS  negative genitourinary   Musculoskeletal negative musculoskeletal ROS (+)   Abdominal   Peds  Hematology negative hematology ROS (+)   Anesthesia Other Findings   Reproductive/Obstetrics                            Anesthesia Physical Anesthesia Plan  ASA: 2  Anesthesia Plan: General   Post-op Pain Management:    Induction:   PONV Risk Score and Plan: 4 or greater and Ondansetron  Airway Management Planned: Natural Airway  Additional Equipment:   Intra-op Plan:   Post-operative Plan:   Informed Consent: I have reviewed the patients History and Physical, chart, labs and discussed the procedure including the risks, benefits and alternatives for the proposed anesthesia with the patient or authorized representative who has indicated his/her understanding and acceptance.       Plan Discussed with: CRNA  Anesthesia Plan Comments:         Anesthesia Quick Evaluation

## 2021-01-07 NOTE — Transfer of Care (Signed)
Immediate Anesthesia Transfer of Care Note  Patient: Carol Mooney  Procedure(s) Performed: COLONOSCOPY WITH PROPOFOL  Patient Location: PACU  Anesthesia Type:General  Level of Consciousness: awake, alert  and oriented  Airway & Oxygen Therapy: Patient Spontanous Breathing and Patient connected to nasal cannula oxygen  Post-op Assessment: Report given to RN and Post -op Vital signs reviewed and stable  Post vital signs: Reviewed and stable  Last Vitals:  Vitals Value Taken Time  BP 94/69 01/07/21 1242  Temp 35.9 C 01/07/21 1241  Pulse 68 01/07/21 1243  Resp 17 01/07/21 1243  SpO2 100 % 01/07/21 1243  Vitals shown include unvalidated device data.  Last Pain:  Vitals:   01/07/21 1241  TempSrc: Temporal  PainSc: 0-No pain         Complications: No notable events documented.

## 2021-01-08 ENCOUNTER — Encounter: Payer: Self-pay | Admitting: Gastroenterology

## 2021-01-08 LAB — SURGICAL PATHOLOGY

## 2021-01-08 NOTE — Anesthesia Postprocedure Evaluation (Signed)
Anesthesia Post Note  Patient: Carol Mooney  Procedure(s) Performed: COLONOSCOPY WITH PROPOFOL  Patient location during evaluation: PACU Anesthesia Type: General Level of consciousness: awake and alert Pain management: pain level controlled Vital Signs Assessment: post-procedure vital signs reviewed and stable Respiratory status: spontaneous breathing, nonlabored ventilation, respiratory function stable and patient connected to nasal cannula oxygen Cardiovascular status: blood pressure returned to baseline and stable Postop Assessment: no apparent nausea or vomiting Anesthetic complications: no   No notable events documented.   Last Vitals:  Vitals:   01/07/21 1153 01/07/21 1241  BP: 123/80 94/69  Pulse: 63   Resp: 18   Temp:  (!) 35.9 C  SpO2: 99%     Last Pain:  Vitals:   01/07/21 1251  TempSrc:   PainSc: 0-No pain                 [place]

## 2021-01-11 ENCOUNTER — Telehealth: Payer: Self-pay | Admitting: Gastroenterology

## 2021-01-11 NOTE — Telephone Encounter (Signed)
Pt. Requesting call back about a medicine that Dr. Vicente Males just started her on

## 2021-01-12 NOTE — Telephone Encounter (Signed)
Called and left a message for call back  

## 2021-01-13 ENCOUNTER — Other Ambulatory Visit: Payer: Self-pay

## 2021-01-15 NOTE — Telephone Encounter (Signed)
Called patient and left her a voicemail to call us back in case she still had a question.

## 2021-01-17 ENCOUNTER — Encounter: Payer: Self-pay | Admitting: Gastroenterology

## 2021-01-21 ENCOUNTER — Other Ambulatory Visit: Payer: Self-pay

## 2021-01-21 DIAGNOSIS — Z86010 Personal history of colonic polyps: Secondary | ICD-10-CM

## 2021-01-21 NOTE — Telephone Encounter (Signed)
There was a small polyp at the anus which need to be excised by Dr. Dahlia Byes.  I have not taken that out.  It is over a hemorrhoid and this needs to be taken out by a surgeon

## 2021-01-21 NOTE — Progress Notes (Signed)
Referral gener

## 2021-01-25 ENCOUNTER — Other Ambulatory Visit: Payer: Self-pay

## 2021-01-25 MED ORDER — LINACLOTIDE 290 MCG PO CAPS
290.0000 ug | ORAL_CAPSULE | Freq: Every day | ORAL | 3 refills | Status: AC
  Filled 2021-01-25: qty 30, 30d supply, fill #0
  Filled 2021-05-19: qty 30, 30d supply, fill #1
  Filled 2021-11-19: qty 30, 30d supply, fill #2

## 2021-01-25 MED ORDER — LINZESS 145 MCG PO CAPS
145.0000 ug | ORAL_CAPSULE | Freq: Two times a day (BID) | ORAL | 3 refills | Status: DC
  Filled 2021-01-25: qty 180, 90d supply, fill #0

## 2021-01-25 NOTE — Addendum Note (Signed)
Addended by: Wayna Chalet on: 01/25/2021 07:55 AM   Modules accepted: Orders

## 2021-02-11 ENCOUNTER — Ambulatory Visit: Payer: 59 | Admitting: Gastroenterology

## 2021-02-17 ENCOUNTER — Ambulatory Visit: Payer: 59 | Admitting: Surgery

## 2021-02-17 ENCOUNTER — Other Ambulatory Visit: Payer: Self-pay

## 2021-02-17 ENCOUNTER — Encounter: Payer: Self-pay | Admitting: Surgery

## 2021-02-17 VITALS — BP 118/74 | HR 76 | Temp 98.1°F | Ht 66.0 in | Wt 161.6 lb

## 2021-02-17 DIAGNOSIS — K602 Anal fissure, unspecified: Secondary | ICD-10-CM

## 2021-02-17 DIAGNOSIS — K620 Anal polyp: Secondary | ICD-10-CM | POA: Diagnosis not present

## 2021-02-17 NOTE — Patient Instructions (Addendum)
Our surgery scheduler Pamala Hurry will call you within 24-48 hours to get you scheduled. If you have not heard from her after 48 hours, please call our office. You will not need to get Covid tested before surgery and have the blue sheet available when she calls to write down important information.   Please pick up your prescription at [facility] in [place].  [address], [place], [place] [address]  [phone]   If you have any concerns or questions, please feel free to call our office.   Colon Polyps Colon polyps are tissue growths inside the colon, which is part of the large intestine. They are one of the types of polyps that can grow in the body. A polyp may be a round bump or a mushroom-shaped growth. You could have one polyp or more than one. Most colon polyps are noncancerous (benign). However, some colon polyps can become cancerous over time. Finding and removing the polyps early can help prevent this. What are the causes? The exact cause of colon polyps is not known. What increases the risk? The following factors may make you more likely to develop this condition: Having a family history of colorectal cancer or colon polyps. Being older than 46 years of age. Being younger than 46 years of age and having a significant family history of colorectal cancer or colon polyps or a genetic condition that puts you at higher risk of getting colon polyps. Having inflammatory bowel disease, such as ulcerative colitis or Crohn's disease. Having certain conditions passed from parent to child (hereditary conditions), such as: Familial adenomatous polyposis (FAP). Lynch syndrome. Turcot syndrome. Peutz-Jeghers syndrome. MUTYH-associated polyposis (MAP). Being overweight. Certain lifestyle factors. These include smoking cigarettes, drinking too much alcohol, not getting enough exercise, and eating a diet that is high in fat and red meat and low in fiber. Having had childhood cancer that was treated  with radiation of the abdomen. What are the signs or symptoms? Many times, there are no symptoms. If you have symptoms, they may include: Blood coming from the rectum during a bowel movement. Blood in the stool (feces). The blood may be bright red or very dark in color. Pain in the abdomen. A change in bowel habits, such as constipation or diarrhea. How is this diagnosed? This condition is diagnosed with a colonoscopy. This is a procedure in which a lighted, flexible scope is inserted into the opening between the buttocks (anus) and then passed into the colon to examine the area. Polyps are sometimes found when a colonoscopy is done as part of routine cancer screening tests. How is this treated? This condition is treated by removing any polyps that are found. Most polyps can be removed during a colonoscopy. Those polyps will then be tested for cancer. Additional treatment may be needed depending on the results of testing. Follow these instructions at home: Eating and drinking  Eat foods that are high in fiber, such as fruits, vegetables, and whole grains. Eat foods that are high in calcium and vitamin D, such as milk, cheese, yogurt, eggs, liver, fish, and broccoli. Limit foods that are high in fat, such as fried foods and desserts. Limit the amount of red meat, precooked or cured meat, or other processed meat that you eat, such as hot dogs, sausages, bacon, or meat loaves. Limit sugary drinks. Lifestyle Maintain a healthy weight, or lose weight if recommended by your health care provider. Exercise every day or as told by your health care provider. Do not  use any products that contain nicotine or tobacco, such as cigarettes, e-cigarettes, and chewing tobacco. If you need help quitting, ask your health care provider. Do not drink alcohol if: Your health care provider tells you not to drink. You are pregnant, may be pregnant, or are planning to become pregnant. If you drink alcohol: Limit  how much you use to: 0-1 drink a day for women. 0-2 drinks a day for men. Know how much alcohol is in your drink. In the [place] one drink equals one 12 oz bottle of beer (355 mL), one 5 oz glass of wine (148 mL), or one 1 oz glass of hard liquor (44 mL). General instructions Take over-the-counter and prescription medicines only as told by your health care provider. Keep all follow-up visits. This is important. This includes having regularly scheduled colonoscopies. Talk to your health care provider about when you need a colonoscopy. Contact a health care provider if: You have new or worsening bleeding during a bowel movement. You have new or increased blood in your stool. You have a change in bowel habits. You lose weight for no known reason. Summary Colon polyps are tissue growths inside the colon, which is part of the large intestine. They are one type of polyp that can grow in the body. Most colon polyps are noncancerous (benign), but some can become cancerous over time. This condition is diagnosed with a colonoscopy. This condition is treated by removing any polyps that are found. Most polyps can be removed during a colonoscopy. This information is not intended to replace advice given to you by your health care provider. Make sure you discuss any questions you have with your health care provider. Document Revised: 07/24/2019 Document Reviewed: 07/24/2019 Elsevier Patient Education  [employer].

## 2021-02-18 ENCOUNTER — Telehealth: Payer: Self-pay | Admitting: Surgery

## 2021-02-18 NOTE — Telephone Encounter (Signed)
Patient returns call.  She is now informed of all dates regarding her surgery and verbalized understanding.

## 2021-02-18 NOTE — Telephone Encounter (Signed)
Outgoing call is made, left message for patient to call.  Please inform patient of Pre-Admission date/time, COVID Testing date and Surgery date.  Surgery Date: 03/02/21 Preadmission Testing Date: 02/25/21 (phone 8a-1p) Covid Testing Date: Not needed.     Also patient will need to call at [phone], between 1-3:00pm the day before surgery, to find out what time to arrive for surgery.

## 2021-02-19 ENCOUNTER — Encounter: Payer: Self-pay | Admitting: Surgery

## 2021-02-19 NOTE — Progress Notes (Signed)
Patient ID: Carol Mooney, female   DOB: 1975-11-26, 46 y.o.   MRN: [id number]  HPI Carol Mooney is a 45 y.o. female seen in consultation at the request of Dr. Vicente Males.  He was found a anorectal polyp on colonoscopy that was not amenable to resection.  She endorses significant anorectal pain for the last couple months.  Pain is not related to defecation.  She also reports significant constipation.  She is a Marine scientist in our oncology floor. I have personally reviewed the colonoscopy showing evidence of multiple benign polyps and a rectal polyp. CBC and CMP was completely normal.  She did also have an ultrasound that I personally reviewed showing no evidence of gallstones normal common bile duct. Past surgical history of C-section and hysterectomy. 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  She was already started onLinzess by GI  HPI  Past Medical History:  Diagnosis Date   Allergy    Anxiety    Complication of anesthesia    nausea and vomiting   Depression    Hypertension    Meningioma ([place]) 08/14/2017   PONV (postoperative nausea and vomiting)     Past Surgical History:  Procedure Laterality Date   ABDOMINAL HYSTERECTOMY     c sections     COLONOSCOPY WITH PROPOFOL N/A 01/07/2021   Procedure: COLONOSCOPY WITH PROPOFOL;  Surgeon: Jonathon Bellows, MD;  Location: [facility] ENDOSCOPY;  Service: Gastroenterology;  Laterality: N/A;   HYSTERECTOMY ABDOMINAL WITH SALPINGECTOMY Bilateral 12/07/2015   Procedure: HYSTERECTOMY ABDOMINAL WITH SALPINGECTOMY/EXCISION OF ENDOMETRIOS;  Surgeon: Boykin Nearing, MD;  Location: [facility] ORS;  Service: Gynecology;  Laterality: Bilateral;   OOPHORECTOMY Bilateral 12/07/2015   Procedure: OOPHORECTOMY;  Surgeon: Boykin Nearing, MD;  Location: [facility] ORS;  Service: Gynecology;  Laterality: Bilateral;   TUBAL LIGATION  04/18/1998   UTERINE STENT PLACEMENT Bilateral 12/07/2015   Procedure: UTERINE STENT  PLACEMENT/REMOVAL;  Surgeon: Boykin Nearing, MD;  Location: [facility] ORS;  Service: Gynecology;  Laterality: Bilateral;    Family History  Problem Relation Age of Onset   Coronary artery disease Father    Depression Father    Diabetes Father        insulin dependent   Depression Brother    Cancer Maternal Grandmother        breast   Breast cancer Maternal Grandmother 29   Depression Sister    Breast cancer Paternal Grandmother 82    Social History Social History   Tobacco Use   Smoking status: Former    Packs/day: 1.00    Types: Cigarettes    Quit date: 05/20/2019    Years since quitting: 1.7   Smokeless tobacco: Never  Vaping Use   Vaping Use: Every day  Substance Use Topics   Alcohol use: Not Currently   Drug use: No    Allergies  Allergen Reactions   Morphine Itching   Morphine And Related Itching    Current Outpatient Medications  Medication Sig Dispense Refill   acetaminophen (TYLENOL) 325 MG tablet Take 650 mg by mouth every 6 (six) hours as needed.     DULoxetine (CYMBALTA) 60 MG capsule TAKE 1 CAPSULE BY MOUTH DAILY AT 8:00AM (Patient taking differently: Take 60 mg by mouth daily.) 90 capsule 0   ipratropium (ATROVENT) 0.06 % nasal spray Place 2 sprays into both nostrils 4 (four) times daily. 15 mL 12   linaclotide (LINZESS) 290 MCG CAPS capsule Take 1 capsule (290 mcg total) by mouth daily before  breakfast. 30 capsule 3   pantoprazole (PROTONIX) 40 MG tablet Take 1 tablet (40 mg total) by mouth daily. 30 tablet 1   senna (SENOKOT) 8.6 MG TABS tablet Take 1 tablet by mouth daily as needed for mild constipation.     sertraline (ZOLOFT) 50 MG tablet Take 50 mg by mouth daily.     traZODone (DESYREL) 50 MG tablet Take 1 tablet by mouth at bedtime as needed 90 tablet 1   topiramate (TOPAMAX) 50 MG tablet TAKE 1 TABLET BY MOUTH TWICE DAILY 60 tablet 11   No current facility-administered medications for this visit.     Review of Systems Full ROS  was asked  and was negative except for the information on the HPI  Physical Exam Blood pressure 118/74, pulse 76, temperature 98.1 F (36.7 C), temperature source Oral, height 5\' 6"  (1.676 m), weight 161 lb 9.6 oz (73.3 kg), last menstrual period 10/11/2015, SpO2 99 %. CONSTITUTIONAL: NAD. EYES: Pupils are equal, round,  Sclera are non-icteric. EARS, NOSE, MOUTH AND THROAT: She is wearing a mask, Hearing is intact to voice. LYMPH NODES:  Lymph nodes in the neck are normal. RESPIRATORY:  Lungs are clear. There is normal respiratory effort, with equal breath sounds bilaterally, and without pathologic use of accessory muscles. CARDIOVASCULAR: Heart is regular without murmurs, gallops, or rubs. GI: The abdomen is  soft, nontender, and nondistended. There are no palpable masses. There is no hepatosplenomegaly. There are normal bowel sounds in all quadrants. Rectal: No hemorrhoids, posterior midline tenderness, exam limited due to pain. MUSCULOSKELETAL: Normal muscle strength and tone. No cyanosis or edema.   SKIN: Turgor is good and there are no pathologic skin lesions or ulcers. NEUROLOGIC: Motor and sensation is grossly normal. Cranial nerves are grossly intact. PSYCH:  Oriented to person, place and time. Affect is normal.  Data Reviewed  I have personally reviewed the patient's imaging, laboratory findings and medical records.    Assessment/Plan 45 year old female with anorectal polyp.  Discussed with the patient in detail I do think that we will need to do an exam under anesthesia and perform excision of lesion in the OR.  Also discussed with her about my concerns for an anal fissure.  I do recommend sitz bath's as well has nifedipine cream. Time spent in this encounter  was 60 minutes, including, personally reviewing records, placing orders, counseling the pt and coordinating her care   Caroleen Hamman, MD FACS General Surgeon 02/19/2021, 12:02 PM

## 2021-02-19 NOTE — H&P (View-Only) (Signed)
Patient ID: Su Ley, female   DOB: Sep 16, 1975, 45 y.o.   MRN: [id number]  HPI Marnesha Gagen Capers is a 45 y.o. female seen in consultation at the request of Dr. Vicente Males.  He was found a anorectal polyp on colonoscopy that was not amenable to resection.  She endorses significant anorectal pain for the last couple months.  Pain is not related to defecation.  She also reports significant constipation.  She is a Marine scientist in our oncology floor. I have personally reviewed the colonoscopy showing evidence of multiple benign polyps and a rectal polyp. CBC and CMP was completely normal.  She did also have an ultrasound that I personally reviewed showing no evidence of gallstones normal common bile duct. Past surgical history of C-section and hysterectomy. 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  She was already started onLinzess by GI  HPI  Past Medical History:  Diagnosis Date   Allergy    Anxiety    Complication of anesthesia    nausea and vomiting   Depression    Hypertension    Meningioma ([place]) 08/14/2017   PONV (postoperative nausea and vomiting)     Past Surgical History:  Procedure Laterality Date   ABDOMINAL HYSTERECTOMY     c sections     COLONOSCOPY WITH PROPOFOL N/A 01/07/2021   Procedure: COLONOSCOPY WITH PROPOFOL;  Surgeon: Jonathon Bellows, MD;  Location: [facility] ENDOSCOPY;  Service: Gastroenterology;  Laterality: N/A;   HYSTERECTOMY ABDOMINAL WITH SALPINGECTOMY Bilateral 12/07/2015   Procedure: HYSTERECTOMY ABDOMINAL WITH SALPINGECTOMY/EXCISION OF ENDOMETRIOS;  Surgeon: Boykin Nearing, MD;  Location: [facility] ORS;  Service: Gynecology;  Laterality: Bilateral;   OOPHORECTOMY Bilateral 12/07/2015   Procedure: OOPHORECTOMY;  Surgeon: Boykin Nearing, MD;  Location: [facility] ORS;  Service: Gynecology;  Laterality: Bilateral;   TUBAL LIGATION  04/18/1998   UTERINE STENT PLACEMENT Bilateral 12/07/2015   Procedure: UTERINE STENT  PLACEMENT/REMOVAL;  Surgeon: Boykin Nearing, MD;  Location: [facility] ORS;  Service: Gynecology;  Laterality: Bilateral;    Family History  Problem Relation Age of Onset   Coronary artery disease Father    Depression Father    Diabetes Father        insulin dependent   Depression Brother    Cancer Maternal Grandmother        breast   Breast cancer Maternal Grandmother 58   Depression Sister    Breast cancer Paternal Grandmother 82    Social History Social History   Tobacco Use   Smoking status: Former    Packs/day: 1.00    Types: Cigarettes    Quit date: 05/20/2019    Years since quitting: 1.7   Smokeless tobacco: Never  Vaping Use   Vaping Use: Every day  Substance Use Topics   Alcohol use: Not Currently   Drug use: No    Allergies  Allergen Reactions   Morphine Itching   Morphine And Related Itching    Current Outpatient Medications  Medication Sig Dispense Refill   acetaminophen (TYLENOL) 325 MG tablet Take 650 mg by mouth every 6 (six) hours as needed.     DULoxetine (CYMBALTA) 60 MG capsule TAKE 1 CAPSULE BY MOUTH DAILY AT 8:00AM (Patient taking differently: Take 60 mg by mouth daily.) 90 capsule 0   ipratropium (ATROVENT) 0.06 % nasal spray Place 2 sprays into both nostrils 4 (four) times daily. 15 mL 12   linaclotide (LINZESS) 290 MCG CAPS capsule Take 1 capsule (290 mcg total) by mouth daily before  breakfast. 30 capsule 3   pantoprazole (PROTONIX) 40 MG tablet Take 1 tablet (40 mg total) by mouth daily. 30 tablet 1   senna (SENOKOT) 8.6 MG TABS tablet Take 1 tablet by mouth daily as needed for mild constipation.     sertraline (ZOLOFT) 50 MG tablet Take 50 mg by mouth daily.     traZODone (DESYREL) 50 MG tablet Take 1 tablet by mouth at bedtime as needed 90 tablet 1   topiramate (TOPAMAX) 50 MG tablet TAKE 1 TABLET BY MOUTH TWICE DAILY 60 tablet 11   No current facility-administered medications for this visit.     Review of Systems Full ROS  was asked  and was negative except for the information on the HPI  Physical Exam Blood pressure 118/74, pulse 76, temperature 98.1 F (36.7 C), temperature source Oral, height 5\' 6"  (1.676 m), weight 161 lb 9.6 oz (73.3 kg), last menstrual period 10/11/2015, SpO2 99 %. CONSTITUTIONAL: NAD. EYES: Pupils are equal, round,  Sclera are non-icteric. EARS, NOSE, MOUTH AND THROAT: She is wearing a mask, Hearing is intact to voice. LYMPH NODES:  Lymph nodes in the neck are normal. RESPIRATORY:  Lungs are clear. There is normal respiratory effort, with equal breath sounds bilaterally, and without pathologic use of accessory muscles. CARDIOVASCULAR: Heart is regular without murmurs, gallops, or rubs. GI: The abdomen is  soft, nontender, and nondistended. There are no palpable masses. There is no hepatosplenomegaly. There are normal bowel sounds in all quadrants. Rectal: No hemorrhoids, posterior midline tenderness, exam limited due to pain. MUSCULOSKELETAL: Normal muscle strength and tone. No cyanosis or edema.   SKIN: Turgor is good and there are no pathologic skin lesions or ulcers. NEUROLOGIC: Motor and sensation is grossly normal. Cranial nerves are grossly intact. PSYCH:  Oriented to person, place and time. Affect is normal.  Data Reviewed  I have personally reviewed the patient's imaging, laboratory findings and medical records.    Assessment/Plan 45 year old female with anorectal polyp.  Discussed with the patient in detail I do think that we will need to do an exam under anesthesia and perform excision of lesion in the OR.  Also discussed with her about my concerns for an anal fissure.  I do recommend sitz bath's as well has nifedipine cream. Time spent in this encounter  was 60 minutes, including, personally reviewing records, placing orders, counseling the pt and coordinating her care   Caroleen Hamman, MD FACS General Surgeon 02/19/2021, 12:02 PM

## 2021-02-25 ENCOUNTER — Other Ambulatory Visit: Payer: Self-pay

## 2021-02-25 ENCOUNTER — Encounter
Admission: RE | Admit: 2021-02-25 | Discharge: 2021-02-25 | Disposition: A | Discharge status: Home / Self Care | Payer: 59 | Source: Ambulatory Visit | Attending: Surgery | Admitting: Surgery

## 2021-02-25 HISTORY — DX: Gastro-esophageal reflux disease without esophagitis: K21.9

## 2021-02-25 NOTE — Patient Instructions (Signed)
Your procedure is scheduled on: 03/02/21 Report to [place]. To find out your arrival time please call [phone] between 1PM - 3PM on 03/01/21.  Remember: Instructions that are not followed completely may result in serious medical risk, up to and including death, or upon the discretion of your surgeon and anesthesiologist your surgery may need to be rescheduled.     _X__ 1. Do not eat food after midnight the night before your procedure.                 No gum chewing or hard candies. You may drink clear liquids up to 2 hours                 before you are scheduled to arrive for your surgery- DO not drink clear                 liquids within 2 hours of the start of your surgery.                 Clear Liquids include:  water, apple juice without pulp, clear carbohydrate                 drink such as Clearfast or Gatorade, Black Coffee or Tea (Do not add                 anything to coffee or tea). Diabetics water only  __X__2.  On the morning of surgery brush your teeth with toothpaste and water, you                 may rinse your mouth with mouthwash if you wish.  Do not swallow any              toothpaste of mouthwash.     _X__ 3.  No Alcohol for 24 hours before or after surgery.   _X__ 4.  Do Not Smoke or use e-cigarettes For 24 Hours Prior to Your Surgery.                 Do not use any chewable tobacco products for at least 6 hours prior to                 surgery.  ____  5.  Bring all medications with you on the day of surgery if instructed.   __X__  6.  Notify your doctor if there is any change in your medical condition      (cold, fever, infections).     Do not wear jewelry, make-up, hairpins, clips or nail polish. Do not wear lotions, powders, or perfumes.  Do not shave body hair 48 hours prior to surgery. Men may shave face and neck. Do not bring valuables to the hospital.    [facility] is not responsible for any  belongings or valuables.  Contacts, dentures/partials or body piercings may not be worn into surgery. Bring a case for your contacts, glasses or hearing aids, a denture cup will be supplied. Leave your suitcase in the car. After surgery it may be brought to your room. For patients admitted to the hospital, discharge time is determined by your treatment team.   Patients discharged the day of surgery will not be allowed to drive home.   Please read over the following fact sheets that you were given:     __X__ Take these medicines the morning of surgery with A SIP OF WATER:  1. DULoxetine (CYMBALTA) 60 MG capsule  2. pantoprazole (PROTONIX) 40 MG tablet  3. sertraline (ZOLOFT) 50 MG tablet  4. topiramate (TOPAMAX) 50 MG tablet if needed  5.  6.  ____ Fleet Enema (as directed)   ____ Use CHG Soap/SAGE wipes as directed  ____ Use inhalers on the day of surgery  ____ Stop metformin/Janumet/Farxiga 2 days prior to surgery    ____ Take 1/2 of usual insulin dose the night before surgery. No insulin the morning          of surgery.   ____ Stop Blood Thinners Coumadin/Plavix/Xarelto/Pleta/Pradaxa/Eliquis/Effient/Aspirin  on   Or contact your Surgeon, Cardiologist or Medical Doctor regarding  ability to stop your blood thinners  __X__ Stop Anti-inflammatories 7 days before surgery such as Advil, Ibuprofen, Motrin,  BC or Goodies Powder, Naprosyn, Naproxen, Aleve, Aspirin   May take Tylenol   __X__ Stop all herbal supplements, fish oil or vitamin E until after surgery.    ____ Bring C-Pap to the hospital.

## 2021-03-02 ENCOUNTER — Other Ambulatory Visit: Payer: Self-pay

## 2021-03-02 ENCOUNTER — Ambulatory Visit
Admission: RE | Admit: 2021-03-02 | Discharge: 2021-03-02 | Disposition: A | Discharge status: Home / Self Care | Payer: 59 | Attending: Surgery | Admitting: Surgery

## 2021-03-02 ENCOUNTER — Encounter
Admission: RE | Disposition: A | Discharge status: Home / Self Care | Payer: Self-pay | Source: Home / Self Care | Attending: Surgery

## 2021-03-02 ENCOUNTER — Ambulatory Visit: Payer: 59 | Admitting: Certified Registered"

## 2021-03-02 ENCOUNTER — Encounter: Payer: Self-pay | Admitting: Surgery

## 2021-03-02 DIAGNOSIS — K620 Anal polyp: Secondary | ICD-10-CM

## 2021-03-02 DIAGNOSIS — K621 Rectal polyp: Secondary | ICD-10-CM | POA: Diagnosis not present

## 2021-03-02 DIAGNOSIS — A630 Anogenital (venereal) warts: Secondary | ICD-10-CM | POA: Diagnosis not present

## 2021-03-02 HISTORY — PX: TUMOR EXCISION: SHX421

## 2021-03-02 SURGERY — EXAM UNDER ANESTHESIA
Anesthesia: General

## 2021-03-02 MED ORDER — MIDAZOLAM HCL 2 MG/2ML IJ SOLN
INTRAMUSCULAR | Status: AC
  Filled 2021-03-02: qty 2

## 2021-03-02 MED ORDER — PROPOFOL 500 MG/50ML IV EMUL
INTRAVENOUS | Status: AC
  Filled 2021-03-02: qty 50

## 2021-03-02 MED ORDER — BUPIVACAINE LIPOSOME 1.3 % IJ SUSP
INTRAMUSCULAR | Status: AC
  Filled 2021-03-02: qty 20

## 2021-03-02 MED ORDER — DEXAMETHASONE SODIUM PHOSPHATE 10 MG/ML IJ SOLN
INTRAMUSCULAR | Status: DC | PRN
  Administered 2021-03-02: 10 mg via INTRAVENOUS

## 2021-03-02 MED ORDER — FENTANYL CITRATE (PF) 100 MCG/2ML IJ SOLN
INTRAMUSCULAR | Status: AC
  Filled 2021-03-02: qty 2

## 2021-03-02 MED ORDER — LIDOCAINE HCL (CARDIAC) PF 100 MG/5ML IV SOSY
PREFILLED_SYRINGE | INTRAVENOUS | Status: DC | PRN
  Administered 2021-03-02: 60 mg via INTRAVENOUS

## 2021-03-02 MED ORDER — FENTANYL CITRATE (PF) 100 MCG/2ML IJ SOLN
INTRAMUSCULAR | Status: DC | PRN
  Administered 2021-03-02: 25 ug via INTRAVENOUS
  Administered 2021-03-02: 50 ug via INTRAVENOUS
  Administered 2021-03-02: 25 ug via INTRAVENOUS

## 2021-03-02 MED ORDER — PHENYLEPHRINE HCL (PRESSORS) 10 MG/ML IV SOLN
INTRAVENOUS | Status: DC | PRN
  Administered 2021-03-02 (×2): 40 ug via INTRAVENOUS
  Administered 2021-03-02: 80 ug via INTRAVENOUS

## 2021-03-02 MED ORDER — ORAL CARE MOUTH RINSE: 15.0000 mL | Freq: Once | OROMUCOSAL | Status: AC

## 2021-03-02 MED ORDER — LIDOCAINE HCL (PF) 2 % IJ SOLN
INTRAMUSCULAR | Status: AC
  Filled 2021-03-02: qty 5

## 2021-03-02 MED ORDER — DEXAMETHASONE SODIUM PHOSPHATE 10 MG/ML IJ SOLN
INTRAMUSCULAR | Status: AC
  Filled 2021-03-02: qty 1

## 2021-03-02 MED ORDER — ONDANSETRON HCL 4 MG/2ML IJ SOLN
INTRAMUSCULAR | Status: AC
  Filled 2021-03-02: qty 2

## 2021-03-02 MED ORDER — CELECOXIB 200 MG PO CAPS
200.0000 mg | ORAL_CAPSULE | ORAL | Status: AC
  Administered 2021-03-02: 200 mg via ORAL

## 2021-03-02 MED ORDER — CHLORHEXIDINE GLUCONATE 0.12 % MT SOLN
OROMUCOSAL | Status: AC
  Administered 2021-03-02: 15 mL via OROMUCOSAL
  Filled 2021-03-02: qty 15

## 2021-03-02 MED ORDER — ONDANSETRON HCL 4 MG/2ML IJ SOLN: 4.0000 mg | Freq: Once | INTRAMUSCULAR | Status: DC | PRN

## 2021-03-02 MED ORDER — CHLORHEXIDINE GLUCONATE 0.12 % MT SOLN: 15.0000 mL | Freq: Once | OROMUCOSAL | Status: AC

## 2021-03-02 MED ORDER — GABAPENTIN 300 MG PO CAPS
300.0000 mg | ORAL_CAPSULE | ORAL | Status: AC
  Administered 2021-03-02: 300 mg via ORAL

## 2021-03-02 MED ORDER — CELECOXIB 200 MG PO CAPS
ORAL_CAPSULE | ORAL | Status: AC
  Filled 2021-03-02: qty 1

## 2021-03-02 MED ORDER — FENTANYL CITRATE (PF) 100 MCG/2ML IJ SOLN
25.0000 ug | INTRAMUSCULAR | Status: DC | PRN
  Administered 2021-03-02: 25 ug via INTRAVENOUS

## 2021-03-02 MED ORDER — CHLORHEXIDINE GLUCONATE CLOTH 2 % EX PADS: 6.0000 | MEDICATED_PAD | Freq: Once | CUTANEOUS | Status: DC

## 2021-03-02 MED ORDER — HYDROCODONE-ACETAMINOPHEN 5-325 MG PO TABS: 1.0000 | ORAL_TABLET | ORAL | 0 refills | Status: DC | PRN

## 2021-03-02 MED ORDER — ACETAMINOPHEN 500 MG PO TABS
1000.0000 mg | ORAL_TABLET | ORAL | Status: AC
  Administered 2021-03-02: 1000 mg via ORAL

## 2021-03-02 MED ORDER — BUPIVACAINE LIPOSOME 1.3 % IJ SUSP
INTRAMUSCULAR | Status: DC | PRN
  Administered 2021-03-02: 20 mL

## 2021-03-02 MED ORDER — ONDANSETRON HCL 4 MG/2ML IJ SOLN
INTRAMUSCULAR | Status: DC | PRN
  Administered 2021-03-02: 4 mg via INTRAVENOUS

## 2021-03-02 MED ORDER — GABAPENTIN 300 MG PO CAPS
ORAL_CAPSULE | ORAL | Status: AC
  Filled 2021-03-02: qty 1

## 2021-03-02 MED ORDER — LACTATED RINGERS IV SOLN: INTRAVENOUS | Status: DC

## 2021-03-02 MED ORDER — GLYCOPYRROLATE 0.2 MG/ML IJ SOLN
INTRAMUSCULAR | Status: DC | PRN
  Administered 2021-03-02: .2 mg via INTRAVENOUS

## 2021-03-02 MED ORDER — ACETAMINOPHEN 500 MG PO TABS
ORAL_TABLET | ORAL | Status: AC
  Filled 2021-03-02: qty 2

## 2021-03-02 MED ORDER — HYDROCODONE-ACETAMINOPHEN 5-325 MG PO TABS
ORAL_TABLET | ORAL | 0 refills | Status: DC
  Filled 2021-03-02: qty 12, 2d supply, fill #0

## 2021-03-02 MED ORDER — MIDAZOLAM HCL 2 MG/2ML IJ SOLN
INTRAMUSCULAR | Status: DC | PRN
  Administered 2021-03-02: 2 mg via INTRAVENOUS

## 2021-03-02 MED ORDER — PROPOFOL 10 MG/ML IV BOLUS
INTRAVENOUS | Status: DC | PRN
  Administered 2021-03-02: 50 mg via INTRAVENOUS
  Administered 2021-03-02: 200 mg via INTRAVENOUS

## 2021-03-02 SURGICAL SUPPLY — 29 items
BLADE CLIPPER SURG (BLADE) IMPLANT
BLADE SURG 15 STRL LF DISP TIS (BLADE) ×1 IMPLANT
BLADE SURG 15 STRL SS (BLADE) ×3
BRIEF STRETCH FOR OB PAD XXL (UNDERPADS AND DIAPERS) ×3 IMPLANT
DRAPE 3/4 80X56 (DRAPES) IMPLANT
DRAPE LAPAROTOMY 77X122 PED (DRAPES) ×3 IMPLANT
DRAPE LEGGINS SURG 28X43 STRL (DRAPES) ×3 IMPLANT
DRAPE UNDER BUTTOCK W/FLU (DRAPES) ×3 IMPLANT
DRSG GAUZE FLUFF 36X18 (GAUZE/BANDAGES/DRESSINGS) ×3 IMPLANT
ELECT CAUTERY BLADE 6.4 (BLADE) ×3 IMPLANT
ELECT REM PT RETURN 9FT ADLT (ELECTROSURGICAL) ×3
ELECTRODE REM PT RTRN 9FT ADLT (ELECTROSURGICAL) ×1 IMPLANT
GAUZE 4X4 16PLY ~~LOC~~+RFID DBL (SPONGE) ×3 IMPLANT
GLOVE SURG ENC MOIS LTX SZ7 (GLOVE) ×9 IMPLANT
GOWN STRL REUS W/ TWL LRG LVL3 (GOWN DISPOSABLE) ×2 IMPLANT
GOWN STRL REUS W/TWL LRG LVL3 (GOWN DISPOSABLE) ×6
MANIFOLD NEPTUNE II (INSTRUMENTS) ×3 IMPLANT
NEEDLE HYPO 22GX1.5 SAFETY (NEEDLE) ×3 IMPLANT
NS IRRIG 1000ML POUR BTL (IV SOLUTION) IMPLANT
NS IRRIG 500ML POUR BTL (IV SOLUTION) ×3 IMPLANT
PACK BASIN MINOR ARMC (MISCELLANEOUS) ×3 IMPLANT
PAD PREP 24X41 OB/GYN DISP (PERSONAL CARE ITEMS) ×3 IMPLANT
SHEARS HARMONIC 9CM CVD (BLADE) IMPLANT
SOL PREP PVP 2OZ (MISCELLANEOUS) ×3
SOLUTION PREP PVP 2OZ (MISCELLANEOUS) ×1 IMPLANT
SPONGE T-LAP 18X18 ~~LOC~~+RFID (SPONGE) ×3 IMPLANT
SURGILUBE 2OZ TUBE FLIPTOP (MISCELLANEOUS) ×3 IMPLANT
SYR 20ML LL LF (SYRINGE) ×3 IMPLANT
WATER STERILE IRR 500ML POUR (IV SOLUTION) IMPLANT

## 2021-03-02 NOTE — Op Note (Signed)
  03/02/2021  1:28 PM  PATIENT:  Carol Mooney  45 y.o. female  PRE-OPERATIVE DIAGNOSIS: Rectal polyp  POST-OPERATIVE DIAGNOSIS:  Same  PROCEDURE:  1. Exam under anesthesia and transrectal excision of 1 cm rectal polyp    SURGEON:  Surgeon(s) and Role:    * Lyndzie Zentz F, MD - Primary  FINDINGS: 1 Cm rectal polyp Right anterolateral position ( no evidence of malignant features  ANESTHESIA: GEneral LMA  EBL: minimal   DICTATION:  Patient was explained  about the  procedure in detail. Risks, benefits, possible complications and a consent was obtained. The patient taken to the operating room and placed in the lithotomy  position w all pressure points padded. She was prepped and draped in the standard fashion.  Exam revealed anterolateral polyp , we elevated the polyp with an allis and using cautery excised it. Hemostasis was obtained with electrocautery.  Liposomal marcaine injected for post op analgesia.  Needle and laparotomy counts were correct and there were no immediate complications  Jules Husbands, MD

## 2021-03-02 NOTE — Anesthesia Preprocedure Evaluation (Signed)
Anesthesia Evaluation  Patient identified by MRN, date of birth, ID band Patient awake    Reviewed: Allergy & Precautions, H&P , NPO status , Patient's Chart, lab work & pertinent test results, reviewed documented beta blocker date and time   History of Anesthesia Complications (+) PONV and history of anesthetic complications  Airway Mallampati: II  TM Distance: >3 FB Neck ROM: full    Dental  (+) Teeth Intact   Pulmonary neg pulmonary ROS, former smoker,    Pulmonary exam normal        Cardiovascular Exercise Tolerance: Good hypertension, On Medications negative cardio ROS Normal cardiovascular exam Rhythm:regular Rate:Normal     Neuro/Psych  Headaches, PSYCHIATRIC DISORDERS Anxiety Depression    GI/Hepatic Neg liver ROS, GERD  Medicated,  Endo/Other  negative endocrine ROS  Renal/GU negative Renal ROS  negative genitourinary   Musculoskeletal   Abdominal   Peds  Hematology negative hematology ROS (+)   Anesthesia Other Findings Past Medical History: No date: Allergy No date: Anxiety No date: Complication of anesthesia     Comment:  nausea and vomiting No date: Depression No date: GERD (gastroesophageal reflux disease) No date: Hypertension     Comment:  when having migraines 08/14/2017: Meningioma ([place]) No date: Meningioma ([place])     Comment:  frontal lobe No date: PONV (postoperative nausea and vomiting) Past Surgical History: No date: ABDOMINAL HYSTERECTOMY No date: BRAIN SURGERY     Comment:  patient denies surgery No date: c sections     Comment:  x 2 01/07/2021: COLONOSCOPY WITH PROPOFOL; N/A     Comment:  Procedure: COLONOSCOPY WITH PROPOFOL;  Surgeon: Jonathon Bellows, MD;  Location: [facility] ENDOSCOPY;  Service:               Gastroenterology;  Laterality: N/A; No date: CORONARY ARTERY BYPASS GRAFT 12/07/2015: HYSTERECTOMY ABDOMINAL WITH SALPINGECTOMY; Bilateral     Comment:   Procedure: HYSTERECTOMY ABDOMINAL WITH               SALPINGECTOMY/EXCISION OF ENDOMETRIOS;  Surgeon: Boykin Nearing, MD;  Location: [facility] ORS;  Service:               Gynecology;  Laterality: Bilateral; 12/07/2015: OOPHORECTOMY; Bilateral     Comment:  Procedure: OOPHORECTOMY;  Surgeon: Boykin Nearing, MD;  Location: [facility] ORS;  Service:               Gynecology;  Laterality: Bilateral; 04/18/1998: TUBAL LIGATION 12/07/2015: UTERINE STENT PLACEMENT; Bilateral     Comment:  Procedure: UTERINE STENT PLACEMENT/REMOVAL;  Surgeon:               Boykin Nearing, MD;  Location: [facility] ORS;  Service:              Gynecology;  Laterality: Bilateral; BMI    Body Mass Index: 26.29 kg/m     Reproductive/Obstetrics negative OB ROS                             Anesthesia Physical Anesthesia Plan  ASA: 2  Anesthesia Plan: General ETT   Post-op Pain Management:    Induction:   PONV Risk Score and Plan:   Airway Management Planned:  Additional Equipment:   Intra-op Plan:   Post-operative Plan:   Informed Consent: I have reviewed the patients History and Physical, chart, labs and discussed the procedure including the risks, benefits and alternatives for the proposed anesthesia with the patient or authorized representative who has indicated his/her understanding and acceptance.     Dental Advisory Given  Plan Discussed with: CRNA  Anesthesia Plan Comments:         Anesthesia Quick Evaluation

## 2021-03-02 NOTE — Transfer of Care (Addendum)
Immediate Anesthesia Transfer of Care Note  Patient: Carol Mooney  Procedure(s) Performed: EXAM UNDER ANESTHESIA TUMOR EXCISION RECTAL, excision polyp  Patient Location: PACU  Anesthesia Type:General  Level of Consciousness: alert   Airway & Oxygen Therapy: Patient connected to nasal cannula oxygen and Patient connected to face mask oxygen  Post-op Assessment: Report given to RN  Post vital signs: stable  Last Vitals:  Vitals Value Taken Time  BP    Temp    Pulse    Resp    SpO2      Last Pain:  Vitals:   03/02/21 1113  TempSrc: Temporal  PainSc: 0-No pain         Complications: No notable events documented.

## 2021-03-02 NOTE — Interval H&P Note (Signed)
History and Physical Interval Note:  03/02/2021 12:17 PM  Carol Mooney  has presented today for surgery, with the diagnosis of rectal polyp.  The various methods of treatment have been discussed with the patient and family. After consideration of risks, benefits and other options for treatment, the patient has consented to  Procedure(s): EXAM UNDER ANESTHESIA (N/A) TUMOR EXCISION RECTAL, excision polyp (N/A) as a surgical intervention.  The patient's history has been reviewed, patient examined, no change in status, stable for surgery.  I have reviewed the patient's chart and labs.  Questions were answered to the patient's satisfaction.     [place]

## 2021-03-02 NOTE — Progress Notes (Signed)
Given a fleets enema to pt this am per order.  Continue to monitor

## 2021-03-02 NOTE — Anesthesia Procedure Notes (Signed)
Procedure Name: LMA Insertion Date/Time: 03/02/2021 1:06 PM Performed by: Justin Mend, RN Pre-anesthesia Checklist: Patient identified, Patient being monitored, Timeout performed, Emergency Drugs available and Suction available Patient Re-evaluated:Patient Re-evaluated prior to induction Oxygen Delivery Method: Circle system utilized Preoxygenation: Pre-oxygenation with 100% oxygen Induction Type: IV induction Ventilation: Mask ventilation without difficulty LMA: LMA inserted LMA Size: 3.0 Tube type: Oral Number of attempts: 1 Placement Confirmation: positive ETCO2 and breath sounds checked- equal and bilateral Tube secured with: Tape Dental Injury: Teeth and Oropharynx as per pre-operative assessment

## 2021-03-03 ENCOUNTER — Encounter: Payer: Self-pay | Admitting: Surgery

## 2021-03-03 ENCOUNTER — Telehealth: Payer: Self-pay

## 2021-03-03 LAB — SURGICAL PATHOLOGY

## 2021-03-03 NOTE — Telephone Encounter (Signed)
Spoke with the patient and let her know that per Dr Dahlia Byes her pathology was benign and I scheduled her for a post op follow up appointment with Dr Dahlia Byes.

## 2021-03-04 NOTE — Anesthesia Postprocedure Evaluation (Signed)
Anesthesia Post Note  Patient: Carol Mooney  Procedure(s) Performed: EXAM UNDER ANESTHESIA TUMOR EXCISION RECTAL, excision polyp  Patient location during evaluation: PACU Anesthesia Type: General Level of consciousness: awake and alert Pain management: pain level controlled Vital Signs Assessment: post-procedure vital signs reviewed and stable Respiratory status: spontaneous breathing, nonlabored ventilation, respiratory function stable and patient connected to nasal cannula oxygen Cardiovascular status: blood pressure returned to baseline and stable Postop Assessment: no apparent nausea or vomiting Anesthetic complications: no   No notable events documented.   Last Vitals:  Vitals:   03/02/21 1445 03/02/21 1459  BP: 118/70 123/73  Pulse: 62 (!) 55  Resp: 15 18  Temp:  (!) 36.2 C  SpO2: 100% 100%    Last Pain:  Vitals:   03/02/21 1459  TempSrc: Temporal  PainSc: 0-No pain                 Molli Barrows

## 2021-03-22 ENCOUNTER — Encounter: Payer: 59 | Admitting: Surgery

## 2021-03-23 ENCOUNTER — Other Ambulatory Visit: Payer: Self-pay

## 2021-03-23 DIAGNOSIS — F4311 Post-traumatic stress disorder, acute: Secondary | ICD-10-CM | POA: Diagnosis not present

## 2021-03-23 DIAGNOSIS — F330 Major depressive disorder, recurrent, mild: Secondary | ICD-10-CM | POA: Diagnosis not present

## 2021-03-23 DIAGNOSIS — F5105 Insomnia due to other mental disorder: Secondary | ICD-10-CM | POA: Diagnosis not present

## 2021-03-23 DIAGNOSIS — F411 Generalized anxiety disorder: Secondary | ICD-10-CM | POA: Diagnosis not present

## 2021-03-23 MED ORDER — TRAZODONE HCL 50 MG PO TABS
ORAL_TABLET | ORAL | 1 refills | Status: DC
  Filled 2021-03-23: qty 90, 90d supply, fill #0

## 2021-03-23 MED ORDER — SERTRALINE HCL 50 MG PO TABS
ORAL_TABLET | ORAL | 1 refills | Status: DC
  Filled 2021-03-23: qty 90, 90d supply, fill #0

## 2021-03-23 MED ORDER — DULOXETINE HCL 60 MG PO CPEP
ORAL_CAPSULE | ORAL | 1 refills | Status: DC
  Filled 2021-03-23 – 2021-05-19 (×2): qty 90, 90d supply, fill #0

## 2021-04-05 ENCOUNTER — Ambulatory Visit: Payer: 59 | Admitting: Gastroenterology

## 2021-04-05 ENCOUNTER — Telehealth: Payer: Self-pay | Admitting: Gastroenterology

## 2021-04-05 NOTE — Telephone Encounter (Signed)
LVM for patient to call our office to reschedule appt. Provider will be out of the office.

## 2021-04-08 ENCOUNTER — Other Ambulatory Visit: Payer: Self-pay

## 2021-04-13 ENCOUNTER — Ambulatory Visit
Admission: EM | Admit: 2021-04-13 | Discharge: 2021-04-13 | Disposition: A | Discharge status: Home / Self Care | Payer: 59 | Attending: Emergency Medicine | Admitting: Emergency Medicine

## 2021-04-13 ENCOUNTER — Encounter: Payer: Self-pay | Admitting: Licensed Clinical Social Worker

## 2021-04-13 ENCOUNTER — Other Ambulatory Visit: Payer: Self-pay

## 2021-04-13 DIAGNOSIS — R051 Acute cough: Secondary | ICD-10-CM | POA: Insufficient documentation

## 2021-04-13 DIAGNOSIS — Z20822 Contact with and (suspected) exposure to covid-19: Secondary | ICD-10-CM | POA: Insufficient documentation

## 2021-04-13 DIAGNOSIS — J069 Acute upper respiratory infection, unspecified: Secondary | ICD-10-CM | POA: Insufficient documentation

## 2021-04-13 DIAGNOSIS — R0981 Nasal congestion: Secondary | ICD-10-CM | POA: Diagnosis present

## 2021-04-13 DIAGNOSIS — R519 Headache, unspecified: Secondary | ICD-10-CM | POA: Insufficient documentation

## 2021-04-13 LAB — RAPID INFLUENZA A&B ANTIGENS
Influenza A (ARMC): NEGATIVE
Influenza B (ARMC): NEGATIVE

## 2021-04-13 MED ORDER — PROMETHAZINE-DM 6.25-15 MG/5ML PO SYRP
5.0000 mL | ORAL_SOLUTION | Freq: Four times a day (QID) | ORAL | 0 refills | Status: DC | PRN
  Filled 2021-04-13: qty 118, 6d supply, fill #0

## 2021-04-13 MED ORDER — BENZONATATE 100 MG PO CAPS
200.0000 mg | ORAL_CAPSULE | Freq: Three times a day (TID) | ORAL | 0 refills | Status: DC
  Filled 2021-04-13: qty 21, 4d supply, fill #0

## 2021-04-13 MED ORDER — IPRATROPIUM BROMIDE 0.06 % NA SOLN
2.0000 | Freq: Four times a day (QID) | NASAL | 12 refills | Status: DC
  Filled 2021-04-13: qty 15, 19d supply, fill #0

## 2021-04-13 NOTE — ED Triage Notes (Signed)
Pt c/o headache, fever of 101 since last night, congestion with some mucus. Lower back pain also.

## 2021-04-13 NOTE — Discharge Instructions (Addendum)
Isolate at home pending the results of your COVID test.  If you test positive then you will have to quarantine for 5 days from the start of your symptoms.  After 5 days you can break quarantine if your symptoms have improved and you have not had a fever for 24 hours without taking Tylenol or ibuprofen.  Use over-the-counter Tylenol and ibuprofen as needed for body aches and fever.  Use the Atrovent nasal spray, 2 squirts in each nostril every 6 hours, as needed for runny nose and postnasal drip.  Use the Tessalon Perles every 8 hours during the day.  Take them with a small sip of water.  They may give you some numbness to the base of your tongue or a metallic taste in your mouth, this is normal.  Use the Promethazine DM cough syrup at bedtime for cough and congestion.  It will make you drowsy so do not take it during the day.  Return for reevaluation or see your primary care provider for any new or worsening symptoms.   If you develop any increased shortness of breath-especially at rest, you are unable to speak in full sentences, or is a late sign your lips are turning blue you need to go the ER for evaluation.

## 2021-04-13 NOTE — ED Provider Notes (Signed)
[facility]    CSN: [id number] Arrival date & time: 04/13/21  0920      History   Chief Complaint Chief Complaint  Patient presents with   Headache   Fever   Nasal Congestion    HPI Carol Mooney is a 45 y.o. female.   HPI  32 old female here for evaluation of fever and respiratory complaints.  Patient reports that she has had symptoms for last 2 days which consist of a fever with a T-max of 101, headache, nasal congestion with intermittent nasal discharge, ear pain, intermittently productive cough or scant sputum, and low back pain.  She denies sore throat, shortness breath or wheezing, GI complaints, painful urination or urinary urgency or frequency.  She has been exposed to flu and COVID at work as well as flu through family.  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  Past Medical History:  Diagnosis Date   Allergy    Anxiety    Complication of anesthesia    nausea and vomiting   Depression    GERD (gastroesophageal reflux disease)    Hypertension    when having migraines   Meningioma ([place]) 08/14/2017   Meningioma ([place])    frontal lobe   PONV (postoperative nausea and vomiting)     Patient Active Problem List   Diagnosis Date Noted   History of alcohol use disorder 01/07/2020   Intractable chronic migraine without aura and without status migrainosus 01/07/2020   Lateral epicondylitis of right elbow 07/17/2019   Meningioma ([place]) 08/14/2017   Depression, major, recurrent, moderate ([place]) 03/22/2017   History of MRSA infection 02/11/2016   Endometriosis 09/01/2015    Past Surgical History:  Procedure Laterality Date   ABDOMINAL HYSTERECTOMY     c sections     x 2   COLONOSCOPY WITH PROPOFOL N/A 01/07/2021   Procedure: COLONOSCOPY WITH PROPOFOL;  Surgeon: Jonathon Bellows, MD;  Location: [facility] ENDOSCOPY;  Service: Gastroenterology;  Laterality: N/A;   CORONARY ARTERY BYPASS GRAFT      HYSTERECTOMY ABDOMINAL WITH SALPINGECTOMY Bilateral 12/07/2015   Procedure: HYSTERECTOMY ABDOMINAL WITH SALPINGECTOMY/EXCISION OF ENDOMETRIOS;  Surgeon: Boykin Nearing, MD;  Location: [facility] ORS;  Service: Gynecology;  Laterality: Bilateral;   OOPHORECTOMY Bilateral 12/07/2015   Procedure: OOPHORECTOMY;  Surgeon: Boykin Nearing, MD;  Location: [facility] ORS;  Service: Gynecology;  Laterality: Bilateral;   TUBAL LIGATION  04/18/1998   TUMOR EXCISION N/A 03/02/2021   Procedure: TUMOR EXCISION RECTAL, excision polyp;  Surgeon: Jules Husbands, MD;  Location: [facility] ORS;  Service: General;  Laterality: N/A;   UTERINE STENT PLACEMENT Bilateral 12/07/2015   Procedure: UTERINE STENT PLACEMENT/REMOVAL;  Surgeon: Boykin Nearing, MD;  Location: [facility] ORS;  Service: Gynecology;  Laterality: Bilateral;    OB History   No obstetric history on file.      Home Medications    Prior to Admission medications   Medication Sig Start Date End Date Taking? Authorizing Provider  acetaminophen (TYLENOL) 325 MG tablet Take 650 mg by mouth every 6 (six) hours as needed for moderate pain.   Yes [provider]  benzonatate (TESSALON) 100 MG capsule Take 2 capsules (200 mg total) by mouth every 8 (eight) hours. 04/13/21  Yes Margarette Canada, NP  DULoxetine (CYMBALTA) 60 MG capsule TAKE 1 CAPSULE BY MOUTH DAILY AT 8:00AM 06/19/20 06/19/21 Yes Chauncey Mann, MD  DULoxetine (CYMBALTA) 60 MG capsule Take 1 daily at  8:00am 03/23/21  Yes   HYDROcodone-acetaminophen (NORCO/VICODIN) 5-325 MG tablet Take 1 tablet by mouth every 4 (four) hours as needed for moderate pain. 03/02/21  Yes Pabon, Diego F, MD  HYDROcodone-acetaminophen (NORCO/VICODIN) 5-325 MG tablet Take 1 tablet by mouth every 4 hours as needed for moderate pain 03/02/21  Yes Pabon, [place] F, MD  ipratropium (ATROVENT) 0.06 % nasal spray Place 2 sprays into both nostrils 4 (four) times daily. 04/13/21  Yes Margarette Canada, NP  linaclotide Rolan Lipa) 290  MCG CAPS capsule Take 1 capsule (290 mcg total) by mouth daily before breakfast. 01/25/21  Yes Jonathon Bellows, MD  NONFORMULARY OR COMPOUNDED ITEM Apply 1 application topically in the morning, at noon, and at bedtime. Nifedipine 0.3%   Yes [provider]  pantoprazole (PROTONIX) 40 MG tablet Take 1 tablet (40 mg total) by mouth daily. 10/16/20 10/16/21 Yes Ward, Kristen N, DO  promethazine-dextromethorphan (PROMETHAZINE-DM) 6.25-15 MG/5ML syrup Take 5 mLs by mouth 4 (four) times daily as needed. 04/13/21  Yes Margarette Canada, NP  sennosides-docusate sodium (SENOKOT-S) 8.6-50 MG tablet Take 1 tablet by mouth daily as needed for constipation.   Yes [provider]  sertraline (ZOLOFT) 50 MG tablet Take 50 mg by mouth daily. 04/17/19  Yes [provider]  sertraline (ZOLOFT) 50 MG tablet Take 1 daily with breakfast 03/23/21  Yes   topiramate (TOPAMAX) 50 MG tablet Take 50 mg by mouth 2 (two) times daily as needed (migraines).   Yes [provider]  traZODone (DESYREL) 50 MG tablet Take 1 tablet by mouth at bedtime as needed 07/20/20  Yes   traZODone (DESYREL) 50 MG tablet Take 1 at bedtime as needed 03/23/21  Yes     Family History Family History  Problem Relation Age of Onset   Coronary artery disease Father    Depression Father    Diabetes Father        insulin dependent   Depression Brother    Cancer Maternal Grandmother        breast   Breast cancer Maternal Grandmother 60   Depression Sister    Breast cancer Paternal Grandmother 82    Social History Social History   Tobacco Use   Smoking status: Former    Packs/day: 1.00    Types: Cigarettes    Quit date: 05/20/2019    Years since quitting: 1.9   Smokeless tobacco: Never  Vaping Use   Vaping Use: Every day   Substances: Nicotine, Flavoring  Substance Use Topics   Alcohol use: Not Currently   Drug use: No     Allergies   Morphine, Morphine and related, and Shellfish allergy   Review of  Systems Review of Systems  Constitutional:  Positive for fever. Negative for activity change and appetite change.  HENT:  Positive for congestion, ear pain and rhinorrhea. Negative for sore throat.   Respiratory:  Positive for cough. Negative for shortness of breath and wheezing.   Gastrointestinal:  Negative for diarrhea, nausea and vomiting.  Musculoskeletal:  Positive for back pain.  Skin:  Negative for rash.  Neurological:  Positive for headaches.  Hematological: Negative.   Psychiatric/Behavioral: Negative.      Physical Exam Triage Vital Signs ED Triage Vitals  Enc Vitals Group     BP 04/13/21 0953 (!) 125/92     Pulse Rate 04/13/21 0953 78     Resp 04/13/21 0953 16     Temp 04/13/21 0953 98.4 F (36.9 C)     Temp Source 04/13/21  2671 Oral     SpO2 04/13/21 0953 100 %     Weight 04/13/21 0951 158 lb 1.1 oz (71.7 kg)     Height 04/13/21 0951 5\' 5"  (1.651 m)     Head Circumference --      Peak Flow --      Pain Score 04/13/21 0951 4     Pain Loc --      Pain Edu? --      Excl. in [place]? --    No data found.  Updated Vital Signs BP (!) 125/92 (BP Location: Left Arm)    Pulse 78    Temp 98.4 F (36.9 C) (Oral)    Resp 16    Ht 5\' 5"  (1.651 m)    Wt 158 lb 1.1 oz (71.7 kg)    LMP 10/11/2015    SpO2 100%    BMI 26.30 kg/m   Visual Acuity Right Eye Distance:   Left Eye Distance:   Bilateral Distance:    Right Eye Near:   Left Eye Near:    Bilateral Near:     Physical Exam Vitals and nursing note reviewed.  Constitutional:      Appearance: Normal appearance. She is well-developed. She is not ill-appearing.  HENT:     Head: Normocephalic and atraumatic.     Right Ear: Tympanic membrane, ear canal and external ear normal. There is no impacted cerumen.     Left Ear: Tympanic membrane, ear canal and external ear normal. There is no impacted cerumen.     Nose: Congestion and rhinorrhea present.     Mouth/Throat:     Mouth: Mucous membranes are moist.     Pharynx:  Oropharynx is clear. Posterior oropharyngeal erythema present.     Comments: Lesion on the upper and lower lip on the right side. Cardiovascular:     Rate and Rhythm: Normal rate and regular rhythm.     Pulses: Normal pulses.     Heart sounds: Normal heart sounds. No murmur heard.   No gallop.  Pulmonary:     Effort: Pulmonary effort is normal.     Breath sounds: Normal breath sounds. No wheezing, rhonchi or rales.  Musculoskeletal:     Cervical back: Normal range of motion and neck supple.  Lymphadenopathy:     Cervical: No cervical adenopathy.  Skin:    General: Skin is warm and dry.     Capillary Refill: Capillary refill takes less than 2 seconds.     Findings: No erythema or rash.  Neurological:     General: No focal deficit present.     Mental Status: She is alert and oriented to person, place, and time.  Psychiatric:        Mood and Affect: Mood normal.        Behavior: Behavior normal.        Thought Content: Thought content normal.        Judgment: Judgment normal.     UC Treatments / Results  Labs (all labs ordered are listed, but only abnormal results are displayed) Labs Reviewed  RAPID INFLUENZA A&B ANTIGENS  SARS CORONAVIRUS 2 (TAT 6-24 HRS)    EKG   Radiology No results found.  Procedures Procedures (including critical care time)  Medications Ordered in UC Medications - No data to display  Initial Impression / Assessment and Plan / UC Course  I have reviewed the triage vital signs and the nursing notes.  Pertinent labs & imaging results that were available  during my care of the patient were reviewed by me and considered in my medical decision making (see chart for details).  Patient very pleasant, nontoxic-appearing 45 year old female here for evaluation of fever and respiratory complaints that began 2 days ago.  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  She states that she is always wearing a mask.  She  also has family members who have tested positive for influenza.  She does not think that she was around any of her family when they were experiencing flu symptoms though.  On physical exam patient has pearly gray tympanic membranes bilaterally with normal light reflex and clear external auditory canals.  Nasal mucosa is erythematous and edematous with scant clear nasal discharge in both nares.  Oropharyngeal exam reveals mild posterior oropharyngeal erythema with clear postnasal drip.  No injection noted.  Patient does have a cold sore on her upper and lower lip on the right-hand side.  No cervical lymphadenopathy appreciated exam.  Cardiopulmonary exam reveals clear lung sounds in all fields.  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  We will check a rapid flu here and send Her to the hospital for evaluation.  Patient knows that she needs to isolate pending the results of the COVID test.  I will give her a work note to cover that timeframe.  Rapid influenza is negative.  COVID PCR is pending.  I will discharge patient home to isolate pending results of her COVID test.  I will give her Atrovent nasal spray to help with the nasal congestion, Tessalon Perles and Promethazine DM cough subtype with cough and congestion.  Work note provided.  Patient test positive we will treat her with molnupiravir.   Final Clinical Impressions(s) / UC Diagnoses   Final diagnoses:  Viral URI with cough     Discharge Instructions      Isolate at home pending the results of your COVID test.  If you test positive then you will have to quarantine for 5 days from the start of your symptoms.  After 5 days you can break quarantine if your symptoms have improved and you have not had a fever for 24 hours without taking Tylenol or ibuprofen.  Use over-the-counter Tylenol and ibuprofen as needed for body aches and fever.  Use the Atrovent nasal spray, 2 squirts in each nostril every 6  hours, as needed for runny nose and postnasal drip.  Use the Tessalon Perles every 8 hours during the day.  Take them with a small sip of water.  They may give you some numbness to the base of your tongue or a metallic taste in your mouth, this is normal.  Use the Promethazine DM cough syrup at bedtime for cough and congestion.  It will make you drowsy so do not take it during the day.  Return for reevaluation or see your primary care provider for any new or worsening symptoms.   If you develop any increased shortness of breath-especially at rest, you are unable to speak in full sentences, or is a late sign your lips are turning blue you need to go the ER for evaluation.      ED Prescriptions     Medication Sig Dispense Auth. Provider   benzonatate (TESSALON) 100 MG capsule Take 2 capsules (200 mg total) by mouth every 8 (eight) hours. 21 capsule Margarette Canada, NP   ipratropium (ATROVENT)  0.06 % nasal spray Place 2 sprays into both nostrils 4 (four) times daily. 15 mL Margarette Canada, NP   promethazine-dextromethorphan (PROMETHAZINE-DM) 6.25-15 MG/5ML syrup Take 5 mLs by mouth 4 (four) times daily as needed. 118 mL Margarette Canada, NP      PDMP not reviewed this encounter.   Margarette Canada, NP 04/13/21 [phone]

## 2021-04-14 LAB — SARS CORONAVIRUS 2 (TAT 6-24 HRS): SARS Coronavirus 2: NEGATIVE

## 2021-04-29 ENCOUNTER — Ambulatory Visit: Payer: Self-pay | Admitting: *Deleted

## 2021-04-29 NOTE — Telephone Encounter (Signed)
° °  Chief Complaint: fever, covid symptoms. Requesting appt  Symptoms: fever 101.1, headache, head congestion, blowing nose for clear mucus, dry cough,  Frequency: started yesterday  Pertinent Negatives: Patient denies chest pain  difficulty breathing Disposition: [] ED /[x] Urgent Care (no appt availability in office) / [] Appointment(In office/virtual)/ [x]  [facility]/ [] Home Care/ [] Refused Recommended Disposition /[] [place] [facility]/ []  Follow-up with PCP Additional Notes:  Recommended to get covid tested. Calling [facility] with [facility] . Recommended [facility] diagnostic testing , [facility] pharmacy.  Telehealth visit with [facility]    Reason for Disposition  Fever present > 3 days (72 hours)  Answer Assessment - Initial Assessment Questions 1. COVID-19 DIAGNOSIS: "Who made your COVID-19 diagnosis?" "Was it confirmed by a positive lab test or self-test?" If not diagnosed by a doctor (or NP/PA), ask "Are there lots of cases (community spread) where you live?" Note: See public health department website, if unsure.     Not tested at this time  2. COVID-19 EXPOSURE: "Was there any known exposure to COVID before the symptoms began?" CDC Definition of close contact: within 6 feet (2 meters) for a total of 15 minutes or more over a 24-hour period.      Works as a Fish farm manager. ONSET: "When did the COVID-19 symptoms start?"      Yesterday  4. WORST SYMPTOM: "What is your worst symptom?" (e.g., cough, fever, shortness of breath, muscle aches)     Fever, head congestion , body aches 5. COUGH: "Do you have a cough?" If Yes, ask: "How bad is the cough?"       Yes dry cough  6. FEVER: "Do you have a fever?" If Yes, ask: "What is your temperature, how was it measured, and when did it start?"     101.1 7. RESPIRATORY STATUS: "Describe your breathing?" (e.g., shortness of breath, wheezing, unable to speak)      no 8. BETTER-SAME-WORSE: "Are you getting better, staying the same or getting worse compared to  yesterday?"  If getting worse, ask, "In what way?"     Na  9. HIGH RISK DISEASE: "Do you have any chronic medical problems?" (e.g., asthma, heart or lung disease, weak immune system, obesity, etc.)     no 10. VACCINE: "Have you had the COVID-19 vaccine?" If Yes, ask: "Which one, how many shots, when did you get it?"       X 2 Pfizer  11. BOOSTER: "Have you received your COVID-19 booster?" If Yes, ask: "Which one and when did you get it?"       na 12. PREGNANCY: "Is there any chance you are pregnant?" "When was your last menstrual period?"       Nan 13. OTHER SYMPTOMS: "Do you have any other symptoms?"  (e.g., chills, fatigue, headache, loss of smell or taste, muscle pain, sore throat)       Headache , fever 101.1, body aches , blowing nose clear mucus  14. O2 SATURATION MONITOR:  "Do you use an oxygen saturation monitor (pulse oximeter) at home?" If Yes, ask "What is your reading (oxygen level) today?" "What is your usual oxygen saturation reading?" (e.g., 95%)       na  Protocols used: Coronavirus (COVID-19) Diagnosed or Suspected-A-AH

## 2021-05-19 ENCOUNTER — Other Ambulatory Visit: Payer: Self-pay

## 2021-05-20 ENCOUNTER — Other Ambulatory Visit: Payer: Self-pay

## 2021-06-28 DIAGNOSIS — M542 Cervicalgia: Secondary | ICD-10-CM | POA: Diagnosis not present

## 2021-06-28 DIAGNOSIS — M9904 Segmental and somatic dysfunction of sacral region: Secondary | ICD-10-CM | POA: Diagnosis not present

## 2021-06-28 DIAGNOSIS — M9901 Segmental and somatic dysfunction of cervical region: Secondary | ICD-10-CM | POA: Diagnosis not present

## 2021-06-28 DIAGNOSIS — M5451 Vertebrogenic low back pain: Secondary | ICD-10-CM | POA: Diagnosis not present

## 2021-06-28 DIAGNOSIS — M461 Sacroiliitis, not elsewhere classified: Secondary | ICD-10-CM | POA: Diagnosis not present

## 2021-06-28 DIAGNOSIS — M9903 Segmental and somatic dysfunction of lumbar region: Secondary | ICD-10-CM | POA: Diagnosis not present

## 2021-06-29 DIAGNOSIS — M5451 Vertebrogenic low back pain: Secondary | ICD-10-CM | POA: Diagnosis not present

## 2021-06-29 DIAGNOSIS — M461 Sacroiliitis, not elsewhere classified: Secondary | ICD-10-CM | POA: Diagnosis not present

## 2021-06-29 DIAGNOSIS — M9901 Segmental and somatic dysfunction of cervical region: Secondary | ICD-10-CM | POA: Diagnosis not present

## 2021-06-29 DIAGNOSIS — M9904 Segmental and somatic dysfunction of sacral region: Secondary | ICD-10-CM | POA: Diagnosis not present

## 2021-06-29 DIAGNOSIS — M542 Cervicalgia: Secondary | ICD-10-CM | POA: Diagnosis not present

## 2021-06-29 DIAGNOSIS — M9903 Segmental and somatic dysfunction of lumbar region: Secondary | ICD-10-CM | POA: Diagnosis not present

## 2021-07-01 DIAGNOSIS — M5451 Vertebrogenic low back pain: Secondary | ICD-10-CM | POA: Diagnosis not present

## 2021-07-01 DIAGNOSIS — M542 Cervicalgia: Secondary | ICD-10-CM | POA: Diagnosis not present

## 2021-07-01 DIAGNOSIS — M461 Sacroiliitis, not elsewhere classified: Secondary | ICD-10-CM | POA: Diagnosis not present

## 2021-07-01 DIAGNOSIS — M9903 Segmental and somatic dysfunction of lumbar region: Secondary | ICD-10-CM | POA: Diagnosis not present

## 2021-07-01 DIAGNOSIS — M9901 Segmental and somatic dysfunction of cervical region: Secondary | ICD-10-CM | POA: Diagnosis not present

## 2021-07-01 DIAGNOSIS — M9904 Segmental and somatic dysfunction of sacral region: Secondary | ICD-10-CM | POA: Diagnosis not present

## 2021-07-05 ENCOUNTER — Other Ambulatory Visit: Payer: Self-pay

## 2021-07-05 DIAGNOSIS — F5105 Insomnia due to other mental disorder: Secondary | ICD-10-CM | POA: Diagnosis not present

## 2021-07-05 DIAGNOSIS — F4311 Post-traumatic stress disorder, acute: Secondary | ICD-10-CM | POA: Diagnosis not present

## 2021-07-05 DIAGNOSIS — F330 Major depressive disorder, recurrent, mild: Secondary | ICD-10-CM | POA: Diagnosis not present

## 2021-07-05 DIAGNOSIS — F411 Generalized anxiety disorder: Secondary | ICD-10-CM | POA: Diagnosis not present

## 2021-07-05 MED ORDER — SERTRALINE HCL 50 MG PO TABS
ORAL_TABLET | ORAL | 1 refills | Status: AC
  Filled 2021-07-05: qty 90, 90d supply, fill #0

## 2021-07-05 MED ORDER — TRAZODONE HCL 50 MG PO TABS
ORAL_TABLET | ORAL | 1 refills | Status: AC
  Filled 2021-07-05: qty 90, 90d supply, fill #0

## 2021-07-05 MED ORDER — DULOXETINE HCL 60 MG PO CPEP
ORAL_CAPSULE | ORAL | 1 refills | Status: DC
  Filled 2021-07-05: qty 90, 90d supply, fill #0

## 2021-07-19 ENCOUNTER — Other Ambulatory Visit: Payer: Self-pay

## 2021-08-20 ENCOUNTER — Other Ambulatory Visit: Payer: Self-pay

## 2021-08-20 DIAGNOSIS — R1011 Right upper quadrant pain: Secondary | ICD-10-CM | POA: Diagnosis not present

## 2021-08-20 DIAGNOSIS — R109 Unspecified abdominal pain: Secondary | ICD-10-CM | POA: Diagnosis not present

## 2021-08-20 DIAGNOSIS — R300 Dysuria: Secondary | ICD-10-CM | POA: Diagnosis not present

## 2021-08-20 DIAGNOSIS — R110 Nausea: Secondary | ICD-10-CM | POA: Diagnosis not present

## 2021-08-20 MED ORDER — HYDROCODONE-ACETAMINOPHEN 5-325 MG PO TABS
ORAL_TABLET | ORAL | 0 refills | Status: DC
  Filled 2021-08-20: qty 12, 4d supply, fill #0

## 2021-08-20 MED ORDER — PROMETHAZINE HCL 12.5 MG PO TABS
ORAL_TABLET | ORAL | 0 refills | Status: DC
  Filled 2021-08-20: qty 20, 7d supply, fill #0

## 2021-08-20 MED ORDER — LEVOFLOXACIN 500 MG PO TABS
ORAL_TABLET | ORAL | 0 refills | Status: DC
  Filled 2021-08-20: qty 7, 7d supply, fill #0

## 2021-09-29 ENCOUNTER — Other Ambulatory Visit: Payer: Self-pay

## 2021-09-29 ENCOUNTER — Ambulatory Visit
Admission: EM | Admit: 2021-09-29 | Discharge: 2021-09-29 | Disposition: A | Discharge status: Home / Self Care | Payer: 59

## 2021-09-29 DIAGNOSIS — Z8614 Personal history of Methicillin resistant Staphylococcus aureus infection: Secondary | ICD-10-CM | POA: Diagnosis not present

## 2021-09-29 DIAGNOSIS — H109 Unspecified conjunctivitis: Secondary | ICD-10-CM | POA: Diagnosis not present

## 2021-09-29 MED ORDER — SULFAMETHOXAZOLE-TRIMETHOPRIM 800-160 MG PO TABS
ORAL_TABLET | ORAL | 0 refills | Status: DC
  Filled 2021-09-29: qty 28, 7d supply, fill #0

## 2021-09-29 MED ORDER — POLYMYXIN B-TRIMETHOPRIM 10000-0.1 UNIT/ML-% OP SOLN
2.0000 [drp] | Freq: Three times a day (TID) | OPHTHALMIC | 0 refills | Status: DC
  Filled 2021-09-29: qty 10, 25d supply, fill #0

## 2021-09-29 NOTE — ED Provider Notes (Signed)
[facility]    CSN: [id number] Arrival date & time: 09/29/21  1024      History   Chief Complaint Chief Complaint  Patient presents with   Eye Pain    HPI Carol Mooney is a 46 y.o. female who presents with L eye tearing, itching and having cloudy matter since yesterday. She noticed a blister like area on the L lateral sclera when she woke up and her lower orbit area was swollen. She does not recall itching it in her sleep, denies pain, but right now feels like an ache. That swelling is gone, but has some left on lower conjunctiva below lower lid still presents. The lower orbit swelling is better. This is not painful or hot. Has hx of having MRSA skin infection on L face which spread to her L orbit and was hospitalized for this a few years ago. Denies injuring her eye.     Past Medical History:  Diagnosis Date   Allergy    Anxiety    Complication of anesthesia    nausea and vomiting   Depression    GERD (gastroesophageal reflux disease)    Hypertension    when having migraines   Meningioma ([place]) 08/14/2017   Meningioma ([place])    frontal lobe   PONV (postoperative nausea and vomiting)     Patient Active Problem List   Diagnosis Date Noted   History of alcohol use disorder 01/07/2020   Intractable chronic migraine without aura and without status migrainosus 01/07/2020   Lateral epicondylitis of right elbow 07/17/2019   Meningioma ([place]) 08/14/2017   Depression, major, recurrent, moderate ([place]) 03/22/2017   History of MRSA infection 02/11/2016   Endometriosis 09/01/2015    Past Surgical History:  Procedure Laterality Date   ABDOMINAL HYSTERECTOMY     c sections     x 2   COLONOSCOPY WITH PROPOFOL N/A 01/07/2021   Procedure: COLONOSCOPY WITH PROPOFOL;  Surgeon: Jonathon Bellows, MD;  Location: [facility] ENDOSCOPY;  Service: Gastroenterology;  Laterality: N/A;   CORONARY ARTERY BYPASS GRAFT     HYSTERECTOMY ABDOMINAL WITH SALPINGECTOMY Bilateral 12/07/2015    Procedure: HYSTERECTOMY ABDOMINAL WITH SALPINGECTOMY/EXCISION OF ENDOMETRIOS;  Surgeon: Boykin Nearing, MD;  Location: [facility] ORS;  Service: Gynecology;  Laterality: Bilateral;   OOPHORECTOMY Bilateral 12/07/2015   Procedure: OOPHORECTOMY;  Surgeon: Boykin Nearing, MD;  Location: [facility] ORS;  Service: Gynecology;  Laterality: Bilateral;   TUBAL LIGATION  04/18/1998   TUMOR EXCISION N/A 03/02/2021   Procedure: TUMOR EXCISION RECTAL, excision polyp;  Surgeon: Jules Husbands, MD;  Location: [facility] ORS;  Service: General;  Laterality: N/A;   UTERINE STENT PLACEMENT Bilateral 12/07/2015   Procedure: UTERINE STENT PLACEMENT/REMOVAL;  Surgeon: Boykin Nearing, MD;  Location: [facility] ORS;  Service: Gynecology;  Laterality: Bilateral;    OB History   No obstetric history on file.      Home Medications    Prior to Admission medications   Medication Sig Start Date End Date Taking? Authorizing Provider  DULoxetine (CYMBALTA) 60 MG capsule Take 60 mg by mouth daily.   Yes [provider]  sulfamethoxazole-trimethoprim (BACTRIM DS) 800-160 MG tablet 2 bid x 7 days 09/29/21  Yes Rodriguez-Southworth, Sunday Spillers, PA-C  trimethoprim-polymyxin b (POLYTRIM) ophthalmic solution Place 2 drops into the left eye 3 (three) times daily. For 7 days 09/29/21  Yes Rodriguez-Southworth, Sunday Spillers, PA-C  linaclotide [facility]) 290 MCG CAPS capsule Take 1 capsule (290 mcg total) by mouth daily before breakfast. 01/25/21   Jonathon Bellows,  MD  NONFORMULARY OR COMPOUNDED ITEM Apply 1 application topically in the morning, at noon, and at bedtime. Nifedipine 0.3%    [provider]  sennosides-docusate sodium (SENOKOT-S) 8.6-50 MG tablet Take 1 tablet by mouth daily as needed for constipation.    [provider]  sertraline (ZOLOFT) 50 MG tablet Take 1 daily with breakfast 07/05/21     traZODone (DESYREL) 50 MG tablet Take 1 at bedtime as needed 03/23/21     traZODone (DESYREL) 50 MG tablet Take 1 at  bedtime as needed 07/05/21       Family History Family History  Problem Relation Age of Onset   Coronary artery disease Father    Depression Father    Diabetes Father        insulin dependent   Depression Sister    Depression Brother    Cancer Maternal Grandmother        breast   Breast cancer Maternal Grandmother 62   Breast cancer Paternal Grandmother 82    Social History Social History   Tobacco Use   Smoking status: Former    Packs/day: 1.00    Types: Cigarettes    Quit date: 05/20/2019    Years since quitting: 2.3   Smokeless tobacco: Never  Vaping Use   Vaping Use: Every day   Substances: Nicotine, Flavoring  Substance Use Topics   Alcohol use: Not Currently   Drug use: No     Allergies   Morphine, Morphine and related, and Shellfish allergy   Review of Systems Review of Systems  Constitutional:  Negative for fever.  Eyes:  Positive for discharge, redness and itching. Negative for photophobia, pain and visual disturbance.     Physical Exam Triage Vital Signs ED Triage Vitals  Enc Vitals Group     BP 09/29/21 1037 112/78     Pulse Rate 09/29/21 1037 86     Resp 09/29/21 1037 18     Temp 09/29/21 1037 98.3 F (36.8 C)     Temp Source 09/29/21 1037 Oral     SpO2 09/29/21 1037 99 %     Weight 09/29/21 1032 167 lb (75.8 kg)     Height 09/29/21 1032 '5\' 5"'$  (1.651 m)     Head Circumference --      [place] Flow --      Pain Score 09/29/21 1032 8     Pain Loc --      Pain Edu? --      Excl. in [place]? --    No data found.  Updated Vital Signs BP 112/78 (BP Location: Left Arm)   Pulse 86   Temp 98.3 F (36.8 C) (Oral)   Resp 18   Ht '5\' 5"'$  (1.651 m)   Wt 167 lb (75.8 kg)   LMP 10/11/2015   SpO2 99%   BMI 27.79 kg/m   Visual Acuity Right Eye Distance:   Left Eye Distance:   Bilateral Distance:    Right Eye Near:   Left Eye Near:    Bilateral Near:     Physical Exam Eyes:     General: Allergic shiner present. No scleral icterus.       Left  eye: Discharge present.    Extraocular Movements: Extraocular movements intact.     Conjunctiva/sclera:     Left eye: Left conjunctiva is not injected.     Pupils: Pupils are equal, round, and reactive to light.     Left eye: No corneal abrasion or fluorescein uptake.  Comments: Her lids are a little swollen. Scleras are normal.  Has mild edema of lower conjunctiva below eye lid which she states it looked like this when she saw the lateral " blister" on her L lateral sclera.       UC Treatments / Results  Labs (all labs ordered are listed, but only abnormal results are displayed) Labs Reviewed - No data to display  EKG   Radiology No results found.  Procedures Procedures (including critical care time)  Medications Ordered in UC Medications - No data to display  Initial Impression / Assessment and Plan / UC Course  I have reviewed the triage vital signs and the nursing notes. L conjunctivitis Resolved L lateral sclera swelling, still mild on lower area.  I placed her on Polytrim eye gtts as noted. If the L orbit skin area gets swollen, hot or red she is to start oral Bactrim DS I sent.  Final Clinical Impressions(s) / UC Diagnoses   Final diagnoses:  Conjunctivitis of left eye, unspecified conjunctivitis type  History of MRSA infection     Discharge Instructions      Try the eye drops first, if in 24 hours the redness around your eye gets worse, then start the oral medication     ED Prescriptions     Medication Sig Dispense Auth. Provider   trimethoprim-polymyxin b (POLYTRIM) ophthalmic solution Place 2 drops into the left eye 3 (three) times daily. For 7 days 10 mL Rodriguez-Southworth, Sunday Spillers, PA-C   sulfamethoxazole-trimethoprim (BACTRIM DS) 800-160 MG tablet 2 bid x 7 days 28 tablet Rodriguez-Southworth, Sunday Spillers, PA-C      PDMP not reviewed this encounter.   Shelby Mattocks, PA-C 09/29/21 1101

## 2021-09-29 NOTE — ED Triage Notes (Signed)
Pt reports "blister" on eyeball yesterday. Thinks it popped and has had itching, pain and drainage ever since.   VISUAL ACUITY  Right eye corrected 20/20 Left eye corrected 20/25 Both eyes corrected 20/15 -1

## 2021-09-29 NOTE — Discharge Instructions (Signed)
Try the eye drops first, if in 24 hours the redness around your eye gets worse, then start the oral medication

## 2021-11-12 ENCOUNTER — Other Ambulatory Visit: Payer: Self-pay

## 2021-11-12 DIAGNOSIS — F411 Generalized anxiety disorder: Secondary | ICD-10-CM | POA: Diagnosis not present

## 2021-11-12 DIAGNOSIS — F5105 Insomnia due to other mental disorder: Secondary | ICD-10-CM | POA: Diagnosis not present

## 2021-11-12 DIAGNOSIS — F4312 Post-traumatic stress disorder, chronic: Secondary | ICD-10-CM | POA: Diagnosis not present

## 2021-11-12 DIAGNOSIS — F330 Major depressive disorder, recurrent, mild: Secondary | ICD-10-CM | POA: Diagnosis not present

## 2021-11-12 MED ORDER — DULOXETINE HCL 60 MG PO CPEP
ORAL_CAPSULE | ORAL | 1 refills | Status: DC
Start: 2021-11-12 — End: 2021-12-08
  Filled 2021-11-12: qty 90, 90d supply, fill #0

## 2021-11-12 MED ORDER — SERTRALINE HCL 50 MG PO TABS
ORAL_TABLET | ORAL | 0 refills | Status: AC
  Filled 2021-11-12: qty 90, 90d supply, fill #0

## 2021-11-12 MED ORDER — TRAZODONE HCL 50 MG PO TABS
ORAL_TABLET | ORAL | 1 refills | Status: AC
  Filled 2021-11-12: qty 90, 90d supply, fill #0

## 2021-11-19 ENCOUNTER — Other Ambulatory Visit: Payer: Self-pay

## 2021-12-06 ENCOUNTER — Encounter: Payer: 59 | Admitting: Family Medicine

## 2021-12-06 NOTE — Progress Notes (Unsigned)
I,Jana Modesta Sammons,acting as a Education administrator for [employer], PA-C.,have documented all relevant documentation on the behalf of Mardene Speak, PA-C,as directed by  [employer], PA-C while in the presence of [employer], PA-C.   Complete physical exam   Patient: Carol Mooney   DOB: 07-13-1975   46 y.o. Female  MRN: [id number] Visit Date: 12/07/2021  Today's healthcare provider: Mardene Speak, PA-C   No chief complaint on file.  Subjective    Carol Mooney is a 46 y.o. female who presents today for a complete physical exam.  She reports consuming a {diet types:17450} diet. {Exercise:19826} She generally feels {well/fairly well/poorly:18703}. She reports sleeping {well/fairly well/poorly:18703}. She {does/does not:200015} have additional problems to discuss today.  HPI  ***  Past Medical History:  Diagnosis Date   Allergy    Anxiety    Complication of anesthesia    nausea and vomiting   Depression    GERD (gastroesophageal reflux disease)    Hypertension    when having migraines   Meningioma ([place]) 08/14/2017   Meningioma ([place])    frontal lobe   PONV (postoperative nausea and vomiting)    Past Surgical History:  Procedure Laterality Date   ABDOMINAL HYSTERECTOMY     c sections     x 2   COLONOSCOPY WITH PROPOFOL N/A 01/07/2021   Procedure: COLONOSCOPY WITH PROPOFOL;  Surgeon: Jonathon Bellows, MD;  Location: [facility] ENDOSCOPY;  Service: Gastroenterology;  Laterality: N/A;   CORONARY ARTERY BYPASS GRAFT     HYSTERECTOMY ABDOMINAL WITH SALPINGECTOMY Bilateral 12/07/2015   Procedure: HYSTERECTOMY ABDOMINAL WITH SALPINGECTOMY/EXCISION OF ENDOMETRIOS;  Surgeon: Boykin Nearing, MD;  Location: [facility] ORS;  Service: Gynecology;  Laterality: Bilateral;   OOPHORECTOMY Bilateral 12/07/2015   Procedure: OOPHORECTOMY;  Surgeon: Boykin Nearing, MD;  Location: [facility] ORS;  Service: Gynecology;  Laterality: Bilateral;   TUBAL LIGATION  04/18/1998   TUMOR EXCISION N/A 03/02/2021    Procedure: TUMOR EXCISION RECTAL, excision polyp;  Surgeon: Jules Husbands, MD;  Location: [facility] ORS;  Service: General;  Laterality: N/A;   UTERINE STENT PLACEMENT Bilateral 12/07/2015   Procedure: UTERINE STENT PLACEMENT/REMOVAL;  Surgeon: Boykin Nearing, MD;  Location: [facility] ORS;  Service: Gynecology;  Laterality: Bilateral;   Social History   Socioeconomic History   Marital status: Married    Spouse name: Not on file   Number of children: Not on file   Years of education: Not on file   Highest education level: Not on file  Occupational History   Not on file  Tobacco Use   Smoking status: Former    Packs/day: 1.00    Types: Cigarettes    Quit date: 05/20/2019    Years since quitting: 2.5   Smokeless tobacco: Never  Vaping Use   Vaping Use: Every day   Substances: Nicotine, Flavoring  Substance and Sexual Activity   Alcohol use: Not Currently   Drug use: No   Sexual activity: Yes    Birth control/protection: Other-see comments    Comment: hysterectomy  Other Topics Concern   Not on file  Social History Narrative   Not on file   Social Determinants of Health   Financial Resource Strain: Not on file  Food Insecurity: Not on file  Transportation Needs: Not on file  Physical Activity: Not on file  Stress: Not on file  Social Connections: Not on file  Intimate Partner Violence: Not on file   Family Status  Relation Name Status   Mother  Alive  Father  Deceased   Sister  Alive   Sister  Alive   Brother  Alive   MGM  Alive   MGF  Deceased   PGM  Deceased   PGF  Deceased       CHF   Daughter  Alive   Family History  Problem Relation Age of Onset   Coronary artery disease Father    Depression Father    Diabetes Father        insulin dependent   Depression Sister    Depression Brother    Cancer Maternal Grandmother        breast   Breast cancer Maternal Grandmother 3   Breast cancer Paternal Grandmother 37   Allergies  Allergen Reactions    Morphine Itching   Morphine And Related Itching   Shellfish Allergy Palpitations    Shrimp only - heart racing    Patient Care Team: Virginia Crews, MD as PCP - General (Family Medicine)   Medications: Outpatient Medications Prior to Visit  Medication Sig   DULoxetine (CYMBALTA) 60 MG capsule Take 60 mg by mouth daily.   DULoxetine (CYMBALTA) 60 MG capsule Take 1 daily at 8:00am   linaclotide (LINZESS) 290 MCG CAPS capsule Take 1 capsule (290 mcg total) by mouth daily before breakfast.   NONFORMULARY OR COMPOUNDED ITEM Apply 1 application topically in the morning, at noon, and at bedtime. Nifedipine 0.3%   sennosides-docusate sodium (SENOKOT-S) 8.6-50 MG tablet Take 1 tablet by mouth daily as needed for constipation.   sertraline (ZOLOFT) 50 MG tablet Take 1 daily with breakfast   sertraline (ZOLOFT) 50 MG tablet Take 1 daily with breakfast   sulfamethoxazole-trimethoprim (BACTRIM DS) 800-160 MG tablet 2 bid x 7 days   traZODone (DESYREL) 50 MG tablet Take 1 at bedtime as needed   traZODone (DESYREL) 50 MG tablet Take 1 at bedtime as needed   trimethoprim-polymyxin b (POLYTRIM) ophthalmic solution Place 2 drops into the left eye 3 (three) times daily. For 7 days   No facility-administered medications prior to visit.    Review of Systems  {Labs  Heme  Chem  Endocrine  Serology  Results Review (optional):23779}  Objective    LMP 10/11/2015  {Show previous vital signs (optional):23777}   Physical Exam  ***  Last depression screening scores    12/03/2020    1:25 PM 07/17/2019    8:41 AM 05/17/2017   10:17 AM  PHQ 2/9 Scores  PHQ - 2 Score 0 2 2  PHQ- 9 Score '1 4 6   '$ Last fall risk screening    12/03/2020    1:25 PM  Fall Risk   Falls in the past year? 1  Number falls in past yr: 0  Injury with Fall? 0  Risk for fall due to : No Fall Risks  Follow up Falls evaluation completed   Last Audit-C alcohol use screening    12/03/2020    1:24 PM  Alcohol Use  Disorder Test (AUDIT)  1. How often do you have a drink containing alcohol? 0  2. How many drinks containing alcohol do you have on a typical day when you are drinking? 0  3. How often do you have six or more drinks on one occasion? 0  AUDIT-C Score 0   A score of 3 or more in women, and 4 or more in men indicates increased risk for alcohol abuse, EXCEPT if all of the points are from question 1   No results found  for any visits on 12/07/21.  Assessment & Plan    Routine Health Maintenance and Physical Exam  Exercise Activities and Dietary recommendations  Goals   None     Immunization History  Administered Date(s) Administered   Influenza-Unspecified 01/15/2017, 02/05/2019, 01/17/2020   PFIZER(Purple Top)SARS-COV-2 Vaccination 03/31/2019, 05/01/2019   Tdap 05/17/2017    Health Maintenance  Topic Date Due   COVID-19 Vaccine (3 - Pfizer series) 06/26/2019   INFLUENZA VACCINE  11/16/2021   MAMMOGRAM  12/18/2021   PAP SMEAR-Modifier  07/16/2024   TETANUS/TDAP  05/18/2027   COLONOSCOPY (Pts 45-6yr Insurance coverage will need to be confirmed)  01/08/2028   Hepatitis C Screening  Completed   HIV Screening  Completed   HPV VACCINES  Aged Out    Discussed health benefits of physical activity, and encouraged her to engage in regular exercise appropriate for her age and condition.  ***  No follow-ups on file.     {provider attestation***:1}   JMardene Speak PHershal Coria B[phone](phone) [phone](fax)  [place]

## 2021-12-07 ENCOUNTER — Ambulatory Visit (INDEPENDENT_AMBULATORY_CARE_PROVIDER_SITE_OTHER): Payer: 59 | Admitting: Physician Assistant

## 2021-12-07 ENCOUNTER — Encounter: Payer: Self-pay | Admitting: Physician Assistant

## 2021-12-07 VITALS — BP 117/79 | HR 97 | Temp 97.9°F | Resp 16 | Wt 163.8 lb

## 2021-12-07 DIAGNOSIS — R7989 Other specified abnormal findings of blood chemistry: Secondary | ICD-10-CM

## 2021-12-07 DIAGNOSIS — Z2821 Immunization not carried out because of patient refusal: Secondary | ICD-10-CM | POA: Diagnosis not present

## 2021-12-07 DIAGNOSIS — R5383 Other fatigue: Secondary | ICD-10-CM | POA: Diagnosis not present

## 2021-12-07 DIAGNOSIS — Z1231 Encounter for screening mammogram for malignant neoplasm of breast: Secondary | ICD-10-CM

## 2021-12-07 DIAGNOSIS — E7889 Other lipoprotein metabolism disorders: Secondary | ICD-10-CM

## 2021-12-07 DIAGNOSIS — Z23 Encounter for immunization: Secondary | ICD-10-CM | POA: Diagnosis not present

## 2021-12-07 DIAGNOSIS — R739 Hyperglycemia, unspecified: Secondary | ICD-10-CM

## 2021-12-07 DIAGNOSIS — Z0000 Encounter for general adult medical examination without abnormal findings: Secondary | ICD-10-CM

## 2022-02-01 DIAGNOSIS — H5213 Myopia, bilateral: Secondary | ICD-10-CM | POA: Diagnosis not present

## 2022-02-01 DIAGNOSIS — H43392 Other vitreous opacities, left eye: Secondary | ICD-10-CM | POA: Diagnosis not present

## 2022-02-07 ENCOUNTER — Other Ambulatory Visit: Payer: Self-pay

## 2022-02-07 DIAGNOSIS — F411 Generalized anxiety disorder: Secondary | ICD-10-CM | POA: Diagnosis not present

## 2022-02-07 DIAGNOSIS — F330 Major depressive disorder, recurrent, mild: Secondary | ICD-10-CM | POA: Diagnosis not present

## 2022-02-07 DIAGNOSIS — F4312 Post-traumatic stress disorder, chronic: Secondary | ICD-10-CM | POA: Diagnosis not present

## 2022-02-07 DIAGNOSIS — F5105 Insomnia due to other mental disorder: Secondary | ICD-10-CM | POA: Diagnosis not present

## 2022-02-07 MED ORDER — TRAZODONE HCL 50 MG PO TABS
50.0000 mg | ORAL_TABLET | Freq: Every evening | ORAL | 0 refills | Status: AC | PRN
Start: 2022-02-07 — End: ?
  Filled 2022-02-07: qty 90, 90d supply, fill #0

## 2022-02-07 MED ORDER — DULOXETINE HCL 60 MG PO CPEP
60.0000 mg | ORAL_CAPSULE | Freq: Every day | ORAL | 0 refills | Status: AC
  Filled 2022-02-07: qty 90, 90d supply, fill #0

## 2022-02-25 ENCOUNTER — Other Ambulatory Visit: Payer: Self-pay

## 2022-04-01 ENCOUNTER — Encounter: Payer: Self-pay | Admitting: Family Medicine

## 2022-04-01 ENCOUNTER — Ambulatory Visit: Payer: 59 | Admitting: Family Medicine

## 2022-04-01 VITALS — BP 101/78 | HR 80 | Temp 98.5°F | Resp 16 | Wt 169.1 lb

## 2022-04-01 DIAGNOSIS — J069 Acute upper respiratory infection, unspecified: Secondary | ICD-10-CM | POA: Insufficient documentation

## 2022-04-01 DIAGNOSIS — A084 Viral intestinal infection, unspecified: Secondary | ICD-10-CM | POA: Insufficient documentation

## 2022-04-01 NOTE — Assessment & Plan Note (Signed)
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

## 2022-04-01 NOTE — Progress Notes (Signed)
Fritzi Mandes Wolford,acting as a Education administrator for Gwyneth Sprout, FNP.,have documented all relevant documentation on the behalf of Gwyneth Sprout, FNP,as directed by  Gwyneth Sprout, FNP while in the presence of Gwyneth Sprout, FNP.   Established patient visit   Patient: Carol Mooney   DOB: 1976-01-28   46 y.o. Female  MRN: [id number] Visit Date: 04/01/2022  Today's healthcare provider: Gwyneth Sprout, FNP  Patient presents for new patient visit to establish care.  Introduced to Designer, jewellery role and practice setting.  All questions answered.  Discussed provider/patient relationship and expectations.   Chief Complaint  Patient presents with   URI   Subjective    URI  This is a new problem. The current episode started in the past 7 days. The problem has been gradually worsening. The maximum temperature recorded prior to her arrival was 101 - 101.9 F. The fever has been present for 1 to 2 days. Associated symptoms include congestion, diarrhea, headaches, joint pain, sneezing and a sore throat. Pertinent negatives include no abdominal pain, chest pain, coughing, dysuria, ear pain, joint swelling, nausea, neck pain, plugged ear sensation, rash, rhinorrhea, sinus pain, swollen glands, vomiting or wheezing. She has tried acetaminophen (Theraflu and Nyquil) for the symptoms. The treatment provided mild relief.    Medications: Outpatient Medications Prior to Visit  Medication Sig   DULoxetine (CYMBALTA) 60 MG capsule Take 60 mg by mouth daily.   DULoxetine (CYMBALTA) 60 MG capsule Take 1 capsule (60 mg total) by mouth daily at 8 am.   linaclotide (LINZESS) 290 MCG CAPS capsule Take 1 capsule (290 mcg total) by mouth daily before breakfast.   sertraline (ZOLOFT) 50 MG tablet Take 1 daily with breakfast   sertraline (ZOLOFT) 50 MG tablet Take 1 daily with breakfast   traZODone (DESYREL) 50 MG tablet Take 1 at bedtime as needed   traZODone (DESYREL) 50 MG tablet Take 1 at bedtime as needed    traZODone (DESYREL) 50 MG tablet Take 1 tablet (50 mg total) by mouth at bedtime as needed.   No facility-administered medications prior to visit.    Review of Systems  HENT:  Positive for congestion, sneezing and sore throat. Negative for ear pain, rhinorrhea and sinus pain.   Respiratory:  Negative for cough and wheezing.   Cardiovascular:  Negative for chest pain.  Gastrointestinal:  Positive for diarrhea. Negative for abdominal pain, nausea and vomiting.  Genitourinary:  Negative for dysuria.  Musculoskeletal:  Positive for joint pain. Negative for neck pain.  Skin:  Negative for rash.  Neurological:  Positive for headaches.     Objective    BP 101/78   Pulse 80   Temp 98.5 F (36.9 C) (Oral)   Resp 16   Wt 169 lb 1.6 oz (76.7 kg)   LMP 10/11/2015   SpO2 100%   BMI 28.14 kg/m   Physical Exam Vitals and nursing note reviewed.  Constitutional:      General: She is not in acute distress.    Appearance: Normal appearance. She is overweight. She is not ill-appearing, toxic-appearing or diaphoretic.  HENT:     Head: Normocephalic and atraumatic.  Cardiovascular:     Rate and Rhythm: Normal rate and regular rhythm.     Pulses: Normal pulses.     Heart sounds: Normal heart sounds. No murmur heard.    No friction rub. No gallop.  Pulmonary:     Effort: Pulmonary effort is normal. No respiratory distress.  Breath sounds: Normal breath sounds. No stridor. No wheezing, rhonchi or rales.  Chest:     Chest wall: No tenderness.  Abdominal:     General: Bowel sounds are normal.     Palpations: Abdomen is soft.  Musculoskeletal:        General: No swelling, tenderness, deformity or signs of injury. Normal range of motion.     Right lower leg: No edema.     Left lower leg: No edema.  Skin:    General: Skin is warm and dry.     Capillary Refill: Capillary refill takes less than 2 seconds.     Coloration: Skin is not jaundiced or pale.     Findings: No bruising, erythema,  lesion or rash.  Neurological:     General: No focal deficit present.     Mental Status: She is alert and oriented to person, place, and time. Mental status is at baseline.     Cranial Nerves: No cranial nerve deficit.     Sensory: No sensory deficit.     Motor: No weakness.     Coordination: Coordination normal.  Psychiatric:        Mood and Affect: Mood normal.        Behavior: Behavior normal.        Thought Content: Thought content normal.        Judgment: Judgment normal.     No results found for any visits on 04/01/22.  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     Vonna Kotyk, FNP, have reviewed all documentation for this visit. The documentation on 04/01/22 for the exam, diagnosis, procedures, and orders are all accurate and complete.  Gwyneth Sprout, [place] [phone] (phone) [phone] (fax)  [place]

## 2022-04-01 NOTE — Assessment & Plan Note (Signed)
7 days symptoms of congestion, sneezing, throat irritation and headaches Exposure to flu and covid; defer testing given outside of benefit from viral window Normal exam

## 2022-07-18 ENCOUNTER — Emergency Department
Admission: EM | Admit: 2022-07-18 | Discharge: 2022-07-18 | Disposition: A | Discharge status: Home / Self Care | Payer: 59 | Attending: Emergency Medicine | Admitting: Emergency Medicine

## 2022-07-18 ENCOUNTER — Other Ambulatory Visit: Payer: Self-pay

## 2022-07-18 ENCOUNTER — Emergency Department: Payer: 59

## 2022-07-18 DIAGNOSIS — M545 Low back pain, unspecified: Secondary | ICD-10-CM | POA: Diagnosis present

## 2022-07-18 DIAGNOSIS — N12 Tubulo-interstitial nephritis, not specified as acute or chronic: Secondary | ICD-10-CM | POA: Insufficient documentation

## 2022-07-18 DIAGNOSIS — N390 Urinary tract infection, site not specified: Secondary | ICD-10-CM | POA: Insufficient documentation

## 2022-07-18 LAB — CBC WITH DIFFERENTIAL/PLATELET
Abs Immature Granulocytes: 0.02 10*3/uL (ref 0.00–0.07)
Basophils Absolute: 0 10*3/uL (ref 0.0–0.1)
Basophils Relative: 1 %
Eosinophils Absolute: 0.1 10*3/uL (ref 0.0–0.5)
Eosinophils Relative: 2 %
HCT: 40.2 % (ref 36.0–46.0)
Hemoglobin: 13.2 g/dL (ref 12.0–15.0)
Immature Granulocytes: 0 %
Lymphocytes Relative: 20 %
Lymphs Abs: 1.5 10*3/uL (ref 0.7–4.0)
MCH: 31.2 pg (ref 26.0–34.0)
MCHC: 32.8 g/dL (ref 30.0–36.0)
MCV: 95 fL (ref 80.0–100.0)
Monocytes Absolute: 0.8 10*3/uL (ref 0.1–1.0)
Monocytes Relative: 10 %
Neutro Abs: 5.2 10*3/uL (ref 1.7–7.7)
Neutrophils Relative %: 67 %
Platelets: 307 10*3/uL (ref 150–400)
RBC: 4.23 MIL/uL (ref 3.87–5.11)
RDW: 11.6 % (ref 11.5–15.5)
WBC: 7.6 10*3/uL (ref 4.0–10.5)
nRBC: 0 % (ref 0.0–0.2)

## 2022-07-18 LAB — COMPREHENSIVE METABOLIC PANEL
ALT: 10 U/L (ref 0–44)
AST: 15 U/L (ref 15–41)
Albumin: 4.1 g/dL (ref 3.5–5.0)
Alkaline Phosphatase: 57 U/L (ref 38–126)
Anion gap: 10 (ref 5–15)
BUN: 11 mg/dL (ref 6–20)
CO2: 27 mmol/L (ref 22–32)
Calcium: 9.3 mg/dL (ref 8.9–10.3)
Chloride: 101 mmol/L (ref 98–111)
Creatinine, Ser: 0.57 mg/dL (ref 0.44–1.00)
GFR, Estimated: >60 mL/min (ref >60)
Glucose, Bld: 106 mg/dL — ABNORMAL HIGH (ref 70–99)
Potassium: 3.8 mmol/L (ref 3.5–5.1)
Sodium: 138 mmol/L (ref 135–145)
Total Bilirubin: 0.5 mg/dL (ref 0.3–1.2)
Total Protein: 7.4 g/dL (ref 6.5–8.1)

## 2022-07-18 LAB — LACTIC ACID, PLASMA: Lactic Acid, Venous: 0.9 mmol/L (ref 0.5–1.9)

## 2022-07-18 MED ORDER — ONDANSETRON HCL 4 MG/2ML IJ SOLN
4.0000 mg | Freq: Once | INTRAMUSCULAR | Status: AC
  Administered 2022-07-18: 4 mg via INTRAVENOUS
  Filled 2022-07-18: qty 2

## 2022-07-18 MED ORDER — HYDROMORPHONE HCL 1 MG/ML IJ SOLN
0.5000 mg | Freq: Once | INTRAMUSCULAR | Status: AC
  Administered 2022-07-18: 0.5 mg via INTRAVENOUS
  Filled 2022-07-18: qty 0.5

## 2022-07-18 MED ORDER — SODIUM CHLORIDE 0.9 % IV SOLN
2.0000 g | Freq: Once | INTRAVENOUS | Status: AC
  Administered 2022-07-18: 2 g via INTRAVENOUS
  Filled 2022-07-18: qty 20

## 2022-07-18 MED ORDER — CEFDINIR 300 MG PO CAPS
300.0000 mg | ORAL_CAPSULE | Freq: Two times a day (BID) | ORAL | 0 refills | Status: AC
  Filled 2022-07-18: qty 14, 7d supply, fill #0

## 2022-07-18 MED ORDER — SODIUM CHLORIDE 0.9 % IV BOLUS
1000.0000 mL | Freq: Once | INTRAVENOUS | Status: AC
  Administered 2022-07-18: 1000 mL via INTRAVENOUS

## 2022-07-18 MED ORDER — HYDROCODONE-ACETAMINOPHEN 5-325 MG PO TABS
1.0000 | ORAL_TABLET | Freq: Four times a day (QID) | ORAL | 0 refills | Status: AC | PRN
  Filled 2022-07-18: qty 15, 3d supply, fill #0

## 2022-07-18 MED ORDER — KETOROLAC TROMETHAMINE 30 MG/ML IJ SOLN
15.0000 mg | Freq: Once | INTRAMUSCULAR | Status: AC
  Administered 2022-07-18: 15 mg via INTRAVENOUS
  Filled 2022-07-18: qty 1

## 2022-07-18 MED ORDER — ONDANSETRON 4 MG PO TBDP
4.0000 mg | ORAL_TABLET | Freq: Three times a day (TID) | ORAL | 0 refills | Status: AC | PRN
  Filled 2022-07-18: qty 20, 7d supply, fill #0

## 2022-07-18 NOTE — ED Provider Notes (Signed)
[facility] Provider Note    Event Date/Time   First MD Initiated Contact with Patient 07/18/22 1035     (approximate)   History   Back Pain   HPI  Carol Mooney is a 47 y.o. female here with back pain.  The patient states that she was recently diagnosed with a UTI several days ago.  She was given a dose of Rocephin and put on Macrobid.  She has had worsening symptoms despite this for 2 days.  The pain is aching, throbbing, located primarily in her lower back.  Of note, she did have several days of urinary symptoms about a week ago for which she took 3 to 4 days of old antibiotics with transient relief.  She has history of kidney stones but states this does not necessarily feel like it.  No other complaints.     Physical Exam   Triage Vital Signs: ED Triage Vitals  Enc Vitals Group     BP 07/18/22 1030 118/78     Pulse Rate 07/18/22 1030 (!) 106     Resp 07/18/22 1030 18     Temp 07/18/22 1030 98 F (36.7 C)     Temp Source 07/18/22 1030 Oral     SpO2 07/18/22 1030 99 %     Weight 07/18/22 1031 170 lb (77.1 kg)     Height 07/18/22 1031 5\' 5"  (1.651 m)     Head Circumference --      Peak Flow --      Pain Score 07/18/22 1031 7     Pain Loc --      Pain Edu? --      Excl. in [place]? --     Most recent vital signs: Vitals:   07/18/22 1200 07/18/22 1201  BP: (!) 108/59   Pulse: 80   Resp: 17   Temp:    SpO2: 97% 98%     General: Awake, no distress.  CV:  Good peripheral perfusion.  Regular rate and rhythm.  No murmurs. Resp:  Normal work of breathing.  Abd:  No distention.  Mild suprapubic tenderness.  Mild CVA tenderness, worse in the right greater than left. Other:  Nontoxic.  Moist mucous membranes.   ED Results / Procedures / Treatments   Labs (all labs ordered are listed, but only abnormal results are displayed) Labs Reviewed  COMPREHENSIVE METABOLIC PANEL - Abnormal; Notable for the following components:      Result Value    Glucose, Bld 106 (*)    All other components within normal limits  CULTURE, BLOOD (ROUTINE X 2)  CULTURE, BLOOD (ROUTINE X 2)  CBC WITH DIFFERENTIAL/PLATELET  LACTIC ACID, PLASMA  LACTIC ACID, PLASMA     EKG    RADIOLOGY CT stone: Possible right-sided [place], no other abnormality   I also independently reviewed and agree with radiologist interpretations.   PROCEDURES:  Critical Care performed: No   MEDICATIONS ORDERED IN ED: Medications  cefTRIAXone (ROCEPHIN) 2 g in sodium chloride 0.9 % 100 mL IVPB (0 g Intravenous Stopped 07/18/22 1233)  sodium chloride 0.9 % bolus 1,000 mL (1,000 mLs Intravenous New Bag/Given 07/18/22 1156)  ketorolac (TORADOL) 30 MG/ML injection 15 mg (15 mg Intravenous Given 07/18/22 1122)  ondansetron (ZOFRAN) injection 4 mg (4 mg Intravenous Given 07/18/22 1121)  HYDROmorphone (DILAUDID) injection 0.5 mg (0.5 mg Intravenous Given 07/18/22 1123)     IMPRESSION / MDM / ASSESSMENT AND PLAN / ED COURSE  I reviewed the triage  vital signs and the nursing notes.                              Differential diagnosis includes, but is not limited to, pyelo, UTI, renal colic, infected stone, ovarian cyst, enteritis, diverticulitis  Patient's presentation is most consistent with acute presentation with potential threat to life or bodily function.  47 year old female here with flank pain, particularly right flank pain and lower back pain, in the setting of UTI.  Suspect partially treated pyelonephritis without appropriate treatment due to being on Macrobid.  Patient is afebrile and hemodynamically stable without evidence of severe sepsis.  Lactic acid is normal.  White counts normal.  Renal functions normal.  CT stone study obtained, reviewed, shows no evidence of retention or stone.  Will place on cefdinir, and she was given a dose of IV antibiotics here.  She feels better and starting p.o.  We agreed course of analgesia and antiemetics as well.     FINAL CLINICAL  IMPRESSION(S) / ED DIAGNOSES   Final diagnoses:  Pyelonephritis  Lower urinary tract infectious disease     Rx / DC Orders   ED Discharge Orders          Ordered    cefdinir (OMNICEF) 300 MG capsule  2 times daily        07/18/22 1240    ondansetron (ZOFRAN-ODT) 4 MG disintegrating tablet  Every 8 hours PRN        07/18/22 1240    HYDROcodone-acetaminophen (NORCO/VICODIN) 5-325 MG tablet  Every 6 hours PRN        07/18/22 1240             Note:  This document was prepared using Dragon voice recognition software and may include unintentional dictation errors.   Duffy Bruce, MD 07/18/22 1242

## 2022-07-18 NOTE — ED Notes (Addendum)
Pt dx with UTI Saturday night; 1 shot of rocephin and been on Macrobid since per pt; states no improvement. HA and lower bilateral back pain. Burning upon urination and only able to urinate small amounts initially; pt states both of these has improved but that "the pain is just ridiculous".

## 2022-07-18 NOTE — ED Triage Notes (Signed)
Pt to ED via POV from home. Pt reports she was dx with UTI at [facility] and received shot of rocephin and placed on PO antibiotics. Pt reports she was told to come to ER if symptoms did not improved. Pt states ongoing bilateral back pain, increased urinary frequency and pain with urination.

## 2022-07-18 NOTE — ED Notes (Signed)
Pt given cup of water with verbal okay from [place]. Stretcher locked low, rail up and call bell within reach.

## 2022-07-18 NOTE — ED Notes (Signed)
Pt leaving for CT; will hang rocephin and fluids once pt back to room. Pt given 2 warm blankets as requested.

## 2022-07-23 LAB — CULTURE, BLOOD (ROUTINE X 2)
Culture: NO GROWTH
Culture: NO GROWTH
Special Requests: ADEQUATE
Special Requests: ADEQUATE

## 2022-12-09 ENCOUNTER — Encounter: Payer: Self-pay | Admitting: Family Medicine

## 2022-12-09 ENCOUNTER — Encounter: Payer: 59 | Admitting: Family Medicine

## 2022-12-09 DIAGNOSIS — E782 Mixed hyperlipidemia: Secondary | ICD-10-CM | POA: Insufficient documentation

## 2022-12-09 DIAGNOSIS — R7303 Prediabetes: Secondary | ICD-10-CM | POA: Insufficient documentation

## 2022-12-09 NOTE — Progress Notes (Deleted)
Complete physical exam  Patient: Carol Mooney   DOB: Dec 06, 1975   47 y.o. Female  MRN: [id number]  Subjective:    No chief complaint on file.   Carol Mooney is a 47 y.o. female who presents today for a complete physical exam. She reports consuming a {diet types:17450} diet. {types:19826} She generally feels {DESC; WELL/FAIRLY WELL/POORLY:18703}. She reports sleeping {DESC; WELL/FAIRLY WELL/POORLY:18703}. She {does/does not:200015} have additional problems to discuss today.   Discussed the use of AI scribe software for clinical note transcription with the patient, who gave verbal consent to proceed.  History of Present Illness            Most recent fall risk assessment:    12/07/2021    9:40 AM  Fall Risk   Falls in the past year? 0  Number falls in past yr: 0  Injury with Fall? 0  Follow up Falls evaluation completed     Most recent depression screenings:    12/07/2021    9:40 AM 12/03/2020    1:25 PM  PHQ 2/9 Scores  PHQ - 2 Score 0 0  PHQ- 9 Score 3 1    {VISON DENTAL STD PSA (Optional):27386}  {History (Optional):23778}  Patient Care Team: Erasmo Downer, MD as PCP - General (Family Medicine)   Outpatient Medications Prior to Visit  Medication Sig   DULoxetine (CYMBALTA) 60 MG capsule Take 60 mg by mouth daily.   DULoxetine (CYMBALTA) 60 MG capsule Take 1 capsule (60 mg total) by mouth daily at 8 am.   HYDROcodone-acetaminophen (NORCO/VICODIN) 5-325 MG tablet Take 1-2 tablets by mouth every 6 (six) hours as needed for moderate pain or severe pain (no more than 6 tabs daily).   linaclotide (LINZESS) 290 MCG CAPS capsule Take 1 capsule (290 mcg total) by mouth daily before breakfast.   ondansetron (ZOFRAN-ODT) 4 MG disintegrating tablet Take 1 tablet (4 mg total) by mouth every 8 (eight) hours as needed for nausea or vomiting.   sertraline (ZOLOFT) 50 MG tablet Take 1 daily with breakfast   sertraline (ZOLOFT) 50 MG tablet Take 1 daily with breakfast    traZODone (DESYREL) 50 MG tablet Take 1 at bedtime as needed   traZODone (DESYREL) 50 MG tablet Take 1 at bedtime as needed   traZODone (DESYREL) 50 MG tablet Take 1 tablet (50 mg total) by mouth at bedtime as needed.   No facility-administered medications prior to visit.    ROS        Objective:     LMP 10/11/2015  {Vitals History (Optional):23777}  Physical Exam   No results found for any visits on 12/09/22. {Show previous labs (optional):23779}    Assessment & Plan:    Routine Health Maintenance and Physical Exam  Immunization History  Administered Date(s) Administered   Influenza-Unspecified 01/15/2017, 02/05/2019, 01/17/2020   PFIZER(Purple Top)SARS-COV-2 Vaccination 03/31/2019, 05/01/2019   Tdap 05/17/2017    Health Maintenance  Topic Date Due   COVID-19 Vaccine (3 - 2023-24 season) 12/17/2021   MAMMOGRAM  12/18/2021   INFLUENZA VACCINE  11/17/2022   PAP SMEAR-Modifier  07/16/2024   DTaP/Tdap/Td (2 - Td or Tdap) 05/18/2027   Colonoscopy  01/08/2028   Hepatitis C Screening  Completed   HIV Screening  Completed   HPV VACCINES  Aged Out    Discussed health benefits of physical activity, and encouraged her to engage in regular exercise appropriate for her age and condition.  Problem List Items Addressed This Visit   None  No follow-ups on file.     Shirlee Latch, MD

## 2024-05-24 ENCOUNTER — Emergency Department
Admission: EM | Admit: 2024-05-24 | Discharge: 2024-05-24 | Disposition: A | Discharge status: Home / Self Care | Source: Home / Self Care | Attending: Emergency Medicine | Admitting: Emergency Medicine

## 2024-05-24 ENCOUNTER — Other Ambulatory Visit: Payer: Self-pay

## 2024-05-24 ENCOUNTER — Encounter: Payer: Self-pay | Admitting: Intensive Care

## 2024-05-24 DIAGNOSIS — H5789 Other specified disorders of eye and adnexa: Secondary | ICD-10-CM

## 2024-05-24 MED ORDER — ACETAMINOPHEN 325 MG PO TABS
650.0000 mg | ORAL_TABLET | Freq: Once | ORAL | Status: AC
  Administered 2024-05-24: 650 mg via ORAL
  Filled 2024-05-24: qty 2

## 2024-05-24 MED ORDER — TETRACAINE HCL 0.5 % OP SOLN
1.0000 [drp] | Freq: Once | OPHTHALMIC | Status: AC
  Administered 2024-05-24: 1 [drp] via OPHTHALMIC
  Filled 2024-05-24: qty 4

## 2024-05-24 MED ORDER — PREDNISOLONE ACETATE 1 % OP SUSP: 1.0000 [drp] | Freq: Four times a day (QID) | OPHTHALMIC | 0 refills | Status: AC

## 2024-05-24 MED ORDER — KETOROLAC TROMETHAMINE 30 MG/ML IJ SOLN
30.0000 mg | Freq: Once | INTRAMUSCULAR | Status: AC
  Administered 2024-05-24: 30 mg via INTRAMUSCULAR
  Filled 2024-05-24: qty 1

## 2024-05-24 MED ORDER — FLUORESCEIN SODIUM 1 MG OP STRP
1.0000 | ORAL_STRIP | Freq: Once | OPHTHALMIC | Status: AC
  Administered 2024-05-24: 1 via OPHTHALMIC
  Filled 2024-05-24: qty 1

## 2024-05-24 NOTE — Discharge Instructions (Signed)
 Please follow up with Ophthalmology in 1 week. Call an schedule an appointment with Dr Mittie. In the interim, Prednisolone  eye drops 4 x day and OTC antihistamine eyedrops etc. Pataday (olopatadine), Zaditor (ketotifen), and Alaway (ketotifen)

## 2024-05-24 NOTE — ED Triage Notes (Signed)
 Patient presents with bilateral eye swelling and redness. Reports there were water blisters on her eyes that now feel like they have popped.   Reports she feels like there is pressure behind eyes and blurry vision. Also c/o headache. Reports severe light sensitivity   Reports all symptoms started this AM

## 2024-05-24 NOTE — ED Triage Notes (Signed)
 First nurse note: pt to ED [facility] for bilateral eye swelling started this am with +h/a. Took benadryl  0900

## 2024-05-24 NOTE — ED Provider Notes (Cosign Needed)
 "   [facility] Emergency Department Provider Note     Event Date/Time   First MD Initiated Contact with Patient 05/24/24 1328     (approximate)   History   No chief complaint on file.   HPI  Carol Mooney is a 49 y.o. female with past medical history of meningioma, anxiety, HTN presents to the ED for evaluation of bilateral eye redness, itching and swelling with onset of this morning.  States there are fluid-filled blisters in both eyes and in the right eye the blister had popped before ED arrival. she denies pain with eye movement.  No recent fevers or illness.  She does endorse bilateral blurry vision and a headache that is localized behind her eyes.  She took Benadryl  with no relief.  Denies trauma.  Patient does not wear contact lenses.  Chart reviewed -patient had similar presentation with left eye blister in 2023 at [facility]  Last office visit today at [facility] - advised ED evaluation.      Physical Exam   Triage Vital Signs: ED Triage Vitals  Encounter Vitals Group     BP 05/24/24 1116 133/76     Girls Systolic BP Percentile --      Girls Diastolic BP Percentile --      Boys Systolic BP Percentile --      Boys Diastolic BP Percentile --      Pulse Rate 05/24/24 1116 71     Resp 05/24/24 1116 16     Temp 05/24/24 1116 98 F (36.7 C)     Temp Source 05/24/24 1116 Oral     SpO2 05/24/24 1116 100 %     Weight 05/24/24 1129 197 lb (89.4 kg)     Height 05/24/24 1129 5' 5 (1.651 m)     Head Circumference --      Peak Flow --      Pain Score 05/24/24 1129 9     Pain Loc --      Pain Education --      Exclude from Growth Chart --     Most recent vital signs: Vitals:   05/24/24 1116 05/24/24 1617  BP: 133/76 132/76  Pulse: 71 70  Resp: 16 18  Temp: 98 F (36.7 C) 98 F (36.7 C)  SpO2: 100% 100%    General Awake, no distress.  HEENT NCAT.  CV:  Good peripheral perfusion.  RESP:  Normal effort.  ABD:  No distention.  Other:  pH 7/8 ; No  corneal abrasion on fluorescein  uptake. IOP - R: 12 L:16 Nontender with ocular movement. PERRLA. Normal neuro exam.         ED Results / Procedures / Treatments   Labs (all labs ordered are listed, but only abnormal results are displayed) Labs Reviewed - No data to display   No results found.  PROCEDURES:  Critical Care performed: No  Procedures   MEDICATIONS ORDERED IN ED: Medications  tetracaine  (PONTOCAINE) 0.5 % ophthalmic solution 1-2 drop (1 drop Right Eye Given 05/24/24 1429)  ketorolac  (TORADOL ) 30 MG/ML injection 30 mg (30 mg Intramuscular Given 05/24/24 1442)  acetaminophen  (TYLENOL ) tablet 650 mg (650 mg Oral Given 05/24/24 1441)  fluorescein  ophthalmic strip 1 strip (1 strip Right Eye Given by Other 05/24/24 1444)     IMPRESSION / MDM / ASSESSMENT AND PLAN / ED COURSE  I reviewed the triage vital signs and the nursing notes.  Clinical Course as of 05/24/24 1630  Fri May 24, 2024  1602 D/w dr mittie -ophthalmology.  Will follow-up in office next week.  Prednisolone  eyedrops 4 times a day and over-the-counter antihistamine eyedrops. [MH]    Clinical Course User Index [MH] Margrette Monte A, PA-C    49 y.o. female presents to the emergency department for evaluation and treatment of acute bilateral eye swelling and redness. See HPI for further details.   Differential diagnosis includes, but is not limited to foreign body, conjunctivitis, allergic reaction  Patient's presentation is most consistent with acute complicated illness / injury requiring diagnostic workup.  Patient is alert and oriented.  She is hemodynamic stable.  Physical exam findings are as stated above including media.  Normal IOP, pH and fluorescein  stain eye exam.  Ophthalmology was consulted.  Presentation most consistent with an allergic conjunctivitis with associated chemosis. Please see clinical course note.  Patient is in stable condition for discharge home.   She is advised to follow-up with ophthalmology in 1 week.  Prescription for prednisone  eyedrops sent to pharmacy.  ED return precaution discussed.  FINAL CLINICAL IMPRESSION(S) / ED DIAGNOSES   Final diagnoses:  Eye swelling, bilateral   Rx / DC Orders   ED Discharge Orders          Ordered    prednisoLONE  acetate (PRED FORTE ) 1 % ophthalmic suspension  4 times daily        05/24/24 1608             Note:  This document was prepared using Dragon voice recognition software and may include unintentional dictation errors.    Margrette Monte A, PA-C 05/24/24 1631  "
